# Patient Record
Sex: Female | Born: 1990 | Race: White | Hispanic: No | Marital: Married | Smoking: Former smoker
Health system: Southern US, Community
[De-identification: ages and names within clinical notes are randomized; demographics above are authoritative.]

## PROBLEM LIST (undated history)

## (undated) DIAGNOSIS — M3500 Sicca syndrome, unspecified: Secondary | ICD-10-CM

## (undated) DIAGNOSIS — N200 Calculus of kidney: Secondary | ICD-10-CM

## (undated) DIAGNOSIS — F429 Obsessive-compulsive disorder, unspecified: Secondary | ICD-10-CM

## (undated) DIAGNOSIS — K589 Irritable bowel syndrome without diarrhea: Secondary | ICD-10-CM

## (undated) DIAGNOSIS — E039 Hypothyroidism, unspecified: Secondary | ICD-10-CM

## (undated) DIAGNOSIS — R87629 Unspecified abnormal cytological findings in specimens from vagina: Secondary | ICD-10-CM

## (undated) DIAGNOSIS — N390 Urinary tract infection, site not specified: Secondary | ICD-10-CM

## (undated) DIAGNOSIS — R519 Headache, unspecified: Secondary | ICD-10-CM

## (undated) DIAGNOSIS — K2970 Gastritis, unspecified, without bleeding: Secondary | ICD-10-CM

## (undated) DIAGNOSIS — H539 Unspecified visual disturbance: Secondary | ICD-10-CM

## (undated) DIAGNOSIS — K219 Gastro-esophageal reflux disease without esophagitis: Secondary | ICD-10-CM

## (undated) DIAGNOSIS — R112 Nausea with vomiting, unspecified: Secondary | ICD-10-CM

## (undated) DIAGNOSIS — E079 Disorder of thyroid, unspecified: Secondary | ICD-10-CM

## (undated) DIAGNOSIS — J45909 Unspecified asthma, uncomplicated: Secondary | ICD-10-CM

## (undated) DIAGNOSIS — R51 Headache: Secondary | ICD-10-CM

## (undated) DIAGNOSIS — F419 Anxiety disorder, unspecified: Secondary | ICD-10-CM

## (undated) DIAGNOSIS — O139 Gestational [pregnancy-induced] hypertension without significant proteinuria, unspecified trimester: Secondary | ICD-10-CM

## (undated) DIAGNOSIS — Z9889 Other specified postprocedural states: Secondary | ICD-10-CM

## (undated) DIAGNOSIS — Z8489 Family history of other specified conditions: Secondary | ICD-10-CM

## (undated) DIAGNOSIS — D649 Anemia, unspecified: Secondary | ICD-10-CM

## (undated) DIAGNOSIS — K449 Diaphragmatic hernia without obstruction or gangrene: Secondary | ICD-10-CM

## (undated) DIAGNOSIS — N83209 Unspecified ovarian cyst, unspecified side: Secondary | ICD-10-CM

## (undated) DIAGNOSIS — F509 Eating disorder, unspecified: Secondary | ICD-10-CM

## (undated) DIAGNOSIS — T7840XA Allergy, unspecified, initial encounter: Secondary | ICD-10-CM

## (undated) HISTORY — PX: CERVIX LESION DESTRUCTION: SHX591

## (undated) HISTORY — PX: TONSILLECTOMY: SUR1361

## (undated) HISTORY — PX: ADENOIDECTOMY: SUR15

## (undated) HISTORY — PX: COLONOSCOPY: SHX174

## (undated) HISTORY — PX: KNEE ARTHROSCOPY: SUR90

## (undated) HISTORY — PX: KIDNEY STONE SURGERY: SHX686

## (undated) HISTORY — DX: Gastro-esophageal reflux disease without esophagitis: K21.9

## (undated) HISTORY — DX: Unspecified asthma, uncomplicated: J45.909

## (undated) HISTORY — DX: Other specified postprocedural states: Z98.890

## (undated) HISTORY — DX: Sjogren syndrome, unspecified: M35.00

## (undated) HISTORY — DX: Gastritis, unspecified, without bleeding: K29.70

## (undated) HISTORY — DX: Gestational (pregnancy-induced) hypertension without significant proteinuria, unspecified trimester: O13.9

## (undated) HISTORY — DX: Allergy, unspecified, initial encounter: T78.40XA

## (undated) HISTORY — PX: KNEE SURGERY: SHX244

## (undated) HISTORY — DX: Nausea with vomiting, unspecified: R11.2

## (undated) HISTORY — DX: Irritable bowel syndrome, unspecified: K58.9

## (undated) HISTORY — DX: Headache: R51

## (undated) HISTORY — DX: Unspecified abnormal cytological findings in specimens from vagina: R87.629

## (undated) HISTORY — PX: CYSTOSCOPY: SUR368

## (undated) HISTORY — DX: Family history of other specified conditions: Z84.89

## (undated) HISTORY — DX: Hypothyroidism, unspecified: E03.9

## (undated) HISTORY — DX: Diaphragmatic hernia without obstruction or gangrene: K44.9

## (undated) HISTORY — DX: Eating disorder, unspecified: F50.9

## (undated) HISTORY — DX: Disorder of thyroid, unspecified: E07.9

## (undated) HISTORY — DX: Urinary tract infection, site not specified: N39.0

## (undated) HISTORY — DX: Headache, unspecified: R51.9

## (undated) HISTORY — DX: Unspecified visual disturbance: H53.9

## (undated) HISTORY — DX: Calculus of kidney: N20.0

## (undated) HISTORY — DX: Obsessive-compulsive disorder, unspecified: F42.9

## (undated) HISTORY — DX: Anxiety disorder, unspecified: F41.9

## (undated) HISTORY — PX: ESOPHAGOGASTRODUODENOSCOPY: SHX1529

---
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
## 2014-08-11 ENCOUNTER — Emergency Department (HOSPITAL_COMMUNITY): Payer: BLUE CROSS/BLUE SHIELD

## 2014-08-11 ENCOUNTER — Encounter (HOSPITAL_COMMUNITY): Payer: Self-pay

## 2014-08-11 ENCOUNTER — Emergency Department (HOSPITAL_COMMUNITY)
Admission: EM | Admit: 2014-08-11 | Discharge: 2014-08-12 | Disposition: A | Discharge status: Home / Self Care | Payer: BLUE CROSS/BLUE SHIELD | Attending: Emergency Medicine | Admitting: Emergency Medicine

## 2014-08-11 DIAGNOSIS — Z8742 Personal history of other diseases of the female genital tract: Secondary | ICD-10-CM | POA: Insufficient documentation

## 2014-08-11 DIAGNOSIS — Z9889 Other specified postprocedural states: Secondary | ICD-10-CM | POA: Diagnosis not present

## 2014-08-11 DIAGNOSIS — Z3202 Encounter for pregnancy test, result negative: Secondary | ICD-10-CM | POA: Diagnosis not present

## 2014-08-11 DIAGNOSIS — Z79899 Other long term (current) drug therapy: Secondary | ICD-10-CM | POA: Insufficient documentation

## 2014-08-11 DIAGNOSIS — R102 Pelvic and perineal pain unspecified side: Secondary | ICD-10-CM

## 2014-08-11 DIAGNOSIS — Z720 Tobacco use: Secondary | ICD-10-CM | POA: Insufficient documentation

## 2014-08-11 DIAGNOSIS — N739 Female pelvic inflammatory disease, unspecified: Secondary | ICD-10-CM | POA: Diagnosis not present

## 2014-08-11 DIAGNOSIS — Z792 Long term (current) use of antibiotics: Secondary | ICD-10-CM | POA: Diagnosis not present

## 2014-08-11 DIAGNOSIS — N730 Acute parametritis and pelvic cellulitis: Secondary | ICD-10-CM

## 2014-08-11 HISTORY — DX: Unspecified ovarian cyst, unspecified side: N83.209

## 2014-08-11 LAB — URINE MICROSCOPIC-ADD ON

## 2014-08-11 LAB — CBC WITH DIFFERENTIAL/PLATELET
Basophils Absolute: 0 10*3/uL (ref 0.0–0.1)
Basophils Relative: 0 % (ref 0–1)
Eosinophils Absolute: 0 10*3/uL (ref 0.0–0.7)
Eosinophils Relative: 1 % (ref 0–5)
HCT: 38 % (ref 36.0–46.0)
Hemoglobin: 12.3 g/dL (ref 12.0–15.0)
Lymphocytes Relative: 30 % (ref 12–46)
Lymphs Abs: 1.1 10*3/uL (ref 0.7–4.0)
MCH: 28.2 pg (ref 26.0–34.0)
MCHC: 32.4 g/dL (ref 30.0–36.0)
MCV: 87.2 fL (ref 78.0–100.0)
Monocytes Absolute: 0.5 10*3/uL (ref 0.1–1.0)
Monocytes Relative: 13 % — ABNORMAL HIGH (ref 3–12)
Neutro Abs: 2.1 10*3/uL (ref 1.7–7.7)
Neutrophils Relative %: 56 % (ref 43–77)
Platelets: 213 10*3/uL (ref 150–400)
RBC: 4.36 MIL/uL (ref 3.87–5.11)
RDW: 13 % (ref 11.5–15.5)
WBC: 3.8 10*3/uL — ABNORMAL LOW (ref 4.0–10.5)

## 2014-08-11 LAB — I-STAT CHEM 8, ED
BUN: 12 mg/dL (ref 6–20)
Calcium, Ion: 1.17 mmol/L (ref 1.12–1.23)
Chloride: 104 mmol/L (ref 101–111)
Creatinine, Ser: 0.7 mg/dL (ref 0.44–1.00)
Glucose, Bld: 93 mg/dL (ref 65–99)
HCT: 40 % (ref 36.0–46.0)
Hemoglobin: 13.6 g/dL (ref 12.0–15.0)
Potassium: 3.3 mmol/L — ABNORMAL LOW (ref 3.5–5.1)
Sodium: 139 mmol/L (ref 135–145)
TCO2: 24 mmol/L (ref 0–100)

## 2014-08-11 LAB — WET PREP, GENITAL
Clue Cells Wet Prep HPF POC: NONE SEEN
Trich, Wet Prep: NONE SEEN

## 2014-08-11 LAB — URINALYSIS, ROUTINE W REFLEX MICROSCOPIC
Bilirubin Urine: NEGATIVE
Glucose, UA: NEGATIVE mg/dL
Ketones, ur: NEGATIVE mg/dL
Leukocytes, UA: NEGATIVE
Nitrite: NEGATIVE
Protein, ur: NEGATIVE mg/dL
Specific Gravity, Urine: 1.025 (ref 1.005–1.030)
Urobilinogen, UA: 0.2 mg/dL (ref 0.0–1.0)
pH: 6.5 (ref 5.0–8.0)

## 2014-08-11 LAB — POC URINE PREG, ED: Preg Test, Ur: NEGATIVE

## 2014-08-11 MED ORDER — HYDROMORPHONE HCL 1 MG/ML IJ SOLN
1.0000 mg | Freq: Once | INTRAMUSCULAR | Status: AC
  Administered 2014-08-11: 1 mg via INTRAMUSCULAR
  Filled 2014-08-11: qty 1

## 2014-08-11 NOTE — ED Notes (Signed)
Unable to collect labs at this time PA is working with patient.

## 2014-08-11 NOTE — ED Provider Notes (Signed)
CSN: [id number]     Arrival date & time 08/11/14  2031 History   First MD Initiated Contact with Patient 08/11/14 2056     Chief Complaint  Patient presents with  . Pelvic Pain     (Consider location/radiation/quality/duration/timing/severity/associated sxs/prior Treatment) HPI Michelle Waller is a 24 y.o. female with history of ovarian cyst, presents to emergency department complaining of pelvic pain. Patient states that symptoms started approximately 3 weeks ago. Patient states she had a new partner, she admits to having "rough intercourse." She states after that she felt swollen and had pelvic pain. She went to an urgent care and tested positive for chlamydia and was treated with Zithromax 1 g by mouth. Patient states she has also told she had UTI was treated with Cipro. Patient states that a week ago she was getting worse and went back to the urgent care again and was started on doxycycline at that time for possible PID. Patient states since then symptoms are just getting worse. She reports severe pelvic pain, worse on the left. She reports vaginal discharge. She reports vaginal irritation. She also reports urinary frequency and urgency. Denies fever or chills. Admits to nausea and vomiting.  Past Medical History  Diagnosis Date  . Ovarian cyst    Past Surgical History  Procedure Laterality Date  . Adenoidectomy    . Knee surgery    . Colonoscopy    . Cystoscopy     No family history on file. History  Substance Use Topics  . Smoking status: Current Some Day Smoker  . Smokeless tobacco: Not on file  . Alcohol Use: Yes     Comment: occasionally    OB History    No data available     Review of Systems  Constitutional: Negative for fever and chills.  Respiratory: Negative for cough, chest tightness and shortness of breath.   Cardiovascular: Negative for chest pain, palpitations and leg swelling.  Gastrointestinal: Positive for nausea, vomiting and abdominal pain. Negative for  diarrhea.  Genitourinary: Positive for dysuria, urgency, frequency, vaginal discharge, vaginal pain and pelvic pain. Negative for flank pain and vaginal bleeding.  Musculoskeletal: Negative for myalgias, arthralgias, neck pain and neck stiffness.  Skin: Negative for rash.  Neurological: Negative for dizziness, weakness and headaches.  All other systems reviewed and are negative.     Allergies  Medrol  Home Medications   Prior to Admission medications   Medication Sig Start Date End Date Taking? Authorizing Provider  doxycycline (VIBRAMYCIN) 100 MG capsule Take 100 mg by mouth 2 (two) times daily. 14 days 08/05/14 08/19/14 Yes Historical Provider, MD  EPIPEN 2-PAK 0.3 MG/0.3ML SOAJ injection USE SUBCUTANEOUSLY AS DIRECTED 05/28/14  Yes Historical Provider, MD  FLUoxetine (PROZAC) 20 MG capsule Take 20 mg by mouth every evening.   Yes Historical Provider, MD  HYDROcodone-acetaminophen (NORCO/VICODIN) 5-325 MG per tablet Take 0.5-1 tablets by mouth every 6 (six) hours as needed for moderate pain.  08/05/14  Yes Historical Provider, MD  LORazepam (ATIVAN) 0.5 MG tablet Take 0.25-0.5 mg by mouth every 8 (eight) hours as needed for anxiety.   Yes Historical Provider, MD  norgestimate-ethinyl estradiol (ORTHO-CYCLEN,SPRINTEC,PREVIFEM) 0.25-35 MG-MCG tablet Take 1 tablet by mouth every evening.   Yes Historical Provider, MD  omeprazole (PRILOSEC) 20 MG capsule Take 20 mg by mouth daily. 07/25/14  Yes Historical Provider, MD  tamsulosin (FLOMAX) 0.4 MG CAPS capsule Take 0.4 mg by mouth at bedtime as needed (kidney stone).  08/05/14  Yes Historical Provider, MD  BP 156/87 mmHg  Pulse 96  Temp(Src) 98.2 F (36.8 C) (Oral)  Resp 18  SpO2 100%  LMP 07/18/2014 (Approximate) Physical Exam  Constitutional: She appears well-developed and well-nourished. No distress.  HENT:  Head: Normocephalic.  Eyes: Conjunctivae are normal.  Neck: Neck supple.  Cardiovascular: Normal rate, regular rhythm and normal  heart sounds.   Pulmonary/Chest: Effort normal and breath sounds normal. No respiratory distress. She has no wheezes. She has no rales.  Abdominal: Soft. Bowel sounds are normal. She exhibits no distension. There is tenderness. There is no rebound.  Suprapubic tenderness  Genitourinary:  Normal external genitalia. White thick vaginal discharge. Cervix is erythematous, friable, tender to palpation, cervical motion tenderness present. Uterine tenderness present. Left adnexal tenderness. Right adnexa normal.  Musculoskeletal: She exhibits no edema.  Neurological: She is alert.  Skin: Skin is warm and dry.  Psychiatric: She has a normal mood and affect. Her behavior is normal.  Nursing note and vitals reviewed.   ED Course  Procedures (including critical care time) Labs Review Labs Reviewed  WET PREP, GENITAL - Abnormal; Notable for the following:    Yeast Wet Prep HPF POC FEW (*)    WBC, Wet Prep HPF POC MODERATE (*)    All other components within normal limits  CBC WITH DIFFERENTIAL/PLATELET - Abnormal; Notable for the following:    WBC 3.8 (*)    Monocytes Relative 13 (*)    All other components within normal limits  I-STAT CHEM 8, ED - Abnormal; Notable for the following:    Potassium 3.3 (*)    All other components within normal limits  URINALYSIS, ROUTINE W REFLEX MICROSCOPIC (NOT AT [facility])  POC URINE PREG, ED  GC/CHLAMYDIA PROBE AMP ([place]) NOT AT [facility]    Imaging Review US Transvaginal Non-ob  08/11/2014   CLINICAL DATA:  Acute onset of pelvic pain.  Initial encounter.  EXAM: TRANSABDOMINAL AND TRANSVAGINAL ULTRASOUND OF PELVIS  DOPPLER ULTRASOUND OF OVARIES  TECHNIQUE: Both transabdominal and transvaginal ultrasound examinations of the pelvis were performed. Transabdominal technique was performed for global imaging of the pelvis including uterus, ovaries, adnexal regions, and pelvic cul-de-sac.  It was necessary to proceed with endovaginal exam following the transabdominal  exam to visualize the ovaries in greater detail. Color and duplex Doppler ultrasound was utilized to evaluate blood flow to the ovaries.  COMPARISON:  None.  FINDINGS: Uterus  Measurements: 7.6 x 3.1 x 4.6 cm. No fibroids or other mass visualized. There is diffuse prominence of the vasculature within the myometrium and about the cervix.  Endometrium  Thickness: 0.3 cm.  No focal abnormality visualized.  Right ovary  Measurements: 2.3 x 1.8 x 1.5 cm. Normal appearance/no adnexal mass.  Left ovary  Measurements: 4.2 x 2.0 x 2.2 cm. Normal appearance/no adnexal mass.  Pulsed Doppler evaluation of both ovaries demonstrates normal low-resistance arterial and venous waveforms.  Other findings  No free fluid is seen within the pelvic cul-de-sac.  IMPRESSION: 1. No acute abnormality seen within the pelvis. No evidence for ovarian torsion. 2. Diffuse nonspecific prominence of the myometrial and periuterine vasculature.   Electronically Signed   By: Garald Balding M.D.   On: 08/11/2014 23:19   US Pelvis Complete  08/11/2014   CLINICAL DATA:  Acute onset of pelvic pain.  Initial encounter.  EXAM: TRANSABDOMINAL AND TRANSVAGINAL ULTRASOUND OF PELVIS  DOPPLER ULTRASOUND OF OVARIES  TECHNIQUE: Both transabdominal and transvaginal ultrasound examinations of the pelvis were performed. Transabdominal technique was performed for global imaging  of the pelvis including uterus, ovaries, adnexal regions, and pelvic cul-de-sac.  It was necessary to proceed with endovaginal exam following the transabdominal exam to visualize the ovaries in greater detail. Color and duplex Doppler ultrasound was utilized to evaluate blood flow to the ovaries.  COMPARISON:  None.  FINDINGS: Uterus  Measurements: 7.6 x 3.1 x 4.6 cm. No fibroids or other mass visualized. There is diffuse prominence of the vasculature within the myometrium and about the cervix.  Endometrium  Thickness: 0.3 cm.  No focal abnormality visualized.  Right ovary  Measurements: 2.3  x 1.8 x 1.5 cm. Normal appearance/no adnexal mass.  Left ovary  Measurements: 4.2 x 2.0 x 2.2 cm. Normal appearance/no adnexal mass.  Pulsed Doppler evaluation of both ovaries demonstrates normal low-resistance arterial and venous waveforms.  Other findings  No free fluid is seen within the pelvic cul-de-sac.  IMPRESSION: 1. No acute abnormality seen within the pelvis. No evidence for ovarian torsion. 2. Diffuse nonspecific prominence of the myometrial and periuterine vasculature.   Electronically Signed   By: Garald Balding M.D.   On: 08/11/2014 23:19   [place] Art/ven Flow Abd Pelv Doppler  08/11/2014   CLINICAL DATA:  Acute onset of pelvic pain.  Initial encounter.  EXAM: TRANSABDOMINAL AND TRANSVAGINAL ULTRASOUND OF PELVIS  DOPPLER ULTRASOUND OF OVARIES  TECHNIQUE: Both transabdominal and transvaginal ultrasound examinations of the pelvis were performed. Transabdominal technique was performed for global imaging of the pelvis including uterus, ovaries, adnexal regions, and pelvic cul-de-sac.  It was necessary to proceed with endovaginal exam following the transabdominal exam to visualize the ovaries in greater detail. Color and duplex Doppler ultrasound was utilized to evaluate blood flow to the ovaries.  COMPARISON:  None.  FINDINGS: Uterus  Measurements: 7.6 x 3.1 x 4.6 cm. No fibroids or other mass visualized. There is diffuse prominence of the vasculature within the myometrium and about the cervix.  Endometrium  Thickness: 0.3 cm.  No focal abnormality visualized.  Right ovary  Measurements: 2.3 x 1.8 x 1.5 cm. Normal appearance/no adnexal mass.  Left ovary  Measurements: 4.2 x 2.0 x 2.2 cm. Normal appearance/no adnexal mass.  Pulsed Doppler evaluation of both ovaries demonstrates normal low-resistance arterial and venous waveforms.  Other findings  No free fluid is seen within the pelvic cul-de-sac.  IMPRESSION: 1. No acute abnormality seen within the pelvis. No evidence for ovarian torsion. 2. Diffuse  nonspecific prominence of the myometrial and periuterine vasculature.   Electronically Signed   By: Garald Balding M.D.   On: 08/11/2014 23:19     EKG Interpretation None      MDM   Final diagnoses:  Adnexal pain    patient with pelvic pain, vaginal discharge. Currently finished 1 week of doxycycline and was treated with Zithromax for chlamydia prior to that. Patient is afebrile, nontoxic appearing. Vaginal discharge concerning for infection. Will get ultrasound to rule out TOA.  Ultrasound is negative. Patient's urinalysis is negative. Blood unremarkable. Wet prep with ease and many white blood cells. I suspect PID. She states she received Rocephin IM yesterday. She has received another doxycycline prescription which she has not filled via yesterday. We'll continue doxycycline, will add Flagyl, will give pain medication, Percocet. Instructed to follow-up with OB/GYN. Return or go to [facility] of worsening. Patient was resting. She is nontoxic appearing, she is afebrile. No elevation of WBC. No evidence of sepsis. It do not think she needs admission at this time.  Filed Vitals:   08/11/14 2038 08/12/14  0008 08/12/14 0040  BP: 156/87 139/82 137/83  Pulse: 96 64 78  Temp: 98.2 F (36.8 C)  98.2 F (36.8 C)  TempSrc: Oral  Oral  Resp: 18 15 14   SpO2: 100% 98% 100%     Jeannett Senior, PA-C 08/12/14 0106  Gareth Morgan, MD 08/12/14 1530

## 2014-08-11 NOTE — ED Notes (Signed)
Pt presents with c/o pelvic pain. Pt reports that approx 3 weeks ago, she had a new partner and had some rough intercourse. Pt was seen at [facility] because she felt she was swollen and was diagnosed with chlamydia. Pt returned to [facility] because the pain was not any better and they believed she has PID. Pt reports the pain is not any better.

## 2014-08-12 LAB — GC/CHLAMYDIA PROBE AMP (~~LOC~~) NOT AT ARMC
Chlamydia: NEGATIVE
Neisseria Gonorrhea: NEGATIVE

## 2014-08-12 MED ORDER — METRONIDAZOLE 500 MG PO TABS: 500.0000 mg | ORAL_TABLET | Freq: Two times a day (BID) | ORAL | Status: DC

## 2014-08-12 MED ORDER — OXYCODONE-ACETAMINOPHEN 5-325 MG PO TABS: 1.0000 | ORAL_TABLET | ORAL | Status: DC | PRN

## 2014-08-12 MED ORDER — FLUCONAZOLE 150 MG PO TABS
150.0000 mg | ORAL_TABLET | Freq: Once | ORAL | Status: AC
  Administered 2014-08-12: 150 mg via ORAL
  Filled 2014-08-12: qty 1

## 2014-08-12 MED ORDER — FLUCONAZOLE 150 MG PO TABS: 150.0000 mg | ORAL_TABLET | Freq: Every day | ORAL | Status: AC

## 2014-08-12 NOTE — Discharge Instructions (Signed)
Continue doxycycline. Add metronidazole to the treatment and take until all gone. Take percocet as prescribed as needed for severe pain. Please follow up with OB/GYN. Return to [place] if worsening symptoms.   Pelvic Inflammatory Disease Pelvic inflammatory disease (PID) refers to an infection in some or all of the female organs. The infection can be in the uterus, ovaries, fallopian tubes, or the surrounding tissues in the pelvis. PID can cause abdominal or pelvic pain that comes on suddenly (acute pelvic pain). PID is a serious infection because it can lead to lasting (chronic) pelvic pain or the inability to have children (infertile).  CAUSES  The infection is often caused by the normal bacteria found in the vaginal tissues. PID may also be caused by an infection that is spread during sexual contact. PID can also occur following:   The birth of a baby.   A miscarriage.   An abortion.   Major pelvic surgery.   The use of an intrauterine device (IUD).   A sexual assault.  RISK FACTORS Certain factors can put a person at higher risk for PID, such as:  Being younger than 25 years.  Being sexually active at [place] age.  Usingnonbarrier contraception.  Havingmultiple sexual partners.  Having sex with someone who has symptoms of a genital infection.  Using oral contraception. Other times, certain behaviors can increase the possibility of getting PID, such as:  Having sex during your period.  Using a vaginal douche.  Having an intrauterine device (IUD) in place. SYMPTOMS   Abdominal or pelvic pain.   Fever.   Chills.   Abnormal vaginal discharge.  Abnormal uterine bleeding.   Unusual pain shortly after finishing your period. DIAGNOSIS  Your caregiver will choose some of the following methods to make a diagnosis, such as:   Performinga physical exam and history. A pelvic exam typically reveals a very tender uterus and surrounding pelvis.    Ordering laboratory tests including a pregnancy test, blood tests, and urine test.  Orderingcultures of the vagina and cervix to check for a sexually transmitted infection (STI).  Performing an ultrasound.   Performing a laparoscopic procedure to look inside the pelvis.  TREATMENT   Antibiotic medicines may be prescribed and taken by mouth.   Sexual partners may be treated when the infection is caused by a sexually transmitted disease (STD).   Hospitalization may be needed to give antibiotics intravenously.  Surgery may be needed, but this is rare. It may take weeks until you are completely well. If you are diagnosed with PID, you should also be checked for human immunodeficiency virus (HIV). HOME CARE INSTRUCTIONS   If given, take your antibiotics as directed. Finish the medicine even if you start to feel better.   Only take over-the-counter or prescription medicines for pain, discomfort, or fever as directed by your caregiver.   Do not have sexual intercourse until treatment is completed or as directed by your caregiver. If PID is confirmed, your recent sexual partner(s) will need treatment.   Keep your follow-up appointments. SEEK MEDICAL CARE IF:   You have increased or abnormal vaginal discharge.   You need prescription medicine for your pain.   You vomit.   You cannot take your medicines.   Your partner has an STD.  SEEK IMMEDIATE MEDICAL CARE IF:   You have a fever.   You have increased abdominal or pelvic pain.   You have chills.   You have pain when you urinate.   You are not  better after 72 hours following treatment.  MAKE SURE YOU:   Understand these instructions.  Will watch your condition.  Will get help right away if you are not doing well or get worse. Document Released: 01/09/2005 Document Revised: 05/06/2012 Document Reviewed: 01/05/2011 [facility] Patient Information 2015 [place], [place]. This information is not  intended to replace advice given to you by your health care provider. Make sure you discuss any questions you have with your health care provider.

## 2014-08-15 ENCOUNTER — Encounter (HOSPITAL_COMMUNITY): Payer: Self-pay | Admitting: Emergency Medicine

## 2014-08-15 ENCOUNTER — Emergency Department (HOSPITAL_COMMUNITY): Payer: BLUE CROSS/BLUE SHIELD

## 2014-08-15 ENCOUNTER — Emergency Department (HOSPITAL_COMMUNITY)
Admission: EM | Admit: 2014-08-15 | Discharge: 2014-08-16 | Disposition: A | Discharge status: Home / Self Care | Payer: BLUE CROSS/BLUE SHIELD | Attending: Emergency Medicine | Admitting: Emergency Medicine

## 2014-08-15 DIAGNOSIS — Z720 Tobacco use: Secondary | ICD-10-CM | POA: Insufficient documentation

## 2014-08-15 DIAGNOSIS — R300 Dysuria: Secondary | ICD-10-CM | POA: Diagnosis present

## 2014-08-15 DIAGNOSIS — Z79899 Other long term (current) drug therapy: Secondary | ICD-10-CM | POA: Insufficient documentation

## 2014-08-15 DIAGNOSIS — Z792 Long term (current) use of antibiotics: Secondary | ICD-10-CM | POA: Insufficient documentation

## 2014-08-15 DIAGNOSIS — R52 Pain, unspecified: Secondary | ICD-10-CM

## 2014-08-15 DIAGNOSIS — Z87442 Personal history of urinary calculi: Secondary | ICD-10-CM | POA: Insufficient documentation

## 2014-08-15 DIAGNOSIS — Z3202 Encounter for pregnancy test, result negative: Secondary | ICD-10-CM | POA: Diagnosis not present

## 2014-08-15 DIAGNOSIS — Z87440 Personal history of urinary (tract) infections: Secondary | ICD-10-CM | POA: Insufficient documentation

## 2014-08-15 DIAGNOSIS — Z8742 Personal history of other diseases of the female genital tract: Secondary | ICD-10-CM | POA: Diagnosis not present

## 2014-08-15 DIAGNOSIS — R109 Unspecified abdominal pain: Secondary | ICD-10-CM | POA: Insufficient documentation

## 2014-08-15 LAB — URINALYSIS, ROUTINE W REFLEX MICROSCOPIC
Bilirubin Urine: NEGATIVE
Glucose, UA: NEGATIVE mg/dL
Ketones, ur: NEGATIVE mg/dL
Nitrite: NEGATIVE
Protein, ur: NEGATIVE mg/dL
Specific Gravity, Urine: 1.009 (ref 1.005–1.030)
Urobilinogen, UA: 0.2 mg/dL (ref 0.0–1.0)
pH: 6 (ref 5.0–8.0)

## 2014-08-15 LAB — URINE MICROSCOPIC-ADD ON

## 2014-08-15 LAB — POC URINE PREG, ED: Preg Test, Ur: NEGATIVE

## 2014-08-15 MED ORDER — KETOROLAC TROMETHAMINE 60 MG/2ML IM SOLN
60.0000 mg | Freq: Once | INTRAMUSCULAR | Status: AC
  Administered 2014-08-16: 60 mg via INTRAMUSCULAR
  Filled 2014-08-15: qty 2

## 2014-08-15 MED ORDER — METHOCARBAMOL 500 MG PO TABS
1000.0000 mg | ORAL_TABLET | Freq: Once | ORAL | Status: AC
  Administered 2014-08-16: 1000 mg via ORAL
  Filled 2014-08-15: qty 2

## 2014-08-15 MED ORDER — DICYCLOMINE HCL 10 MG/ML IM SOLN
20.0000 mg | Freq: Once | INTRAMUSCULAR | Status: AC
  Administered 2014-08-16: 20 mg via INTRAMUSCULAR
  Filled 2014-08-15: qty 2

## 2014-08-15 NOTE — ED Provider Notes (Signed)
CSN: [id number]     Arrival date & time 08/15/14  2138 History  This chart was scribed for [name], MD by Randa Evens, ED Scribe. This patient was seen in room WA07/WA07 and the patient's care was started at 11:08 PM.    Chief Complaint  Patient presents with  . Nephrolithiasis  . Urinary Tract Infection  . Dysuria   Patient is a 24 y.o. female presenting with dysuria. The history is provided by the patient. No language interpreter was used.  Dysuria Pain quality:  Sharp and shooting Pain severity:  Moderate Onset quality:  Gradual Timing:  Intermittent Progression:  Unchanged Chronicity:  New Recent urinary tract infections: yes   Relieved by:  Nothing Ineffective treatments:  Antibiotics Urinary symptoms: no hematuria   Associated symptoms: flank pain   Associated symptoms: no fever, no genital lesions, no nausea, no vaginal discharge and no vomiting   Risk factors: recurrent urinary tract infections    HPI Comments: Michelle Waller is a 24 y.o. female who presents to the Emergency Department complaining of intermittent bladder irritation onset several weeks. She describes the pain as intetrmtitent sharp and shooting.  Pt does report recently being diagnosed with UTI that she was prescribned cipro with no relief. Pt states that the pain is radiating down her left leg. She does report Hx of kidney stones on her right side. Pt states that she previously had to have a cystoscopy and that her pain feels similar this time. Pt does report diarrhea. Pt states states that she has been taking hydrocodone and percocet with no relief. Seen here on 7/19 and diagnosed with PID which patient disagrees with.  Has seen urology as she thinks this is a kidney stone but they did not feel it was a stone.  No f/c/r.  No weakness nor numbness no bowel or bladder incontinence.    Past Medical History  Diagnosis Date  . Ovarian cyst    Past Surgical History  Procedure Laterality Date  .  Adenoidectomy    . Knee surgery    . Colonoscopy    . Cystoscopy     History reviewed. No pertinent family history. History  Substance Use Topics  . Smoking status: Current Some Day Smoker  . Smokeless tobacco: Not on file  . Alcohol Use: Yes     Comment: occasionally    OB History    No data available     Review of Systems  Constitutional: Negative for fever.  Gastrointestinal: Negative for nausea, vomiting, diarrhea, constipation and blood in stool.  Genitourinary: Positive for dysuria and flank pain. Negative for vaginal bleeding and vaginal discharge.  All other systems reviewed and are negative.     Allergies  Soy allergy and Medrol  Home Medications   Prior to Admission medications   Medication Sig Start Date End Date Taking? Authorizing Provider  ciprofloxacin (CIPRO) 500 MG tablet Take 500 mg by mouth 2 (two) times daily.   Yes Historical Provider, MD  FLUoxetine (PROZAC) 20 MG capsule Take 20 mg by mouth every evening.   Yes Historical Provider, MD  HYDROcodone-acetaminophen (NORCO/VICODIN) 5-325 MG per tablet Take 0.5-1 tablets by mouth every 6 (six) hours as needed for moderate pain.  08/05/14  Yes Historical Provider, MD  LORazepam (ATIVAN) 0.5 MG tablet Take 0.25-0.5 mg by mouth every 8 (eight) hours as needed for anxiety.   Yes Historical Provider, MD  metroNIDAZOLE (FLAGYL) 500 MG tablet Take 1 tablet (500 mg total) by mouth 2 (two) times  daily. 08/12/14  Yes Tatyana Kirichenko, PA-C  norgestimate-ethinyl estradiol (ORTHO-CYCLEN,SPRINTEC,PREVIFEM) 0.25-35 MG-MCG tablet Take 1 tablet by mouth every evening.   Yes Historical Provider, MD  omeprazole (PRILOSEC) 20 MG capsule Take 20 mg by mouth daily. 07/25/14  Yes Historical Provider, MD  oxyCODONE-acetaminophen (PERCOCET) 5-325 MG per tablet Take 1 tablet by mouth every 4 (four) hours as needed for severe pain. 08/12/14  Yes Tatyana Kirichenko, PA-C  tamsulosin (FLOMAX) 0.4 MG CAPS capsule Take 0.4 mg by mouth at  bedtime as needed (kidney stone).  08/05/14  Yes Historical Provider, MD  doxycycline (VIBRAMYCIN) 100 MG capsule Take 100 mg by mouth 2 (two) times daily. 14 days 08/05/14 08/19/14  Historical Provider, MD  EPIPEN 2-PAK 0.3 MG/0.3ML SOAJ injection USE SUBCUTANEOUSLY AS DIRECTED 05/28/14   Historical Provider, MD  fluconazole (DIFLUCAN) 150 MG tablet Take 1 tablet (150 mg total) by mouth daily. Patient not taking: Reported on 08/15/2014 08/12/14 08/19/14  Tatyana Kirichenko, PA-C   BP 127/76 mmHg  Pulse 70  Temp(Src) 98.1 F (36.7 C) (Oral)  Resp 18  SpO2 100%  LMP 07/18/2014 (Approximate)   Physical Exam  Constitutional: She is oriented to person, place, and time. She appears well-developed and well-nourished. No distress.  HENT:  Head: Normocephalic and atraumatic.  Mouth/Throat: Oropharynx is clear and moist. No oropharyngeal exudate.  Eyes: Conjunctivae and EOM are normal. Pupils are equal, round, and reactive to light.  Neck: Normal range of motion. Neck supple. No tracheal deviation present.  Cardiovascular: Normal rate, regular rhythm and normal heart sounds.   Pulmonary/Chest: Effort normal and breath sounds normal. No respiratory distress. She has no wheezes. She has no rales.  Abdominal: Soft. She exhibits no distension and no mass. Bowel sounds are increased. There is no tenderness. There is no rebound and no guarding.  Hyperactive bowel sounds throughout.   Musculoskeletal: Normal range of motion. She exhibits no edema or tenderness.  Neurological: She is alert and oriented to person, place, and time. She has normal reflexes.  Skin: Skin is warm and dry. She is not diaphoretic.  Psychiatric: She has a normal mood and affect. Her behavior is normal.  Nursing note and vitals reviewed.   ED Course  Procedures (including critical care time) DIAGNOSTIC STUDIES: Oxygen Saturation is 100% on RA, normal by my interpretation.    COORDINATION OF CARE: 1:00 AM-Discussed treatment plan  with pt at bedside and pt agreed to plan.     Labs Review Labs Reviewed  URINALYSIS, ROUTINE W REFLEX MICROSCOPIC (NOT AT [facility]) - Abnormal; Notable for the following:    Hgb urine dipstick MODERATE (*)    Leukocytes, UA TRACE (*)    All other components within normal limits  URINE MICROSCOPIC-ADD ON - Abnormal; Notable for the following:    Squamous Epithelial / LPF FEW (*)    All other components within normal limits  POC URINE PREG, ED    Imaging Review Ct Renal Stone Study  08/16/2014   CLINICAL DATA:  24 year old female with left-sided abdominal pain.  EXAM: CT ABDOMEN AND PELVIS WITHOUT CONTRAST  TECHNIQUE: Multidetector CT imaging of the abdomen and pelvis was performed following the standard protocol without IV contrast.  COMPARISON:  None.  FINDINGS: Evaluation of this exam is limited in the absence of intravenous contrast.  Sixty the visualized lungs are clear. No intra-abdominal free air or free fluid.  The liver, gallbladder this, pancreas, spleen, adrenal glands appear unremarkable. Punctate nonobstructing left renal upper pole calculus may be present. There are  multiple 2- 3 mm nonobstructing right renal calculi. There is no hydronephrosis on either side. The visualized ureters and urinary bladder appear unremarkable. Set the uterus is anteverted and grossly unremarkable.  Moderate stool throughout the colon. No evidence of bowel obstruction or inflammation. Normal appendix.  The visualized abdominal aorta and IVC appear unremarkable. No portal venous gas identified. There is no lymphadenopathy. Is a small fat containing umbilical hernia. The osseous structures are intact.  IMPRESSION: No hydronephrosis or obstructing stone. Small nonobstructing bilateral renal calculi.  Constipation. No evidence of bowel obstruction or inflammation. Normal appendix.   Electronically Signed   By: Anner Crete M.D.   On: 08/16/2014 00:51     EKG Interpretation None      MDM   Well  appearing exam, vital signs reassuring recently seen had normal blood workup and treated for PID. Treated by urology that couldn't identify source of pain. No indication for repeat ultra sound or repeat pelvic exam or imaging as these were just performed and treatment was initiated. CT of abdomen and pelvic were normal . [place]A database reviewed pt has already received 2 narcotic prescriptions for the month of July on the 13th and 20th. Will discharge with NSAID prescription.   Final diagnoses:  Pain   No findings on CT scan to explain patient's symptoms.  Exam is benign.    Have added NSAID and muscle relaxant to patient's current pain regimen.  Continue doxycycline as previously directed and Follow up with GYN for ongoing care.   I personally performed the services described in this documentation, which was scribed in my presence. The recorded information has been reviewed and is accurate.      Michelle Kells, MD 08/16/14 [phone]

## 2014-08-15 NOTE — ED Notes (Signed)
Patient recently diagnosed with a UTI on July 6th. Is not on second round of Ciprofloxacin antibiotic treatment. Has hx recurrent kidney stones. Says last time they were unable to see a kidney stone lodged in her ureter-only found this when they went in to surgically perform a cystoscopy. Was told she has kidney stones on the right but only feels a random "twinge" of pain. Says her severe pain is in the left lower flank and left groin. Having dysuria and burning during urination. Says she is having trouble starting a stream of urine. Took oxycodone around 6 hours ago with no alleviation of symptoms. No other c/c.

## 2014-08-16 ENCOUNTER — Encounter (HOSPITAL_COMMUNITY): Payer: Self-pay | Admitting: Emergency Medicine

## 2014-08-16 MED ORDER — MELOXICAM 15 MG PO TABS: 15.0000 mg | ORAL_TABLET | Freq: Every day | ORAL | Status: DC

## 2014-08-16 MED ORDER — INSULIN ASPART PROT & ASPART (70-30 MIX) 100 UNIT/ML ~~LOC~~ SUSP: 3.0000 [IU] | Freq: Once | SUBCUTANEOUS | Status: DC

## 2014-08-16 MED ORDER — METHOCARBAMOL 500 MG PO TABS: 500.0000 mg | ORAL_TABLET | Freq: Two times a day (BID) | ORAL | Status: DC

## 2014-08-16 MED ORDER — DOXYCYCLINE HYCLATE 100 MG PO TABS: 100.0000 mg | ORAL_TABLET | Freq: Once | ORAL | Status: DC

## 2014-08-16 MED ORDER — PHENAZOPYRIDINE HCL 200 MG PO TABS
200.0000 mg | ORAL_TABLET | Freq: Three times a day (TID) | ORAL | Status: DC
  Administered 2014-08-16: 200 mg via ORAL
  Filled 2014-08-16: qty 1

## 2014-08-20 ENCOUNTER — Encounter: Payer: Self-pay | Admitting: Medical

## 2014-08-20 ENCOUNTER — Ambulatory Visit (INDEPENDENT_AMBULATORY_CARE_PROVIDER_SITE_OTHER): Payer: BLUE CROSS/BLUE SHIELD | Admitting: Medical

## 2014-08-20 VITALS — BP 140/87 | HR 75 | Temp 99.0°F | Ht 63.0 in | Wt 134.3 lb

## 2014-08-20 DIAGNOSIS — R102 Pelvic and perineal pain: Secondary | ICD-10-CM | POA: Diagnosis not present

## 2014-08-20 LAB — POCT URINALYSIS DIP (DEVICE)
Bilirubin Urine: NEGATIVE
Glucose, UA: NEGATIVE mg/dL
Ketones, ur: NEGATIVE mg/dL
Nitrite: NEGATIVE
Protein, ur: NEGATIVE mg/dL
Specific Gravity, Urine: >=1.03 (ref 1.005–1.030)
Urobilinogen, UA: 0.2 mg/dL (ref 0.0–1.0)
pH: 6 (ref 5.0–8.0)

## 2014-08-20 NOTE — Patient Instructions (Signed)
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
## 2014-08-21 LAB — URINE CULTURE
Colony Count: NO GROWTH
Organism ID, Bacteria: NO GROWTH

## 2014-09-01 ENCOUNTER — Other Ambulatory Visit: Payer: Self-pay | Admitting: Obstetrics & Gynecology

## 2014-09-02 LAB — CYTOLOGY - PAP

## 2015-02-05 DIAGNOSIS — M7552 Bursitis of left shoulder: Secondary | ICD-10-CM | POA: Diagnosis not present

## 2015-02-05 DIAGNOSIS — S43432A Superior glenoid labrum lesion of left shoulder, initial encounter: Secondary | ICD-10-CM | POA: Diagnosis not present

## 2015-02-05 DIAGNOSIS — S43432D Superior glenoid labrum lesion of left shoulder, subsequent encounter: Secondary | ICD-10-CM | POA: Diagnosis not present

## 2015-02-11 ENCOUNTER — Encounter: Payer: Self-pay | Admitting: Internal Medicine

## 2015-02-11 ENCOUNTER — Ambulatory Visit (INDEPENDENT_AMBULATORY_CARE_PROVIDER_SITE_OTHER): Payer: 59 | Admitting: Internal Medicine

## 2015-02-11 VITALS — BP 132/80 | HR 85 | Temp 98.5°F | Resp 12 | Ht 62.0 in | Wt 137.0 lb

## 2015-02-11 DIAGNOSIS — L709 Acne, unspecified: Secondary | ICD-10-CM | POA: Diagnosis not present

## 2015-02-11 DIAGNOSIS — K219 Gastro-esophageal reflux disease without esophagitis: Secondary | ICD-10-CM | POA: Diagnosis not present

## 2015-02-11 DIAGNOSIS — R51 Headache: Secondary | ICD-10-CM

## 2015-02-11 DIAGNOSIS — F410 Panic disorder [episodic paroxysmal anxiety] without agoraphobia: Secondary | ICD-10-CM | POA: Diagnosis not present

## 2015-02-11 DIAGNOSIS — N200 Calculus of kidney: Secondary | ICD-10-CM

## 2015-02-11 DIAGNOSIS — R519 Headache, unspecified: Secondary | ICD-10-CM | POA: Insufficient documentation

## 2015-02-11 MED ORDER — OMEPRAZOLE 20 MG PO CPDR: 20.0000 mg | DELAYED_RELEASE_CAPSULE | Freq: Every day | ORAL | Status: DC

## 2015-02-11 MED ORDER — FLUOXETINE HCL 20 MG PO CAPS: 20.0000 mg | ORAL_CAPSULE | Freq: Every evening | ORAL | Status: DC

## 2015-02-11 MED ORDER — NADOLOL 20 MG PO TABS: 10.0000 mg | ORAL_TABLET | Freq: Every day | ORAL | Status: DC

## 2015-02-11 MED ORDER — MINOCYCLINE HCL 100 MG PO CAPS: 100.0000 mg | ORAL_CAPSULE | Freq: Every day | ORAL | Status: DC

## 2015-02-11 MED ORDER — LORAZEPAM 0.5 MG PO TABS: 0.2500 mg | ORAL_TABLET | Freq: Three times a day (TID) | ORAL | Status: DC | PRN

## 2015-02-11 NOTE — Assessment & Plan Note (Signed)
Prilosec 20 mg daily controlling her symptoms.

## 2015-02-11 NOTE — Progress Notes (Signed)
Pre visit review using our clinic review tool, if applicable. No additional management support is needed unless otherwise documented below in the visit note. 

## 2015-02-11 NOTE — Assessment & Plan Note (Signed)
Does try to drink a lot of water and thinks that they were calcium stones in the past. Has seen urology in town.

## 2015-02-11 NOTE — Progress Notes (Signed)
   Subjective:    Patient ID: Michelle Waller, female    DOB: 1990/09/22, 25 y.o.   MRN: [id number]  HPI The patient is a 25 YO female coming in new for several concerns. Her first concern is recurrent headaches. She has been on a beta blocker daily in the past which was good for prevention. She does have daily headaches right now. She is not under extra stress. Moved to [place] in the last several months for a job. She is able to take tylenol or ibuprofen for the headaches with good relief. No change to vision during the headaches. Her whole head hurts.  Next concern is her panic attacks. She has been maintained on prozac for some time with good success. She has ativan that she uses prn for panic attacks. Overall she is well controlled in this regard for some time. No extra stress with the move.  Next concern is her acne. She is currently taking daily medication for it and has been for some time. Has tried cream and gel in the past. Not sure why she was switched. Acne is well controlled at this time.  PMH, [facility], social history reviewed and updated.   Review of Systems  Constitutional: Negative for fever, activity change, appetite change, fatigue and unexpected weight change.  HENT: Negative for congestion, ear pain, postnasal drip, rhinorrhea, sinus pressure and sore throat.   Eyes: Negative.   Respiratory: Negative for cough, chest tightness, shortness of breath and wheezing.   Cardiovascular: Negative for chest pain, palpitations and leg swelling.  Gastrointestinal: Negative for nausea, abdominal pain, diarrhea, constipation and abdominal distention.  Musculoskeletal: Negative.   Skin: Negative.   Neurological: Positive for headaches. Negative for dizziness, syncope, weakness and light-headedness.  Psychiatric/Behavioral: Negative.       Objective:   Physical Exam  Constitutional: She is oriented to person, place, and time. She appears well-developed and well-nourished.  HENT:  Head:  Normocephalic and atraumatic.  Eyes: EOM are normal.  Neck: Normal range of motion.  Cardiovascular: Normal rate and regular rhythm.   No murmur heard. Pulmonary/Chest: Effort normal and breath sounds normal. No respiratory distress. She has no wheezes. She has no rales.  Abdominal: Soft. Bowel sounds are normal. She exhibits no distension. There is no tenderness. There is no rebound.  Musculoskeletal: She exhibits no edema.  Neurological: She is alert and oriented to person, place, and time. Coordination normal.  Skin: Skin is warm and dry.  Psychiatric: She has a normal mood and affect.   Filed Vitals:   02/11/15 1049  BP: 132/80  Pulse: 85  Temp: 98.5 F (36.9 C)  TempSrc: Oral  Resp: 12  Height: 5\' 2"  (1.575 m)  Weight: 137 lb (62.143 kg)  SpO2: 98%      Assessment & Plan:

## 2015-02-11 NOTE — Patient Instructions (Signed)
We have sent in the refills for you today.   We have also sent in the nadolol that you can take 1/2 pill daily for the headaches. If you have any problems with that please let us know.   Come back in about 6 months to check on the headaches.   Health Maintenance, Female Adopting a healthy lifestyle and getting preventive care can go a long way to promote health and wellness. Talk with your health care provider about what schedule of regular examinations is right for you. This is a good chance for you to check in with your provider about disease prevention and staying healthy. In between checkups, there are plenty of things you can do on your own. Experts have done a lot of research about which lifestyle changes and preventive measures are most likely to keep you healthy. Ask your health care provider for more information. WEIGHT AND DIET  Eat a healthy diet  Be sure to include plenty of vegetables, fruits, low-fat dairy products, and lean protein.  Do not eat a lot of foods high in solid fats, added sugars, or salt.  Get regular exercise. This is one of the most important things you can do for your health.  Most adults should exercise for at least 150 minutes each week. The exercise should increase your heart rate and make you sweat (moderate-intensity exercise).  Most adults should also do strengthening exercises at least twice a week. This is in addition to the moderate-intensity exercise.  Maintain a healthy weight  Body mass index (BMI) is a measurement that can be used to identify possible weight problems. It estimates body fat based on height and weight. Your health care provider can help determine your BMI and help you achieve or maintain a healthy weight.  For females 26 years of age and older:   A BMI below 18.5 is considered underweight.  A BMI of 18.5 to 24.9 is normal.  A BMI of 25 to 29.9 is considered overweight.  A BMI of 30 and above is considered obese.  Watch  levels of cholesterol and blood lipids  You should start having your blood tested for lipids and cholesterol at 26 years of age, then have this test every 5 years.  You may need to have your cholesterol levels checked more often if:  Your lipid or cholesterol levels are high.  You are older than 26 years of age.  You are at high risk for heart disease.  CANCER SCREENING   Lung Cancer  Lung cancer screening is recommended for adults 37-71 years old who are at high risk for lung cancer because of a history of smoking.  A yearly low-dose CT scan of the lungs is recommended for people who:  Currently smoke.  Have quit within the past 26 years.  Have at least a 30-pack-year history of smoking. A pack year is smoking an average of one pack of cigarettes a day for 1 year.  Yearly screening should continue until it has been 15 years since you quit.  Yearly screening should stop if you develop a health problem that would prevent you from having lung cancer treatment.  Breast Cancer  Practice breast self-awareness. This means understanding how your breasts normally appear and feel.  It also means doing regular breast self-exams. Let your health care provider know about any changes, no matter how small.  If you are in your 20s or 30s, you should have a clinical breast exam (CBE) by a health care  provider every 1-3 years as part of a regular health exam.  If you are 3 or older, have a CBE every year. Also consider having a breast X-ray (mammogram) every year.  If you have a family history of breast cancer, talk to your health care provider about genetic screening.  If you are at high risk for breast cancer, talk to your health care provider about having an MRI and a mammogram every year.  Breast cancer gene (BRCA) assessment is recommended for women who have family members with BRCA-related cancers. BRCA-related cancers include:  Breast.  Ovarian.  Tubal.  Peritoneal  cancers.  Results of the assessment will determine the need for genetic counseling and BRCA1 and BRCA2 testing. Cervical Cancer Your health care provider may recommend that you be screened regularly for cancer of the pelvic organs (ovaries, uterus, and vagina). This screening involves a pelvic examination, including checking for microscopic changes to the surface of your cervix (Pap test). You may be encouraged to have this screening done every 3 years, beginning at age 26.  For women ages 26-65, health care providers may recommend pelvic exams and Pap testing every 3 years, or they may recommend the Pap and pelvic exam, combined with testing for human papilloma virus (HPV), every 5 years. Some types of HPV increase your risk of cervical cancer. Testing for HPV may also be done on women of any age with unclear Pap test results.  Other health care providers may not recommend any screening for nonpregnant women who are considered low risk for pelvic cancer and who do not have symptoms. Ask your health care provider if a screening pelvic exam is right for you.  If you have had past treatment for cervical cancer or a condition that could lead to cancer, you need Pap tests and screening for cancer for at least 20 years after your treatment. If Pap tests have been discontinued, your risk factors (such as having a new sexual partner) need to be reassessed to determine if screening should resume. Some women have medical problems that increase the chance of getting cervical cancer. In these cases, your health care provider may recommend more frequent screening and Pap tests. Colorectal Cancer  This type of cancer can be detected and often prevented.  Routine colorectal cancer screening usually begins at 26 years of age and continues through 26 years of age.  Your health care provider may recommend screening at an earlier age if you have risk factors for colon cancer.  Your health care provider may also  recommend using home test kits to check for hidden blood in the stool.  A small camera at the end of a tube can be used to examine your colon directly (sigmoidoscopy or colonoscopy). This is done to check for the earliest forms of colorectal cancer.  Routine screening usually begins at age 62.  Direct examination of the colon should be repeated every 5-10 years through 26 years of age. However, you may need to be screened more often if early forms of precancerous polyps or small growths are found. Skin Cancer  Check your skin from head to toe regularly.  Tell your health care provider about any new moles or changes in moles, especially if there is a change in a mole's shape or color.  Also tell your health care provider if you have a mole that is larger than the size of a pencil eraser.  Always use sunscreen. Apply sunscreen liberally and repeatedly throughout the day.  Protect yourself  by wearing long sleeves, pants, a wide-brimmed hat, and sunglasses whenever you are outside. HEART DISEASE, DIABETES, AND HIGH BLOOD PRESSURE   High blood pressure causes heart disease and increases the risk of stroke. High blood pressure is more likely to develop in:  People who have blood pressure in the high end of the normal range (130-139/85-89 mm Hg).  People who are overweight or obese.  People who are African American.  If you are 31-89 years of age, have your blood pressure checked every 3-5 years. If you are 68 years of age or older, have your blood pressure checked every year. You should have your blood pressure measured twice--once when you are at a hospital or clinic, and once when you are not at a hospital or clinic. Record the average of the two measurements. To check your blood pressure when you are not at a hospital or clinic, you can use:  An automated blood pressure machine at a pharmacy.  A home blood pressure monitor.  If you are between 55 years and 43 years old, ask your health  care provider if you should take aspirin to prevent strokes.  Have regular diabetes screenings. This involves taking a blood sample to check your fasting blood sugar level.  If you are at a normal weight and have a low risk for diabetes, have this test once every three years after 26 years of age.  If you are overweight and have a high risk for diabetes, consider being tested at a younger age or more often. PREVENTING INFECTION  Hepatitis B  If you have a higher risk for hepatitis B, you should be screened for this virus. You are considered at high risk for hepatitis B if:  You were born in a country where hepatitis B is common. Ask your health care provider which countries are considered high risk.  Your parents were born in a high-risk country, and you have not been immunized against hepatitis B (hepatitis B vaccine).  You have HIV or AIDS.  You use needles to inject street drugs.  You live with someone who has hepatitis B.  You have had sex with someone who has hepatitis B.  You get hemodialysis treatment.  You take certain medicines for conditions, including cancer, organ transplantation, and autoimmune conditions. Hepatitis C  Blood testing is recommended for:  Everyone born from [birth year] through 1966.  Anyone with known risk factors for hepatitis C. Sexually transmitted infections (STIs)  You should be screened for sexually transmitted infections (STIs) including gonorrhea and chlamydia if:  You are sexually active and are younger than 26 years of age.  You are older than 26 years of age and your health care provider tells you that you are at risk for this type of infection.  Your sexual activity has changed since you were last screened and you are at an increased risk for chlamydia or gonorrhea. Ask your health care provider if you are at risk.  If you do not have HIV, but are at risk, it may be recommended that you take a prescription medicine daily to prevent HIV  infection. This is called pre-exposure prophylaxis (PrEP). You are considered at risk if:  You are sexually active and do not regularly use condoms or know the HIV status of your partner(s).  You take drugs by injection.  You are sexually active with a partner who has HIV. Talk with your health care provider about whether you are at high risk of being infected with HIV.  If you choose to begin PrEP, you should first be tested for HIV. You should then be tested every 3 months for as long as you are taking PrEP.  PREGNANCY   If you are premenopausal and you may become pregnant, ask your health care provider about preconception counseling.  If you may become pregnant, take 400 to 800 micrograms (mcg) of folic acid every day.  If you want to prevent pregnancy, talk to your health care provider about birth control (contraception). OSTEOPOROSIS AND MENOPAUSE   Osteoporosis is a disease in which the bones lose minerals and strength with aging. This can result in serious bone fractures. Your risk for osteoporosis can be identified using a bone density scan.  If you are 53 years of age or older, or if you are at risk for osteoporosis and fractures, ask your health care provider if you should be screened.  Ask your health care provider whether you should take a calcium or vitamin D supplement to lower your risk for osteoporosis.  Menopause may have certain physical symptoms and risks.  Hormone replacement therapy may reduce some of these symptoms and risks. Talk to your health care provider about whether hormone replacement therapy is right for you.  HOME CARE INSTRUCTIONS   Schedule regular health, dental, and eye exams.  Stay current with your immunizations.   Do not use any tobacco products including cigarettes, chewing tobacco, or electronic cigarettes.  If you are pregnant, do not drink alcohol.  If you are breastfeeding, limit how much and how often you drink alcohol.  Limit  alcohol intake to no more than 1 drink per day for nonpregnant women. One drink equals 12 ounces of beer, 5 ounces of wine, or 1 ounces of hard liquor.  Do not use street drugs.  Do not share needles.  Ask your health care provider for help if you need support or information about quitting drugs.  Tell your health care provider if you often feel depressed.  Tell your health care provider if you have ever been abused or do not feel safe at home.   This information is not intended to replace advice given to you by your health care provider. Make sure you discuss any questions you have with your health care provider.   Document Released: 07/25/2010 Document Revised: 01/30/2014 Document Reviewed: 12/11/2012 Elsevier Interactive Patient Education [employer].

## 2015-02-11 NOTE — Assessment & Plan Note (Signed)
She is taking minocycline daily for her acne, talked to her about the possible harms of daily antibiotic usage. She will continue until next visit then we will plan to change to a face cream.

## 2015-02-11 NOTE — Assessment & Plan Note (Signed)
Refill of lorazepam and prozac given today. She is currently well controlled.

## 2015-02-11 NOTE — Assessment & Plan Note (Signed)
Rx for nadolol and she will use 1/2 pill daily. This has worked well for her in the past. No indication for imaging at this time given no change to her headache pattern.

## 2015-02-18 DIAGNOSIS — S43432D Superior glenoid labrum lesion of left shoulder, subsequent encounter: Secondary | ICD-10-CM | POA: Diagnosis not present

## 2015-07-26 DIAGNOSIS — H5213 Myopia, bilateral: Secondary | ICD-10-CM | POA: Diagnosis not present

## 2015-08-11 ENCOUNTER — Other Ambulatory Visit (INDEPENDENT_AMBULATORY_CARE_PROVIDER_SITE_OTHER): Payer: 59

## 2015-08-11 ENCOUNTER — Ambulatory Visit (INDEPENDENT_AMBULATORY_CARE_PROVIDER_SITE_OTHER): Payer: 59 | Admitting: Internal Medicine

## 2015-08-11 ENCOUNTER — Encounter: Payer: Self-pay | Admitting: Internal Medicine

## 2015-08-11 VITALS — BP 118/72 | HR 99 | Temp 97.9°F | Resp 14 | Ht 62.0 in | Wt 137.1 lb

## 2015-08-11 DIAGNOSIS — R5383 Other fatigue: Secondary | ICD-10-CM

## 2015-08-11 DIAGNOSIS — R51 Headache: Secondary | ICD-10-CM

## 2015-08-11 DIAGNOSIS — F410 Panic disorder [episodic paroxysmal anxiety] without agoraphobia: Secondary | ICD-10-CM

## 2015-08-11 DIAGNOSIS — R519 Headache, unspecified: Secondary | ICD-10-CM

## 2015-08-11 LAB — TSH: TSH: 2.44 u[IU]/mL (ref 0.35–4.50)

## 2015-08-11 LAB — T4, FREE: Free T4: 0.58 ng/dL — ABNORMAL LOW (ref 0.60–1.60)

## 2015-08-11 MED ORDER — NORGESTIM-ETH ESTRAD TRIPHASIC 0.18/0.215/0.25 MG-35 MCG PO TABS: 1.0000 | ORAL_TABLET | Freq: Every day | ORAL | Status: DC

## 2015-08-11 NOTE — Patient Instructions (Signed)
We will check the thyroid levels today and send the results on mychart.

## 2015-08-11 NOTE — Progress Notes (Signed)
   Subjective:    Patient ID: Michelle Waller, female    DOB: 01/30/1990, 25 y.o.   MRN: [id number]  HPI The patient is a 25 YO female coming in for follow up of several medical conditions including her migraines (better now due to stress management and is not taking nadolol, got severe fatigue with it), her acne (stable without minocycline) and her panic (well controlled still on prozac with rare lorazepam). No new complaints or concerns.   Review of Systems  Constitutional: Negative for fever, activity change, appetite change, fatigue and unexpected weight change.  HENT: Negative for congestion, ear pain, postnasal drip, rhinorrhea, sinus pressure and sore throat.   Eyes: Negative.   Respiratory: Negative for cough, chest tightness, shortness of breath and wheezing.   Cardiovascular: Negative for chest pain, palpitations and leg swelling.  Gastrointestinal: Negative for nausea, abdominal pain, diarrhea, constipation and abdominal distention.  Musculoskeletal: Negative.   Skin: Negative.   Neurological: Negative for dizziness, syncope, weakness, light-headedness and headaches.  Psychiatric/Behavioral: Negative.       Objective:   Physical Exam  Constitutional: She is oriented to person, place, and time. She appears well-developed and well-nourished.  HENT:  Head: Normocephalic and atraumatic.  Eyes: EOM are normal.  Neck: Normal range of motion.  Cardiovascular: Normal rate and regular rhythm.   No murmur heard. Pulmonary/Chest: Effort normal and breath sounds normal. No respiratory distress. She has no wheezes. She has no rales.  Abdominal: Soft. Bowel sounds are normal. She exhibits no distension. There is no tenderness. There is no rebound.  Musculoskeletal: She exhibits no edema.  Neurological: She is alert and oriented to person, place, and time. Coordination normal.  Skin: Skin is warm and dry.  Psychiatric: She has a normal mood and affect.   Filed Vitals:   08/11/15 0850    BP: 118/72  Pulse: 99  Temp: 97.9 F (36.6 C)  TempSrc: Oral  Resp: 14  Height: 5\' 2"  (1.575 m)  Weight: 137 lb 1.9 oz (62.197 kg)  SpO2: 99%      Assessment & Plan:

## 2015-08-11 NOTE — Progress Notes (Signed)
Pre visit review using our clinic review tool, if applicable. No additional management support is needed unless otherwise documented below in the visit note. 

## 2015-08-11 NOTE — Assessment & Plan Note (Signed)
Doing well now, had some fatigue with nadolol will check thyroid levels today.

## 2015-08-13 NOTE — Assessment & Plan Note (Signed)
Stable on prozac with rare lorazepam.

## 2015-09-02 ENCOUNTER — Other Ambulatory Visit: Payer: Self-pay | Admitting: Obstetrics & Gynecology

## 2015-09-02 DIAGNOSIS — Z01419 Encounter for gynecological examination (general) (routine) without abnormal findings: Secondary | ICD-10-CM | POA: Diagnosis not present

## 2015-09-02 DIAGNOSIS — Z6824 Body mass index (BMI) 24.0-24.9, adult: Secondary | ICD-10-CM | POA: Diagnosis not present

## 2015-09-02 DIAGNOSIS — Z113 Encounter for screening for infections with a predominantly sexual mode of transmission: Secondary | ICD-10-CM | POA: Diagnosis not present

## 2015-09-02 DIAGNOSIS — Z124 Encounter for screening for malignant neoplasm of cervix: Secondary | ICD-10-CM | POA: Diagnosis not present

## 2015-09-03 LAB — CYTOLOGY - PAP

## 2015-09-14 ENCOUNTER — Other Ambulatory Visit: Payer: Self-pay | Admitting: Obstetrics & Gynecology

## 2015-09-14 DIAGNOSIS — N879 Dysplasia of cervix uteri, unspecified: Secondary | ICD-10-CM | POA: Diagnosis not present

## 2015-09-14 DIAGNOSIS — Z6824 Body mass index (BMI) 24.0-24.9, adult: Secondary | ICD-10-CM | POA: Diagnosis not present

## 2015-09-14 DIAGNOSIS — N870 Mild cervical dysplasia: Secondary | ICD-10-CM | POA: Diagnosis not present

## 2015-09-14 DIAGNOSIS — R1031 Right lower quadrant pain: Secondary | ICD-10-CM | POA: Diagnosis not present

## 2015-09-14 DIAGNOSIS — N72 Inflammatory disease of cervix uteri: Secondary | ICD-10-CM | POA: Diagnosis not present

## 2015-09-26 ENCOUNTER — Encounter: Payer: Self-pay | Admitting: Internal Medicine

## 2015-09-26 DIAGNOSIS — E039 Hypothyroidism, unspecified: Secondary | ICD-10-CM

## 2015-09-28 ENCOUNTER — Other Ambulatory Visit (INDEPENDENT_AMBULATORY_CARE_PROVIDER_SITE_OTHER): Payer: 59

## 2015-09-28 DIAGNOSIS — E039 Hypothyroidism, unspecified: Secondary | ICD-10-CM

## 2015-09-28 LAB — COMPREHENSIVE METABOLIC PANEL
ALT: 46 U/L — ABNORMAL HIGH (ref 0–35)
AST: 38 U/L — ABNORMAL HIGH (ref 0–37)
Albumin: 4.4 g/dL (ref 3.5–5.2)
Alkaline Phosphatase: 66 U/L (ref 39–117)
BUN: 9 mg/dL (ref 6–23)
CO2: 28 mEq/L (ref 19–32)
Calcium: 9.1 mg/dL (ref 8.4–10.5)
Chloride: 102 mEq/L (ref 96–112)
Creatinine, Ser: 0.69 mg/dL (ref 0.40–1.20)
GFR: 108.97 mL/min (ref >60.00)
Glucose, Bld: 95 mg/dL (ref 70–99)
Potassium: 4.6 mEq/L (ref 3.5–5.1)
Sodium: 138 mEq/L (ref 135–145)
Total Bilirubin: 0.4 mg/dL (ref 0.2–1.2)
Total Protein: 7.3 g/dL (ref 6.0–8.3)

## 2015-09-28 LAB — CBC
HCT: 36.9 % (ref 36.0–46.0)
Hemoglobin: 12.6 g/dL (ref 12.0–15.0)
MCHC: 34.3 g/dL (ref 30.0–36.0)
MCV: 86.2 fl (ref 78.0–100.0)
Platelets: 189 10*3/uL (ref 150.0–400.0)
RBC: 4.28 Mil/uL (ref 3.87–5.11)
RDW: 13.6 % (ref 11.5–15.5)
WBC: 4.3 10*3/uL (ref 4.0–10.5)

## 2015-09-28 LAB — TSH: TSH: 3.89 u[IU]/mL (ref 0.35–4.50)

## 2015-09-28 LAB — T4, FREE: Free T4: 0.67 ng/dL (ref 0.60–1.60)

## 2015-09-30 ENCOUNTER — Encounter: Payer: Self-pay | Admitting: Internal Medicine

## 2015-09-30 DIAGNOSIS — E039 Hypothyroidism, unspecified: Secondary | ICD-10-CM

## 2015-09-30 MED ORDER — LEVOTHYROXINE SODIUM 50 MCG PO TABS: 50.0000 ug | ORAL_TABLET | Freq: Every day | ORAL | 3 refills | Status: DC

## 2015-11-24 ENCOUNTER — Encounter: Payer: Self-pay | Admitting: Internal Medicine

## 2015-11-25 ENCOUNTER — Other Ambulatory Visit (INDEPENDENT_AMBULATORY_CARE_PROVIDER_SITE_OTHER): Payer: 59

## 2015-11-25 DIAGNOSIS — E039 Hypothyroidism, unspecified: Secondary | ICD-10-CM | POA: Diagnosis not present

## 2015-11-25 LAB — T4, FREE: Free T4: 0.72 ng/dL (ref 0.60–1.60)

## 2015-11-25 LAB — TSH: TSH: 1.28 u[IU]/mL (ref 0.35–4.50)

## 2015-11-26 DIAGNOSIS — D692 Other nonthrombocytopenic purpura: Secondary | ICD-10-CM | POA: Diagnosis not present

## 2015-11-26 DIAGNOSIS — D2272 Melanocytic nevi of left lower limb, including hip: Secondary | ICD-10-CM | POA: Diagnosis not present

## 2015-11-26 DIAGNOSIS — D2261 Melanocytic nevi of right upper limb, including shoulder: Secondary | ICD-10-CM | POA: Diagnosis not present

## 2015-11-26 DIAGNOSIS — L700 Acne vulgaris: Secondary | ICD-10-CM | POA: Diagnosis not present

## 2015-11-26 DIAGNOSIS — D2262 Melanocytic nevi of left upper limb, including shoulder: Secondary | ICD-10-CM | POA: Diagnosis not present

## 2015-11-26 DIAGNOSIS — D225 Melanocytic nevi of trunk: Secondary | ICD-10-CM | POA: Diagnosis not present

## 2015-12-07 ENCOUNTER — Emergency Department
Admission: EM | Admit: 2015-12-07 | Discharge: 2015-12-07 | Disposition: A | Discharge status: Home / Self Care | Payer: 59 | Source: Home / Self Care | Attending: Family Medicine | Admitting: Family Medicine

## 2015-12-07 DIAGNOSIS — N3001 Acute cystitis with hematuria: Secondary | ICD-10-CM | POA: Diagnosis not present

## 2015-12-07 DIAGNOSIS — N760 Acute vaginitis: Secondary | ICD-10-CM | POA: Diagnosis not present

## 2015-12-07 LAB — POCT URINALYSIS DIP (MANUAL ENTRY)
Bilirubin, UA: NEGATIVE
Glucose, UA: NEGATIVE
Ketones, POC UA: NEGATIVE
Nitrite, UA: POSITIVE — AB
Protein Ur, POC: NEGATIVE
Spec Grav, UA: 1.025
Urobilinogen, UA: 0.2
pH, UA: 6.5

## 2015-12-07 MED ORDER — CEPHALEXIN 500 MG PO CAPS: 500.0000 mg | ORAL_CAPSULE | Freq: Two times a day (BID) | ORAL | 0 refills | Status: DC

## 2015-12-07 NOTE — ED Provider Notes (Signed)
CSN: [id number]     Arrival date & time 12/07/15  1851 History   First MD Initiated Contact with Patient 12/07/15 1902     Chief Complaint  Patient presents with  . Dysuria   (Consider location/radiation/quality/duration/timing/severity/associated sxs/prior Treatment) HPI Michelle Waller is a 25 y.o. female presenting to UC with c/o sudden onset dysuria and urinary frequency that started this morning. Hx of UTIs. Last one was about 1 year ago. She has not tried anything for her symptoms yet. Denies fever, chills, n/v/d. Denies gross hematuria but notes she always has trace blood in urine. Denies abdominal pain or back pain.  She has been on Cipro in the past for her UTIs.   Past Medical History:  Diagnosis Date  . Eating disorder   . Frequent headaches   . GERD (gastroesophageal reflux disease)   . Hypertension   . Kidney stones   . Ovarian cyst   . UTI (urinary tract infection)    Past Surgical History:  Procedure Laterality Date  . ADENOIDECTOMY    . COLONOSCOPY    . CYSTOSCOPY    . KNEE SURGERY     Family History  Problem Relation Age of Onset  . Depression Mother   . Atrial fibrillation Mother   . Hypertension Father   . Arthritis Maternal Grandmother   . Heart disease Maternal Grandmother   . Cancer Paternal Grandmother     breast  . Hypertension Paternal Grandmother   . Heart disease Paternal Grandfather   . Hypertension Paternal Grandfather   . Diabetes Paternal Grandfather    Social History  Substance Use Topics  . Smoking status: Former Research scientist (life sciences)  . Smokeless tobacco: Not on file  . Alcohol use 0.0 oz/week     Comment: occasionally    OB History    Gravida Para Term Preterm AB Living   0 0 0 0 0 0   SAB TAB Ectopic Multiple Live Births   0 0 0 0       Review of Systems  Constitutional: Negative for chills and fever.  Gastrointestinal: Negative for abdominal pain, diarrhea, nausea and vomiting.  Genitourinary: Positive for dysuria, frequency and urgency.  Negative for flank pain, hematuria and pelvic pain.  Musculoskeletal: Negative for back pain and myalgias.    Allergies  Soy allergy and Medrol [methylprednisolone]  Home Medications   Prior to Admission medications   Medication Sig Start Date End Date Taking? Authorizing Provider  EPIPEN 2-PAK 0.3 MG/0.3ML SOAJ injection USE SUBCUTANEOUSLY AS DIRECTED 05/28/14  Yes Historical Provider, MD  FLUoxetine (PROZAC) 20 MG capsule Take 1 capsule (20 mg total) by mouth every evening. 02/11/15  Yes Hoyt Koch, MD  levothyroxine (SYNTHROID, LEVOTHROID) 50 MCG tablet Take 1 tablet (50 mcg total) by mouth daily. 09/30/15  Yes Hoyt Koch, MD  LORazepam (ATIVAN) 0.5 MG tablet Take 0.5-1 tablets (0.25-0.5 mg total) by mouth every 8 (eight) hours as needed for anxiety. 02/11/15  Yes Hoyt Koch, MD  Norgestimate-Ethinyl Estradiol Triphasic (TRI-LINYAH) 0.18/0.215/0.25 MG-35 MCG tablet Take 1 tablet by mouth daily. 08/11/15  Yes Hoyt Koch, MD  omeprazole (PRILOSEC) 20 MG capsule Take 1 capsule (20 mg total) by mouth daily. 02/11/15  Yes Hoyt Koch, MD  cephALEXin (KEFLEX) 500 MG capsule Take 1 capsule (500 mg total) by mouth 2 (two) times daily. 12/07/15   Noland Fordyce, PA-C   Meds Ordered and Administered this Visit  Medications - No data to display  BP (!) 143/108 (BP Location:  Left Arm)   Pulse 81   Temp 97.7 F (36.5 C) (Oral)   Ht 5\' 2"  (1.575 m)   Wt 139 lb (63 kg)   SpO2 100%   BMI 25.42 kg/m  No data found.   Physical Exam  Constitutional: She is oriented to person, place, and time. She appears well-developed and well-nourished. No distress.  HENT:  Head: Normocephalic and atraumatic.  Mouth/Throat: Oropharynx is clear and moist.  Eyes: EOM are normal.  Neck: Normal range of motion.  Cardiovascular: Normal rate.   Pulmonary/Chest: Effort normal. No respiratory distress.  Abdominal: Soft. She exhibits no distension and no mass. There is no  tenderness. There is no rebound, no guarding and no CVA tenderness.  Musculoskeletal: Normal range of motion.  Neurological: She is alert and oriented to person, place, and time.  Skin: Skin is warm and dry. She is not diaphoretic.  Psychiatric: She has a normal mood and affect. Her behavior is normal.  Nursing note and vitals reviewed.   Urgent Care Course   Clinical Course     Procedures (including critical care time)  Labs Review Labs Reviewed  POCT URINALYSIS DIP (MANUAL ENTRY) - Abnormal; Notable for the following:       Result Value   Clarity, UA cloudy (*)    Blood, UA small (*)    Nitrite, UA Positive (*)    Leukocytes, UA moderate (2+) (*)    All other components within normal limits  URINE CULTURE    Imaging Review No results found.   MDM   1. Acute cystitis with hematuria    Pt c/o sudden onset urinary symptoms c/w prior UTIs.  UA: c/w UTI Will send culture. Rx: Keflex Advised pt she will be notified in 2-3 days of culture results, different antibiotic will be called in if indicated by results.     Noland Fordyce, PA-C 12/07/15 [phone]

## 2015-12-07 NOTE — ED Triage Notes (Signed)
Patient c/o urinary frequency and burning

## 2015-12-10 ENCOUNTER — Telehealth: Payer: Self-pay | Admitting: *Deleted

## 2015-12-10 LAB — URINE CULTURE

## 2015-12-10 NOTE — Telephone Encounter (Signed)
Callback: No answer, left message on patients mobile VM UCX + and sensitive to prescribed antibiotic. Encouraged to complete course.

## 2015-12-13 ENCOUNTER — Telehealth (INDEPENDENT_AMBULATORY_CARE_PROVIDER_SITE_OTHER): Payer: 59 | Admitting: *Deleted

## 2015-12-13 DIAGNOSIS — Z112 Encounter for screening for other bacterial diseases: Secondary | ICD-10-CM

## 2015-12-13 MED ORDER — FLUCONAZOLE 150 MG PO TABS: 150.0000 mg | ORAL_TABLET | Freq: Every day | ORAL | 0 refills | Status: DC

## 2015-12-13 NOTE — Telephone Encounter (Signed)
Pt called reports white vaginal d/c, redness and itching after taking keflex. Called in diflucan to pharmacy.

## 2015-12-14 ENCOUNTER — Emergency Department
Admission: EM | Admit: 2015-12-14 | Discharge: 2015-12-14 | Disposition: A | Discharge status: Home / Self Care | Payer: 59 | Source: Home / Self Care | Attending: Family Medicine | Admitting: Family Medicine

## 2015-12-14 ENCOUNTER — Encounter: Payer: Self-pay | Admitting: *Deleted

## 2015-12-14 DIAGNOSIS — N949 Unspecified condition associated with female genital organs and menstrual cycle: Secondary | ICD-10-CM

## 2015-12-14 DIAGNOSIS — N760 Acute vaginitis: Secondary | ICD-10-CM

## 2015-12-14 DIAGNOSIS — B9689 Other specified bacterial agents as the cause of diseases classified elsewhere: Secondary | ICD-10-CM

## 2015-12-14 LAB — POCT URINALYSIS DIP (MANUAL ENTRY)
Bilirubin, UA: NEGATIVE
Blood, UA: NEGATIVE
Glucose, UA: NEGATIVE
Ketones, POC UA: NEGATIVE
Nitrite, UA: NEGATIVE
Spec Grav, UA: 1.02 (ref 1.005–1.03)
Urobilinogen, UA: 0.2 (ref 0–1)
pH, UA: 6.5 (ref 5–8)

## 2015-12-14 LAB — POCT WET + KOH PREP: Trich by wet prep: ABSENT

## 2015-12-14 MED ORDER — FLUCONAZOLE 150 MG PO TABS: 150.0000 mg | ORAL_TABLET | Freq: Once | ORAL | 0 refills | Status: AC

## 2015-12-14 MED ORDER — METRONIDAZOLE 500 MG PO TABS: ORAL_TABLET | ORAL | 0 refills | Status: DC

## 2015-12-14 NOTE — ED Provider Notes (Signed)
[name]    CSN: [id number] Arrival date & time: 12/14/15  1845     History   Chief Complaint Chief Complaint  Patient presents with  . Vaginal Itching  . Vaginal Burning    HPI Michelle Waller is a 25 y.o. female.   Patient was treated for UTI one week ago with Keflex.  She reports that her dysuria/frequency resolved after several days.  Four days ago she began to experience mild vaginal discharge/itching that has become worse.  No abdominal or pelvic pain.  Two days ago she tried Monistat without improvement.  Last night she took a Diflucan tab.  She feels well otherwise.  She finished her period four days ago.  She notes that she has a history of recurring BV.  She also requests testing for GC/chlamydia.   The history is provided by the patient.  Vaginal Itching  This is a new problem. The current episode started more than 2 days ago. The problem occurs constantly. The problem has been rapidly worsening. Pertinent negatives include no abdominal pain. Nothing aggravates the symptoms. Nothing relieves the symptoms. Treatments tried: Monistat. The treatment provided no relief.    Past Medical History:  Diagnosis Date  . Eating disorder   . Frequent headaches   . GERD (gastroesophageal reflux disease)   . Hypertension   . Kidney stones   . Ovarian cyst   . UTI (urinary tract infection)     Patient Active Problem List   Diagnosis Date Noted  . Recurrent headache 02/11/2015  . Panic attack 02/11/2015  . GERD (gastroesophageal reflux disease) 02/11/2015  . Acne 02/11/2015  . Kidney stones 02/11/2015  . Pelvic pain in female 08/20/2014    Past Surgical History:  Procedure Laterality Date  . ADENOIDECTOMY    . COLONOSCOPY    . CYSTOSCOPY    . KNEE SURGERY      OB History    Gravida Para Term Preterm AB Living   0 0 0 0 0 0   SAB TAB Ectopic Multiple Live Births   0 0 0 0         Home Medications    Prior to Admission medications   Medication  Sig Start Date End Date Taking? Authorizing Provider  EPIPEN 2-PAK 0.3 MG/0.3ML SOAJ injection USE SUBCUTANEOUSLY AS DIRECTED 05/28/14   Historical Provider, MD  fluconazole (DIFLUCAN) 150 MG tablet Take 1 tablet (150 mg total) by mouth daily. 12/13/15   Kandra Nicolas, MD  FLUoxetine (PROZAC) 20 MG capsule Take 1 capsule (20 mg total) by mouth every evening. 02/11/15   Hoyt Koch, MD  levothyroxine (SYNTHROID, LEVOTHROID) 50 MCG tablet Take 1 tablet (50 mcg total) by mouth daily. 09/30/15   Hoyt Koch, MD  LORazepam (ATIVAN) 0.5 MG tablet Take 0.5-1 tablets (0.25-0.5 mg total) by mouth every 8 (eight) hours as needed for anxiety. 02/11/15   Hoyt Koch, MD  metroNIDAZOLE (FLAGYL) 500 MG tablet Take one tab by mouth every 12 hours for 7 days. 12/14/15   Kandra Nicolas, MD  Norgestimate-Ethinyl Estradiol Triphasic (TRI-LINYAH) 0.18/0.215/0.25 MG-35 MCG tablet Take 1 tablet by mouth daily. 08/11/15   Hoyt Koch, MD  omeprazole (PRILOSEC) 20 MG capsule Take 1 capsule (20 mg total) by mouth daily. 02/11/15   Hoyt Koch, MD    Family History Family History  Problem Relation Age of Onset  . Depression Mother   . Atrial fibrillation Mother   . Hypertension Father   .  Arthritis Maternal Grandmother   . Heart disease Maternal Grandmother   . Cancer Paternal Grandmother     breast  . Hypertension Paternal Grandmother   . Heart disease Paternal Grandfather   . Hypertension Paternal Grandfather   . Diabetes Paternal Grandfather     Social History Social History  Substance Use Topics  . Smoking status: Former Research scientist (life sciences)  . Smokeless tobacco: Never Used  . Alcohol use 0.0 oz/week     Comment: occasionally      Allergies   Soy allergy and Medrol [methylprednisolone]   Review of Systems Review of Systems  Gastrointestinal: Negative for abdominal pain, nausea and vomiting.  Genitourinary: Positive for vaginal discharge. Negative for flank pain,  frequency, genital sores, hematuria, pelvic pain, urgency, vaginal bleeding and vaginal pain.  Skin: Negative for rash.  All other systems reviewed and are negative.    Physical Exam Triage Vital Signs ED Triage Vitals  Enc Vitals Group     BP 12/14/15 1904 (!) 144/101     Pulse Rate 12/14/15 1904 79     Resp --      Temp 12/14/15 1904 98.4 F (36.9 C)     Temp Source 12/14/15 1904 Oral     SpO2 12/14/15 1904 100 %     Weight --      Height --      Head Circumference --      Peak Flow --      Pain Score 12/14/15 1906 0     Pain Loc --      Pain Edu? --      Excl. in [place]? --    No data found.   Updated Vital Signs BP (!) 144/101 (BP Location: Left Arm)   Pulse 79   Temp 98.4 F (36.9 C) (Oral)   LMP 12/07/2015   SpO2 100%   Visual Acuity Right Eye Distance:   Left Eye Distance:   Bilateral Distance:    Right Eye Near:   Left Eye Near:    Bilateral Near:     Physical Exam Nursing notes and Vital Signs reviewed. Appearance:  Patient appears stated age, and in no acute distress Eyes:  Pupils are equal, round, and reactive to light and accomodation.  Extraocular movement is intact.  Conjunctivae are not inflamed  Pharynx/mouth:  Normal Neck:  Supple.  No adenopathy Lungs:  Clear to auscultation.  Breath sounds are equal.  Moving air well. Heart:  Regular rate and rhythm without murmurs, rubs, or gallops.  Abdomen:  Nontender without masses or hepatosplenomegaly.  Bowel sounds are present.  No CVA or flank tenderness.  Extremities:  No edema.  Skin:  No rash present.   Pelvic exam deferred.  Patient self-obtained vaginal specimens.  UC Treatments / Results  Labs (all labs ordered are listed, but only abnormal results are displayed) Labs Reviewed  POCT URINALYSIS DIP (MANUAL ENTRY) - Abnormal; Notable for the following:       Result Value   Protein Ur, POC trace (*)    Leukocytes, UA Trace (*)    All other components within normal limits  GC/CHLAMYDIA PROBE  AMP  URINE CULTURE  POCT WET + KOH PREP;  Epithelial cells present; clue cells present; few budding yeast present; no trich; rare WBC; few bacteria    EKG  EKG Interpretation None       Radiology No results found.  Procedures Procedures (including critical care time)  Medications Ordered in UC Medications - No data to display  Initial Impression / Assessment and Plan / UC Course  I have reviewed the triage vital signs and the nursing notes.  Pertinent labs & imaging results that were available during my care of the patient were reviewed by me and considered in my medical decision making (see chart for details).  Clinical Course   UTI appears to have resolved by history; will repeat urine culture. Begin Flagyl.  Patient reports that she has recurring BV.  Recommend trial of probiotic Fem-Dophilus (by Sallee Lange). Her candida vaginitis should respond to the Diflucan she took last night, but will write Rx for additional Diflucan. GC/chlamydia probe pending.    Final Clinical Impressions(s) / UC Diagnoses   Final diagnoses:  Vaginal burning  Bacterial vaginosis    New Prescriptions New Prescriptions   METRONIDAZOLE (FLAGYL) 500 MG TABLET    Take one tab by mouth every 12 hours for 7 days.     Kandra Nicolas, MD 12/14/15 [phone]

## 2015-12-14 NOTE — ED Triage Notes (Signed)
Pt c/o persistent vaginal burning and itching and white discharge. Treated for UTI with Keflex, has 2 doses left. Started Diflucan yesterday for yeast infection from keflex. Concern for possible STD.

## 2015-12-14 NOTE — Discharge Instructions (Signed)
Stop Keflex. Try probiotic Fem-Dophilus (by [place]) for prevention of BV.

## 2015-12-15 LAB — GC/CHLAMYDIA PROBE AMP
CT Probe RNA: NOT DETECTED
GC Probe RNA: NOT DETECTED

## 2015-12-16 LAB — URINE CULTURE: Organism ID, Bacteria: NO GROWTH

## 2015-12-17 ENCOUNTER — Telehealth: Payer: Self-pay | Admitting: Emergency Medicine

## 2015-12-17 NOTE — Telephone Encounter (Signed)
Pt informed of results and states that she is doing better.  Strong

## 2016-02-02 ENCOUNTER — Ambulatory Visit: Payer: Self-pay | Admitting: Internal Medicine

## 2016-02-03 ENCOUNTER — Encounter: Payer: Self-pay | Admitting: Internal Medicine

## 2016-02-03 ENCOUNTER — Ambulatory Visit (INDEPENDENT_AMBULATORY_CARE_PROVIDER_SITE_OTHER): Payer: 59 | Admitting: Internal Medicine

## 2016-02-03 DIAGNOSIS — E039 Hypothyroidism, unspecified: Secondary | ICD-10-CM | POA: Diagnosis not present

## 2016-02-03 DIAGNOSIS — F410 Panic disorder [episodic paroxysmal anxiety] without agoraphobia: Secondary | ICD-10-CM | POA: Diagnosis not present

## 2016-02-03 DIAGNOSIS — K219 Gastro-esophageal reflux disease without esophagitis: Secondary | ICD-10-CM

## 2016-02-03 NOTE — Progress Notes (Signed)
Pre visit review using our clinic review tool, if applicable. No additional management support is needed unless otherwise documented below in the visit note. 

## 2016-02-03 NOTE — Progress Notes (Signed)
   Subjective:    Patient ID: Michelle Waller, female    DOB: Oct 25, 1990, 26 y.o.   MRN: [id number]  HPI The patient is a 26 YO female coming in for paperwork to be filled out and follow up thyroid. Since starting medication feels more energy. No side effects. Denies cold or heat intolerance. Denies SOB or chest pains. Denies abdominal pain or diarrhea or constipation. Wants to continue with thyroid medication as she is feeling improved. Labs about 1-2 months ago in normal range with current dosing and reviewed with her.   Review of Systems  Constitutional: Negative.   Respiratory: Negative for cough, chest tightness and shortness of breath.   Cardiovascular: Negative for chest pain, palpitations and leg swelling.  Gastrointestinal: Negative for abdominal distention, abdominal pain, constipation, diarrhea, nausea and vomiting.  Musculoskeletal: Negative.   Skin: Negative.       Objective:   Physical Exam  Constitutional: She is oriented to person, place, and time. She appears well-developed and well-nourished.  HENT:  Head: Normocephalic and atraumatic.  Eyes: EOM are normal.  Neck: Normal range of motion.  Cardiovascular: Normal rate and regular rhythm.   Pulmonary/Chest: Effort normal and breath sounds normal. No respiratory distress. She has no wheezes. She has no rales.  Abdominal: Soft. She exhibits no distension. There is no tenderness. There is no rebound.  Musculoskeletal: She exhibits no edema.  Neurological: She is alert and oriented to person, place, and time. Coordination normal.  Skin: Skin is warm and dry.   Vitals:   02/03/16 1508  BP: 138/88  Pulse: 76  Resp: 12  Temp: 98.3 F (36.8 C)  TempSrc: Oral  SpO2: 99%  Weight: 137 lb (62.1 kg)  Height: 5\' 2"  (1.575 m)      Assessment & Plan:

## 2016-02-03 NOTE — Patient Instructions (Signed)
We will fill out the forms and send them back in.

## 2016-02-04 ENCOUNTER — Encounter: Payer: Self-pay | Admitting: Internal Medicine

## 2016-02-04 NOTE — Assessment & Plan Note (Signed)
Recent TSH and free T4 in normal range, continue synthroid 50 mcg daily.

## 2016-02-04 NOTE — Assessment & Plan Note (Signed)
Stable on omeprazole 20 mg daily without symptoms.

## 2016-02-04 NOTE — Assessment & Plan Note (Signed)
Taking prozac 20 mg daily and well controlled. No changes today.

## 2016-02-11 DIAGNOSIS — R300 Dysuria: Secondary | ICD-10-CM | POA: Diagnosis not present

## 2016-03-01 DIAGNOSIS — R300 Dysuria: Secondary | ICD-10-CM | POA: Diagnosis not present

## 2016-03-13 DIAGNOSIS — R109 Unspecified abdominal pain: Secondary | ICD-10-CM | POA: Diagnosis not present

## 2016-03-13 DIAGNOSIS — M6281 Muscle weakness (generalized): Secondary | ICD-10-CM | POA: Diagnosis not present

## 2016-03-13 DIAGNOSIS — M62838 Other muscle spasm: Secondary | ICD-10-CM | POA: Diagnosis not present

## 2016-03-21 DIAGNOSIS — M62838 Other muscle spasm: Secondary | ICD-10-CM | POA: Diagnosis not present

## 2016-03-21 DIAGNOSIS — M6289 Other specified disorders of muscle: Secondary | ICD-10-CM | POA: Diagnosis not present

## 2016-03-21 DIAGNOSIS — R1030 Lower abdominal pain, unspecified: Secondary | ICD-10-CM | POA: Diagnosis not present

## 2016-03-21 DIAGNOSIS — M6281 Muscle weakness (generalized): Secondary | ICD-10-CM | POA: Diagnosis not present

## 2016-03-29 DIAGNOSIS — M62838 Other muscle spasm: Secondary | ICD-10-CM | POA: Diagnosis not present

## 2016-03-29 DIAGNOSIS — M6281 Muscle weakness (generalized): Secondary | ICD-10-CM | POA: Diagnosis not present

## 2016-03-29 DIAGNOSIS — M6289 Other specified disorders of muscle: Secondary | ICD-10-CM | POA: Diagnosis not present

## 2016-03-29 DIAGNOSIS — R1030 Lower abdominal pain, unspecified: Secondary | ICD-10-CM | POA: Diagnosis not present

## 2016-04-05 ENCOUNTER — Other Ambulatory Visit: Payer: Self-pay | Admitting: Internal Medicine

## 2016-04-05 DIAGNOSIS — M6281 Muscle weakness (generalized): Secondary | ICD-10-CM | POA: Diagnosis not present

## 2016-04-05 DIAGNOSIS — R109 Unspecified abdominal pain: Secondary | ICD-10-CM | POA: Diagnosis not present

## 2016-04-05 DIAGNOSIS — R1030 Lower abdominal pain, unspecified: Secondary | ICD-10-CM | POA: Diagnosis not present

## 2016-04-05 DIAGNOSIS — R300 Dysuria: Secondary | ICD-10-CM | POA: Diagnosis not present

## 2016-04-05 DIAGNOSIS — M6289 Other specified disorders of muscle: Secondary | ICD-10-CM | POA: Diagnosis not present

## 2016-04-05 DIAGNOSIS — M62838 Other muscle spasm: Secondary | ICD-10-CM | POA: Diagnosis not present

## 2016-04-12 DIAGNOSIS — M62838 Other muscle spasm: Secondary | ICD-10-CM | POA: Diagnosis not present

## 2016-04-12 DIAGNOSIS — M6281 Muscle weakness (generalized): Secondary | ICD-10-CM | POA: Diagnosis not present

## 2016-04-12 DIAGNOSIS — M6289 Other specified disorders of muscle: Secondary | ICD-10-CM | POA: Diagnosis not present

## 2016-04-12 DIAGNOSIS — R109 Unspecified abdominal pain: Secondary | ICD-10-CM | POA: Diagnosis not present

## 2016-04-12 DIAGNOSIS — R1030 Lower abdominal pain, unspecified: Secondary | ICD-10-CM | POA: Diagnosis not present

## 2016-05-03 DIAGNOSIS — H5213 Myopia, bilateral: Secondary | ICD-10-CM | POA: Diagnosis not present

## 2016-05-03 DIAGNOSIS — M62838 Other muscle spasm: Secondary | ICD-10-CM | POA: Diagnosis not present

## 2016-05-03 DIAGNOSIS — R1030 Lower abdominal pain, unspecified: Secondary | ICD-10-CM | POA: Diagnosis not present

## 2016-05-03 DIAGNOSIS — M6281 Muscle weakness (generalized): Secondary | ICD-10-CM | POA: Diagnosis not present

## 2016-05-03 DIAGNOSIS — R102 Pelvic and perineal pain: Secondary | ICD-10-CM | POA: Diagnosis not present

## 2016-05-29 ENCOUNTER — Telehealth: Payer: Self-pay | Admitting: *Deleted

## 2016-05-31 ENCOUNTER — Ambulatory Visit (INDEPENDENT_AMBULATORY_CARE_PROVIDER_SITE_OTHER): Payer: 59 | Admitting: Internal Medicine

## 2016-05-31 ENCOUNTER — Encounter: Payer: Self-pay | Admitting: Internal Medicine

## 2016-05-31 ENCOUNTER — Other Ambulatory Visit (INDEPENDENT_AMBULATORY_CARE_PROVIDER_SITE_OTHER): Payer: 59

## 2016-05-31 VITALS — BP 128/70 | HR 84 | Temp 98.4°F | Resp 12 | Ht 62.0 in | Wt 144.0 lb

## 2016-05-31 DIAGNOSIS — F410 Panic disorder [episodic paroxysmal anxiety] without agoraphobia: Secondary | ICD-10-CM

## 2016-05-31 DIAGNOSIS — E039 Hypothyroidism, unspecified: Secondary | ICD-10-CM

## 2016-05-31 DIAGNOSIS — R55 Syncope and collapse: Secondary | ICD-10-CM

## 2016-05-31 LAB — TSH: TSH: 2.72 u[IU]/mL (ref 0.35–4.50)

## 2016-05-31 LAB — COMPREHENSIVE METABOLIC PANEL
ALT: 18 U/L (ref 0–35)
AST: 21 U/L (ref 0–37)
Albumin: 3.8 g/dL (ref 3.5–5.2)
Alkaline Phosphatase: 54 U/L (ref 39–117)
BUN: 13 mg/dL (ref 6–23)
CO2: 27 mEq/L (ref 19–32)
Calcium: 8.9 mg/dL (ref 8.4–10.5)
Chloride: 105 mEq/L (ref 96–112)
Creatinine, Ser: 0.73 mg/dL (ref 0.40–1.20)
GFR: 101.59 mL/min (ref >60.00)
Glucose, Bld: 90 mg/dL (ref 70–99)
Potassium: 4.5 mEq/L (ref 3.5–5.1)
Sodium: 138 mEq/L (ref 135–145)
Total Bilirubin: 0.4 mg/dL (ref 0.2–1.2)
Total Protein: 7 g/dL (ref 6.0–8.3)

## 2016-05-31 LAB — VITAMIN D 25 HYDROXY (VIT D DEFICIENCY, FRACTURES): VITD: 33.75 ng/mL (ref 30.00–100.00)

## 2016-05-31 LAB — T4, FREE: Free T4: 0.79 ng/dL (ref 0.60–1.60)

## 2016-05-31 LAB — FERRITIN: Ferritin: 7.7 ng/mL — ABNORMAL LOW (ref 10.0–291.0)

## 2016-05-31 LAB — CBC
HCT: 39 % (ref 36.0–46.0)
Hemoglobin: 13.1 g/dL (ref 12.0–15.0)
MCHC: 33.6 g/dL (ref 30.0–36.0)
MCV: 86.2 fl (ref 78.0–100.0)
Platelets: 219 10*3/uL (ref 150.0–400.0)
RBC: 4.53 Mil/uL (ref 3.87–5.11)
RDW: 12.8 % (ref 11.5–15.5)
WBC: 3.6 10*3/uL — ABNORMAL LOW (ref 4.0–10.5)

## 2016-05-31 LAB — VITAMIN B12: Vitamin B-12: 193 pg/mL — ABNORMAL LOW (ref 211–911)

## 2016-05-31 NOTE — Progress Notes (Signed)
   Subjective:    Patient ID: Michelle Waller, female    DOB: [birth year], 26 y.o.   MRN: [id number]  HPI The patient is a 26 YO female coming in for episode of lightheadedness at the gym. She was working out and felt like she might pass out. She was able to sit and drink something and it passed. She denies syncope. She felt confused for a time after the incident and had to have someone drive her home due to feeling unsafe. She has also been feeling off lately and some pre-syncope over the last couple of weeks. She denies change in diet or exercise. She normally exercises regularly and no change to level of activity but she feels much worse after the work out. She denies chest pains, SOB, cough, abdominal pain, diarrhea, constipation. Still taking her thyroid medicine and no changes. Denies worsening anxiety or depression. No weight change or new lumps or bumps. Taking her birth control regularly and denies any chance of being pregnant.   PMH, [facility], social history reviewed and updated.   Review of Systems  Constitutional: Positive for fatigue. Negative for activity change, appetite change, chills, fever and unexpected weight change.  HENT: Negative.   Eyes: Negative.   Respiratory: Negative for cough, chest tightness and shortness of breath.   Cardiovascular: Negative for chest pain, palpitations and leg swelling.  Gastrointestinal: Negative for abdominal distention, abdominal pain, constipation, diarrhea, nausea and vomiting.  Musculoskeletal: Negative for arthralgias, back pain, joint swelling, myalgias, neck pain and neck stiffness.  Skin: Negative.   Neurological: Positive for dizziness and light-headedness. Negative for seizures, syncope, speech difficulty, weakness, numbness and headaches.  Psychiatric/Behavioral: Positive for confusion and decreased concentration. Negative for agitation, dysphoric mood, hallucinations, self-injury and suicidal ideas. The patient is not nervous/anxious.         Objective:   Physical Exam  Constitutional: She is oriented to person, place, and time. She appears well-developed and well-nourished.  HENT:  Head: Normocephalic and atraumatic.  Eyes: EOM are normal.  Neck: Normal range of motion.  Cardiovascular: Normal rate and regular rhythm.   Pulmonary/Chest: Effort normal and breath sounds normal. No respiratory distress. She has no wheezes. She has no rales.  Abdominal: Soft. Bowel sounds are normal. She exhibits no distension. There is no tenderness. There is no rebound.  Musculoskeletal: She exhibits no edema.  Neurological: She is alert and oriented to person, place, and time. Coordination normal.  Skin: Skin is warm and dry.  Psychiatric: She has a normal mood and affect.   Vitals:   05/31/16 0859  BP: 128/70  Pulse: 84  Resp: 12  Temp: 98.4 F (36.9 C)  TempSrc: Oral  SpO2: 99%  Weight: 144 lb (65.3 kg)  Height: 5\' 2"  (1.575 m)      Assessment & Plan:

## 2016-05-31 NOTE — Assessment & Plan Note (Signed)
Concern that the prozac is causing her new symptoms. If no metabolic etiology found will stop prozac and replace if needed.

## 2016-05-31 NOTE — Progress Notes (Signed)
Pre visit review using our clinic review tool, if applicable. No additional management support is needed unless otherwise documented below in the visit note. 

## 2016-05-31 NOTE — Patient Instructions (Signed)
We will have you get the blood work done today.   If we do not find any cause we will have you stop the prozac for 2 weeks as this can cause those symptoms.

## 2016-05-31 NOTE — Assessment & Plan Note (Signed)
Needs TSH and free T4 for levels and adjust her synthroid 50 mcg daily as needed.

## 2016-05-31 NOTE — Assessment & Plan Note (Signed)
Concern for side effect of prozac with some depersonalization and pre-syncopal symptoms with lack of energy. Will check labs to rule out metabolic causes including CMP, CBC, thyroid, B12, vitamin D. If no abnormality will stop prozac and then evaluate if she needs an alternative therapy.

## 2016-06-03 ENCOUNTER — Encounter: Payer: Self-pay | Admitting: Internal Medicine

## 2016-06-21 DIAGNOSIS — M6281 Muscle weakness (generalized): Secondary | ICD-10-CM | POA: Diagnosis not present

## 2016-06-21 DIAGNOSIS — M6289 Other specified disorders of muscle: Secondary | ICD-10-CM | POA: Diagnosis not present

## 2016-06-21 DIAGNOSIS — R102 Pelvic and perineal pain: Secondary | ICD-10-CM | POA: Diagnosis not present

## 2016-06-21 DIAGNOSIS — M62838 Other muscle spasm: Secondary | ICD-10-CM | POA: Diagnosis not present

## 2016-07-18 DIAGNOSIS — R102 Pelvic and perineal pain: Secondary | ICD-10-CM | POA: Diagnosis not present

## 2016-07-18 DIAGNOSIS — M62838 Other muscle spasm: Secondary | ICD-10-CM | POA: Diagnosis not present

## 2016-07-18 DIAGNOSIS — M6289 Other specified disorders of muscle: Secondary | ICD-10-CM | POA: Diagnosis not present

## 2016-07-18 DIAGNOSIS — M6281 Muscle weakness (generalized): Secondary | ICD-10-CM | POA: Diagnosis not present

## 2016-07-19 ENCOUNTER — Other Ambulatory Visit: Payer: Self-pay | Admitting: Internal Medicine

## 2016-08-09 ENCOUNTER — Other Ambulatory Visit (INDEPENDENT_AMBULATORY_CARE_PROVIDER_SITE_OTHER): Payer: 59

## 2016-08-09 ENCOUNTER — Ambulatory Visit (INDEPENDENT_AMBULATORY_CARE_PROVIDER_SITE_OTHER): Payer: 59 | Admitting: Family Medicine

## 2016-08-09 VITALS — BP 122/80 | HR 72 | Temp 98.3°F | Ht 62.0 in | Wt 150.0 lb

## 2016-08-09 DIAGNOSIS — D509 Iron deficiency anemia, unspecified: Secondary | ICD-10-CM | POA: Diagnosis not present

## 2016-08-09 DIAGNOSIS — R42 Dizziness and giddiness: Secondary | ICD-10-CM | POA: Diagnosis not present

## 2016-08-09 DIAGNOSIS — R519 Headache, unspecified: Secondary | ICD-10-CM

## 2016-08-09 DIAGNOSIS — R5383 Other fatigue: Secondary | ICD-10-CM

## 2016-08-09 DIAGNOSIS — R51 Headache: Secondary | ICD-10-CM | POA: Diagnosis not present

## 2016-08-09 LAB — HEMOGLOBIN A1C: Hgb A1c MFr Bld: 5.3 % (ref 4.6–6.5)

## 2016-08-09 LAB — FERRITIN: Ferritin: 15.8 ng/mL (ref 10.0–291.0)

## 2016-08-09 NOTE — Progress Notes (Signed)
Pre visit review using our clinic review tool, if applicable. No additional management support is needed unless otherwise documented below in the visit note. 

## 2016-08-09 NOTE — Patient Instructions (Addendum)
You will get a call about your neurology appointment  Please go to the lab. I will send you your results via Mychart  Increase vitamin B12- find a sublingual brand, 5,000 Iu

## 2016-08-09 NOTE — Progress Notes (Signed)
Subjective:    Patient ID: Michelle Waller, female    DOB: September 15, 1990, 26 y.o.   MRN: [id number]  HPI This is a 26 yo female who presents today with continued lightheadedness. Was seen 05/31/16 with presyncope. Has had more "brain fog and grogginess," some intermittent fatigue that is very unusual for her. Describes herself as active and upbeat, but has been unable to do her regular activities lately and is always tired. 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 Has had recent eye exam.Sleeps "hard", doesn't feel rested for about 2 years. Was found to have low ferritin, has been taking otc iron and vitamin B12 (500 units, oral tablet). Has had issues with anxiety in the past but these symptoms are not similar.    Has problems with headaches since childhood, was on preventative betablocker for many years. Headaches behind right eye. Feels fullness. Takes naproxen 400 with good relief. Little caffeine. No nasal drainage, no ear pain.    Past Medical History:  Diagnosis Date  . Eating disorder   . Frequent headaches   . GERD (gastroesophageal reflux disease)   . Hypertension   . Kidney stones   . Ovarian cyst   . UTI (urinary tract infection)    Past Surgical History:  Procedure Laterality Date  . ADENOIDECTOMY    . COLONOSCOPY    . CYSTOSCOPY    . KNEE SURGERY     Family History  Problem Relation Age of Onset  . Depression Mother   . Atrial fibrillation Mother   . Hypertension Father   . Arthritis Maternal Grandmother   . Heart disease Maternal Grandmother   . Cancer Paternal Grandmother        breast  . Hypertension Paternal Grandmother   . Heart disease Paternal Grandfather   . Hypertension Paternal Grandfather   . Diabetes Paternal Grandfather      Social History  Substance Use Topics  . Smoking status: Former Research scientist (life sciences)  . Smokeless tobacco: Never Used  . Alcohol use 0.0 oz/week     Comment: occasionally       Review of Systems Per HPI    Objective:   Physical Exam Physical Exam  Vitals reviewed. Constitutional: Oriented to person, place, and time. Appears well-developed and well-nourished.  HENT:  Head: Normocephalic and atraumatic.  Eyes: Conjunctivae are normal.  Cardiovascular: Normal rate.   Pulmonary/Chest: Effort normal.  Musculoskeletal: Normal range of motion.  Neurological: Alert and oriented to person, place, and time.   Psychiatric: Normal mood and affect. Behavior is normal. Judgment and thought content normal.   BP 122/80 (BP Location: Left Arm, Patient Position: Sitting, Cuff Size: Normal)   Pulse 72   Temp 98.3 F (36.8 C) (Oral)   Ht 5\' 2"  (1.575 m)   Wt 150 lb (68 kg)   SpO2 100%   BMI 27.44 kg/m  Wt Readings from Last 3 Encounters:  08/09/16 150 lb (68 kg)  05/31/16 144 lb (65.3 kg)  02/03/16 137 lb (62.1 kg)       Assessment & Plan:  1. Fatigue, unspecified type - Hemoglobin A1c; Future - Ambulatory referral to Neurology  2. Iron deficiency anemia, unspecified iron deficiency anemia type - Ferritin; Future  3. Recurrent headache - Ambulatory referral to Neurology  4. Episodic lightheadedness - Ambulatory referral to Neurology   Clarene Reamer, FNP-BC  [facility], [facility]  08/10/2016 7:02 AM

## 2016-08-15 ENCOUNTER — Encounter: Payer: Self-pay | Admitting: Neurology

## 2016-08-15 ENCOUNTER — Ambulatory Visit (INDEPENDENT_AMBULATORY_CARE_PROVIDER_SITE_OTHER): Payer: 59 | Admitting: Neurology

## 2016-08-15 VITALS — BP 126/80 | HR 71 | Resp 16 | Ht 62.0 in | Wt 149.5 lb

## 2016-08-15 DIAGNOSIS — R5383 Other fatigue: Secondary | ICD-10-CM

## 2016-08-15 DIAGNOSIS — R55 Syncope and collapse: Secondary | ICD-10-CM | POA: Diagnosis not present

## 2016-08-15 DIAGNOSIS — F419 Anxiety disorder, unspecified: Secondary | ICD-10-CM

## 2016-08-15 DIAGNOSIS — H539 Unspecified visual disturbance: Secondary | ICD-10-CM | POA: Diagnosis not present

## 2016-08-15 DIAGNOSIS — E039 Hypothyroidism, unspecified: Secondary | ICD-10-CM

## 2016-08-15 DIAGNOSIS — G4489 Other headache syndrome: Secondary | ICD-10-CM

## 2016-08-15 MED ORDER — FLUOXETINE HCL 40 MG PO CAPS: 40.0000 mg | ORAL_CAPSULE | Freq: Every day | ORAL | 3 refills | Status: DC

## 2016-08-15 NOTE — Progress Notes (Signed)
[facility]  PATIENT: Michelle Waller DOB: 12/13/1990  REFERRING DOCTOR OR PCP:  Pricilla Holm SOURCE: Patient, notes from PCP, lab results  _________________________________   HISTORICAL  CHIEF COMPLAINT:  Chief Complaint  Patient presents with  . Headache    [name] is here for eval of intermittent fatigue, intermittent h/a right eye, presyncope, onset in March.  She has had an eye exam, new glasses, taking Vitamin B12 and Fe supplements with no improvement in sx.  Orthostatic VS done today/fim  . Fatigue  . Presyncope    HISTORY OF PRESENT ILLNESS:  I had the pleasure seeing you patient, Michelle Waller, at [facility] for neurologic consultation regarding her fatigue, headaches and presyncope.  In March 2018, she began to experience worsening fatigue, visual changes with headache an presyncope.  .   Also, she began to experience pain behind her right eye.   She finds focusing (visual) is more difficult and she tries to limit her driving to just 30 minutes due to the blurriness.    She denies diplopia.   She feels the blurriness is worse OD but she also notes symptoms OS.  The visual symptoms are present upon awakening and do not worsen as the day foes on.   She has noted worse symptoms in the heat.     She has had some more issues with sleep onset insomnia but she has no sleep maintenance issues.    She does not snore.    She has had multiple episodes of lightheadedness x 3-4 months and one episode of presyncope.   In early May, she had a day associated with GI issues including diarrhea and a cold clammy sensation when she thought she was going to lose consciousness.     She has no syncope.       She has had some fatigue x several years and was found to heave hypothyroidism last year.  She never noted any improvement with fatigue after starting levothyroxine.     She has not noted any change in her gait or balance. However, she feels that she  has been significantly less active and would have more difficulty running that she would have last year. She does not note any major change with her bladder function currently but was having some urinary frequency earlier.  She does note anxiety. Although she does not have sadness or crying spells, she has noted apathy. Cognitively, she feels focus and attention are worse.  I reviewed labwork from 05/31/2016. Ferritin is low at 7.7 and B12 is low at 193. Vit D is low normal.  Thyroid tests were normal in 2017.  REVIEW OF SYSTEMS: Constitutional: No fevers, chills, sweats, or change in appetite.  Fatigue, mild sleep onset insomnia Eyes: as above Ear, nose and throat: No hearing loss, ear pain, nasal congestion, sore throat Cardiovascular: No chest pain, palpitations Respiratory: No shortness of breath at rest or with exertion.   No wheezes.  No snoring GastrointestinaI: No nausea, vomiting, diarrhea, abdominal pain, fecal incontinence Genitourinary: No dysuria, urinary retention or frequency.  No nocturia. Musculoskeletal: No neck pain, back pain Integumentary: No rash, pruritus, skin lesions Neurological: as above Psychiatric: No depression at this time.  No anxiety Endocrine: No palpitations, diaphoresis, change in appetite, change in weigh or increased thirst.   Known hypothyroidism  Hematologic/Lymphatic: No anemia, purpura, petechiae. Allergic/Immunologic: No itchy/runny eyes, nasal congestion, recent allergic reactions, rashes  ALLERGIES: Allergies  Allergen Reactions  . Soy Allergy Anaphylaxis  Large amounts cause anaphylaxis Small amounts cause upset stomach  . Medrol [Methylprednisolone]     Facial swelling     HOME MEDICATIONS:  Current Outpatient Prescriptions:  .  EPIPEN 2-PAK 0.3 MG/0.3ML SOAJ injection, USE SUBCUTANEOUSLY AS DIRECTED, Disp: , Rfl: 0 .  FLUoxetine (PROZAC) 20 MG capsule, TAKE 1 CAPSULE BY MOUTH EVERY EVENING., Disp: 90 capsule, Rfl: 3 .   hydrOXYzine (ATARAX/VISTARIL) 10 MG tablet, Take 10 mg by mouth every evening., Disp: , Rfl:  .  levothyroxine (SYNTHROID, LEVOTHROID) 50 MCG tablet, Take 1 tablet (50 mcg total) by mouth daily., Disp: 90 tablet, Rfl: 3 .  LORazepam (ATIVAN) 0.5 MG tablet, TAKE 1/2-1 TABLET BY MOUTH EVERY 8 HOURS AS NEEDED FOR ANXIETY, Disp: 30 tablet, Rfl: 0 .  Norgestimate-Ethinyl Estradiol Triphasic 0.18/0.215/0.25 MG-35 MCG tablet, TAKE 1 TABLET BY MOUTH DAILY., Disp: 84 tablet, Rfl: 2 .  omeprazole (PRILOSEC) 20 MG capsule, TAKE 1 CAPSULE BY MOUTH DAILY., Disp: 90 capsule, Rfl: 3  PAST MEDICAL HISTORY: Past Medical History:  Diagnosis Date  . Eating disorder   . Frequent headaches   . GERD (gastroesophageal reflux disease)   . Kidney stones   . Kidney stones   . Ovarian cyst   . UTI (urinary tract infection)   . Vision abnormalities     PAST SURGICAL HISTORY: Past Surgical History:  Procedure Laterality Date  . ADENOIDECTOMY    . COLONOSCOPY    . CYSTOSCOPY    . KNEE SURGERY      FAMILY HISTORY: Family History  Problem Relation Age of Onset  . Depression Mother   . Atrial fibrillation Mother   . Hypertension Father   . Arthritis Maternal Grandmother   . Heart disease Maternal Grandmother   . Cancer Paternal Grandmother        breast  . Hypertension Paternal Grandmother   . Heart disease Paternal Grandfather   . Hypertension Paternal Grandfather   . Diabetes Paternal Grandfather   . Diabetes Mellitus II Sister   . Hypothyroidism Sister     SOCIAL HISTORY:  Social History   Social History  . Marital status: Single    Spouse name: N/A  . Number of children: N/A  . Years of education: N/A   Occupational History  . Not on file.   Social History Main Topics  . Smoking status: Former Research scientist (life sciences)  . Smokeless tobacco: Never Used  . Alcohol use 0.0 oz/week     Comment: occasionally   . Drug use: No  . Sexual activity: Not on file   Other Topics Concern  . Not on file    Social History Narrative  . No narrative on file     PHYSICAL EXAM  Vitals:   08/15/16 0819  BP: 126/80  Pulse: 71  Resp: 16  Weight: 149 lb 8 oz (67.8 kg)  Height: '5\' 2"'  (1.575 m)    Body mass index is 27.34 kg/m.   General: The patient is well-developed and well-nourished and in no acute distress  Eyes:  Funduscopic exam shows normal optic discs and retinal vessels.  Neck: The neck is supple, no carotid bruits are noted.  The neck is nontender.  Cardiovascular: The heart has a regular rate and rhythm with a normal S1 and S2. There were no murmurs, gallops or rubs. Lungs are clear to auscultation.  Skin: Extremities are without significant edema.  Musculoskeletal:  Back is nontender  Neurologic Exam  Mental status: The patient is alert and oriented x 3 at the  time of the examination. The patient has apparent normal recent and remote memory, with an apparently normal attention span and concentration ability.   Speech is normal.  Cranial nerves: Extraocular movements are full. Pupils are equal, round, and reactive to light and accomodation.  Visual fields are full.  Facial symmetry is present. There is good facial sensation to soft touch bilaterally.Facial strength is normal.  Trapezius and sternocleidomastoid strength is normal. No dysarthria is noted.  The tongue is midline, and the patient has symmetric elevation of the soft palate. No obvious hearing deficits are noted.  Motor:  Muscle bulk is normal.   Tone is normal. Strength is  5 / 5 in all 4 extremities.   Sensory: Sensory testing is intact to pinprick, soft touch and vibration sensation in all 4 extremities.  Coordination: Cerebellar testing reveals good finger-nose-finger and heel-to-shin bilaterally.  Gait and station: Station is normal.   Gait is normal. Tandem gait is slightly wide. Romberg is negative.   Reflexes: Deep tendon reflexes are symmetric and normal bilaterally.   Plantar responses are  flexor.    DIAGNOSTIC DATA (LABS, IMAGING, TESTING) - I reviewed patient records, labs, notes, testing and imaging myself where available.  Lab Results  Component Value Date   WBC 3.6 (L) 05/31/2016   HGB 13.1 05/31/2016   HCT 39.0 05/31/2016   MCV 86.2 05/31/2016   PLT 219.0 05/31/2016      Component Value Date/Time   NA 138 05/31/2016 0918   K 4.5 05/31/2016 0918   CL 105 05/31/2016 0918   CO2 27 05/31/2016 0918   GLUCOSE 90 05/31/2016 0918   BUN 13 05/31/2016 0918   CREATININE 0.73 05/31/2016 0918   CALCIUM 8.9 05/31/2016 0918   PROT 7.0 05/31/2016 0918   ALBUMIN 3.8 05/31/2016 0918   AST 21 05/31/2016 0918   ALT 18 05/31/2016 0918   ALKPHOS 54 05/31/2016 0918   BILITOT 0.4 05/31/2016 0918   No results found for: CHOL, HDL, LDLCALC, LDLDIRECT, TRIG, CHOLHDL Lab Results  Component Value Date   HGBA1C 5.3 08/09/2016   Lab Results  Component Value Date   VITAMINB12 193 (L) 05/31/2016   Lab Results  Component Value Date   TSH 2.72 05/31/2016       ASSESSMENT AND PLAN  Hypothyroidism, unspecified type - Plan: Thyroid Panel With TSH  Vision disturbance - Plan: Thyroid Panel With TSH, MR BRAIN W WO CONTRAST  Other headache syndrome - Plan: MR BRAIN W WO CONTRAST  Anxiety  Pre-syncope  Other fatigue - Plan: Thyroid Panel With TSH, Sedimentation rate, ANA w/Reflex   In summary, Tenna Lacko is a 26 year old woman with several symptoms including visual disturbance with headache, worsening fatigue, presyncope and anxiety.   She was recently found to be deficient in vitamin B12 and iron and this could certainly be playing some role in her symptoms. We will also check a thyroid panel to make sure that her current dose of supplementation is adequate and I'll rule out colitis with ESR and ANA that could also be contributing to her fatigue. Because of the visual disturbance and headache, we need to check an MRI of the brain to make sure that there is not a mass  lesion or inflammatory etiology of her symptoms that would require more specific treatment.   She does have some anxiety which could be playing a role as well and I will increase her Prozac to 40 mg.  She will return to see me in 2 months or  sooner if there are new or worsening neurologic neurologic symptoms. At the next visit we will also check a vitamin B12 to make sure that she has returned to a normal range. If not, she will need to be switched to IM supplementation and get further evaluation.   If the episodes of presyncope worsen, we'll need to consider alternative diagnoses like orthostatic hypotension, POTS or cardiac etiology.    Thank you for asking me to see Mrs. Tomma Lightning. Please let me know if I can be of further assistance with her or other patients in the future.   Richard A. Felecia Shelling, MD, [facility] 3/47/5830, 7:46 AM Certified in Neurology, Clinical Neurophysiology, Sleep Medicine, Pain Medicine and Neuroimaging  [facility] [address] [place], [place] [address] [phone]

## 2016-08-17 LAB — ENA+DNA/DS+SJORGEN'S
ENA RNP Ab: 0.3 AI (ref 0.0–0.9)
ENA SM Ab Ser-aCnc: <0.2 AI (ref 0.0–0.9)
ENA SSA (RO) Ab: >8 AI — ABNORMAL HIGH (ref 0.0–0.9)
ENA SSB (LA) Ab: 0.4 AI (ref 0.0–0.9)
dsDNA Ab: 1 IU/mL (ref 0–9)

## 2016-08-17 LAB — THYROID PANEL WITH TSH
Free Thyroxine Index: 2.3 (ref 1.2–4.9)
T3 Uptake Ratio: 19 % — ABNORMAL LOW (ref 24–39)
T4, Total: 11.9 ug/dL (ref 4.5–12.0)
TSH: 1.76 u[IU]/mL (ref 0.450–4.500)

## 2016-08-17 LAB — ANA W/REFLEX: Anti Nuclear Antibody(ANA): POSITIVE — AB

## 2016-08-17 LAB — SEDIMENTATION RATE: Sed Rate: 7 mm/hr (ref 0–32)

## 2016-08-18 ENCOUNTER — Telehealth: Payer: Self-pay | Admitting: Internal Medicine

## 2016-08-18 NOTE — Telephone Encounter (Signed)
levothyroxine (SYNTHROID, LEVOTHROID) 50 MCG tablet   Patient is requesting to have the name brand of this medication. The Synthroid. Please advise. Thank you.

## 2016-08-21 MED ORDER — SYNTHROID 50 MCG PO TABS: 50.0000 ug | ORAL_TABLET | Freq: Every day | ORAL | 2 refills | Status: DC

## 2016-08-21 NOTE — Telephone Encounter (Signed)
Can we send this in, please advise. Not sure how to make sure its name brand

## 2016-08-21 NOTE — Telephone Encounter (Signed)
Medication sent to pharmacy  

## 2016-08-28 ENCOUNTER — Telehealth: Payer: Self-pay | Admitting: Neurology

## 2016-08-28 NOTE — Telephone Encounter (Signed)
I called to go over the lab results and left a message.  The ANA was low positive and other labs were normal.   This is most likely false positive but we will recheck this next visit if she is not doing any better.     TSH was normal.

## 2016-08-29 NOTE — Telephone Encounter (Signed)
Pt is returning call and I was able to discuss the lab results with her. She had no further questions and she verbalized understanding.

## 2016-08-29 NOTE — Telephone Encounter (Signed)
Called pt to discuss lab results. No answer at this time. LVM for pt to return call.

## 2016-09-05 DIAGNOSIS — N3010 Interstitial cystitis (chronic) without hematuria: Secondary | ICD-10-CM | POA: Diagnosis not present

## 2016-09-06 ENCOUNTER — Ambulatory Visit (INDEPENDENT_AMBULATORY_CARE_PROVIDER_SITE_OTHER): Payer: 59

## 2016-09-06 DIAGNOSIS — H539 Unspecified visual disturbance: Secondary | ICD-10-CM | POA: Diagnosis not present

## 2016-09-06 DIAGNOSIS — G4489 Other headache syndrome: Secondary | ICD-10-CM

## 2016-09-06 MED ORDER — GADOPENTETATE DIMEGLUMINE 469.01 MG/ML IV SOLN: 13.0000 mL | Freq: Once | INTRAVENOUS | Status: DC | PRN

## 2016-09-11 ENCOUNTER — Telehealth: Payer: Self-pay | Admitting: *Deleted

## 2016-09-11 NOTE — Telephone Encounter (Signed)
Michelle Waller (identified vm) that per RAS, MRI brain is normal.  She does  not need to return this call unless she  has questions/fim

## 2016-09-11 NOTE — Telephone Encounter (Signed)
-----   Message from Britt Bottom, MD sent at 09/10/2016  8:08 PM EDT ----- Please let her know that the MRI of the brain is normal.

## 2016-09-22 ENCOUNTER — Other Ambulatory Visit: Payer: Self-pay | Admitting: Internal Medicine

## 2016-09-26 DIAGNOSIS — Z113 Encounter for screening for infections with a predominantly sexual mode of transmission: Secondary | ICD-10-CM | POA: Diagnosis not present

## 2016-09-26 DIAGNOSIS — R87612 Low grade squamous intraepithelial lesion on cytologic smear of cervix (LGSIL): Secondary | ICD-10-CM | POA: Diagnosis not present

## 2016-09-26 DIAGNOSIS — R875 Abnormal microbiological findings in specimens from female genital organs: Secondary | ICD-10-CM | POA: Diagnosis not present

## 2016-09-26 DIAGNOSIS — Z124 Encounter for screening for malignant neoplasm of cervix: Secondary | ICD-10-CM | POA: Diagnosis not present

## 2016-09-26 DIAGNOSIS — Z01419 Encounter for gynecological examination (general) (routine) without abnormal findings: Secondary | ICD-10-CM | POA: Diagnosis not present

## 2016-10-06 ENCOUNTER — Encounter: Payer: Self-pay | Admitting: Family Medicine

## 2016-10-10 ENCOUNTER — Emergency Department (HOSPITAL_COMMUNITY)
Admission: EM | Admit: 2016-10-10 | Discharge: 2016-10-10 | Disposition: A | Discharge status: Home / Self Care | Payer: 59 | Attending: Emergency Medicine | Admitting: Emergency Medicine

## 2016-10-10 ENCOUNTER — Encounter (HOSPITAL_COMMUNITY): Payer: Self-pay | Admitting: *Deleted

## 2016-10-10 ENCOUNTER — Emergency Department (HOSPITAL_COMMUNITY): Payer: 59

## 2016-10-10 DIAGNOSIS — Z79899 Other long term (current) drug therapy: Secondary | ICD-10-CM | POA: Diagnosis not present

## 2016-10-10 DIAGNOSIS — Z87891 Personal history of nicotine dependence: Secondary | ICD-10-CM | POA: Insufficient documentation

## 2016-10-10 DIAGNOSIS — R079 Chest pain, unspecified: Secondary | ICD-10-CM | POA: Diagnosis not present

## 2016-10-10 DIAGNOSIS — R0602 Shortness of breath: Secondary | ICD-10-CM | POA: Diagnosis not present

## 2016-10-10 DIAGNOSIS — R0789 Other chest pain: Secondary | ICD-10-CM | POA: Insufficient documentation

## 2016-10-10 LAB — D-DIMER, QUANTITATIVE (NOT AT ARMC): D-Dimer, Quant: 0.34 ug/mL-FEU (ref 0.00–0.50)

## 2016-10-10 LAB — BASIC METABOLIC PANEL
Anion gap: 8 (ref 5–15)
BUN: 8 mg/dL (ref 6–20)
CO2: 23 mmol/L (ref 22–32)
Calcium: 8.9 mg/dL (ref 8.9–10.3)
Chloride: 104 mmol/L (ref 101–111)
Creatinine, Ser: 0.68 mg/dL (ref 0.44–1.00)
GFR calc Af Amer: >60 mL/min (ref >60)
GFR calc non Af Amer: >60 mL/min (ref >60)
Glucose, Bld: 98 mg/dL (ref 65–99)
Potassium: 3.5 mmol/L (ref 3.5–5.1)
Sodium: 135 mmol/L (ref 135–145)

## 2016-10-10 LAB — I-STAT TROPONIN, ED: Troponin i, poc: 0 ng/mL (ref 0.00–0.08)

## 2016-10-10 LAB — CBC
HCT: 38.5 % (ref 36.0–46.0)
Hemoglobin: 13.2 g/dL (ref 12.0–15.0)
MCH: 29.9 pg (ref 26.0–34.0)
MCHC: 34.3 g/dL (ref 30.0–36.0)
MCV: 87.1 fL (ref 78.0–100.0)
Platelets: 196 10*3/uL (ref 150–400)
RBC: 4.42 MIL/uL (ref 3.87–5.11)
RDW: 12 % (ref 11.5–15.5)
WBC: 3.1 10*3/uL — ABNORMAL LOW (ref 4.0–10.5)

## 2016-10-10 LAB — I-STAT BETA HCG BLOOD, ED (MC, WL, AP ONLY): I-stat hCG, quantitative: <5 m[IU]/mL (ref <5)

## 2016-10-10 MED ORDER — GI COCKTAIL ~~LOC~~
30.0000 mL | Freq: Once | ORAL | Status: AC
  Administered 2016-10-10: 30 mL via ORAL
  Filled 2016-10-10: qty 30

## 2016-10-10 NOTE — Discharge Instructions (Signed)
Try zantac 150mg twice a day.  ° ° °

## 2016-10-10 NOTE — ED Triage Notes (Signed)
Pt woke up from sleep this morning with SOB and centralized chest pain. Reports sharp chest pain nonradiating, denies nausea.

## 2016-10-10 NOTE — ED Notes (Signed)
Pt ambulatory at DC, verbalizes DC teaching. NAD.

## 2016-10-10 NOTE — ED Notes (Signed)
Pt reports she was awoken from sleep around 0430 today with a grabbing central CP and SOB with no radiation. PT reports she "wanted to go back to sleep" so she took some of her prescription ativan.

## 2016-10-10 NOTE — ED Provider Notes (Signed)
[facility] DEPT Provider Note   CSN: [id number] Arrival date & time: 10/10/16  0608     History   Chief Complaint Chief Complaint  Patient presents with  . Chest Pain    HPI Michelle Waller is a 26 y.o. female.  26 yo F with a chief complaint of chest pain. This will grow from sleep. Described as a sharp burning pain. Had some associated shortness of breath with this. Denies nausea or vomiting. Denies anything that make it better or worse. Denies radiation of the pain. Denies lower extremity edema denies hemoptysis denies prior PE. Has had a DVT in the past. Also is on oral contraceptives. Denies family history of MI. Denies hypertension hyperlipidemia diabetes smoking or cocaine abuse.   The history is provided by the patient.  Chest Pain   This is a recurrent problem. The current episode started 3 to 5 hours ago. The problem occurs constantly. The problem has not changed since onset.The pain is associated with rest. The pain is present in the substernal region. The pain is at a severity of 8/10. The pain is severe. The quality of the pain is described as brief and burning. The pain does not radiate. Duration of episode(s) is 2 hours. Associated symptoms include shortness of breath. Pertinent negatives include no dizziness, no fever, no headaches, no nausea, no palpitations and no vomiting. She has tried nothing for the symptoms. The treatment provided no relief.  Pertinent negatives for past medical history include no CAD, no diabetes, no DVT, no hyperlipidemia, no hypertension, no MI and no PE.  Pertinent negatives for family medical history include: no early MI.    Past Medical History:  Diagnosis Date  . Eating disorder   . Frequent headaches   . GERD (gastroesophageal reflux disease)   . Kidney stones   . Kidney stones   . Ovarian cyst   . UTI (urinary tract infection)   . Vision abnormalities     Patient Active Problem List   Diagnosis Date Noted  . Vision  disturbance 08/15/2016  . Other headache syndrome 08/15/2016  . Anxiety 08/15/2016  . Pre-syncope 05/31/2016  . Hypothyroidism 02/03/2016  . Panic attack 02/11/2015  . GERD (gastroesophageal reflux disease) 02/11/2015  . Acne 02/11/2015    Past Surgical History:  Procedure Laterality Date  . ADENOIDECTOMY    . COLONOSCOPY    . CYSTOSCOPY    . KNEE SURGERY      OB History    Gravida Para Term Preterm AB Living   0 0 0 0 0 0   SAB TAB Ectopic Multiple Live Births   0 0 0 0         Home Medications    Prior to Admission medications   Medication Sig Start Date End Date Taking? Authorizing Provider  EPIPEN 2-PAK 0.3 MG/0.3ML SOAJ injection USE SUBCUTANEOUSLY AS DIRECTED 05/28/14  Yes [provider]  FLUoxetine (PROZAC) 40 MG capsule Take 1 capsule (40 mg total) by mouth daily. 08/15/16  Yes Sater, Nanine Means, MD  hydrOXYzine (ATARAX/VISTARIL) 10 MG tablet Take 10 mg by mouth every evening.   Yes [provider]  LORazepam (ATIVAN) 0.5 MG tablet TAKE 1/2-1 TABLET BY MOUTH EVERY 8 HOURS AS NEEDED FOR ANXIETY 04/05/16  Yes Hoyt Koch, MD  Norgestimate-Ethinyl Estradiol Triphasic 0.18/0.215/0.25 MG-35 MCG tablet TAKE 1 TABLET BY MOUTH DAILY. 07/19/16  Yes Golden Circle, FNP  omeprazole (PRILOSEC) 20 MG capsule TAKE 1 CAPSULE BY MOUTH DAILY. 04/05/16  Yes Crawford,  Real Cons, MD  SYNTHROID 50 MCG tablet Take 1 tablet (50 mcg total) by mouth daily before breakfast. 08/21/16  Yes Golden Circle, FNP    Family History Family History  Problem Relation Age of Onset  . Depression Mother   . Atrial fibrillation Mother   . Hypertension Father   . Arthritis Maternal Grandmother   . Heart disease Maternal Grandmother   . Cancer Paternal Grandmother        breast  . Hypertension Paternal Grandmother   . Heart disease Paternal Grandfather   . Hypertension Paternal Grandfather   . Diabetes Paternal Grandfather   . Diabetes Mellitus II Sister   .  Hypothyroidism Sister     Social History Social History  Substance Use Topics  . Smoking status: Former Research scientist (life sciences)  . Smokeless tobacco: Never Used  . Alcohol use 0.0 oz/week     Comment: occasionally      Allergies   Soy allergy and Medrol [methylprednisolone]   Review of Systems Review of Systems  Constitutional: Negative for chills and fever.  HENT: Negative for congestion and rhinorrhea.   Eyes: Negative for redness and visual disturbance.  Respiratory: Positive for shortness of breath. Negative for wheezing.   Cardiovascular: Positive for chest pain. Negative for palpitations.  Gastrointestinal: Negative for nausea and vomiting.  Genitourinary: Negative for dysuria and urgency.  Musculoskeletal: Negative for arthralgias and myalgias.  Skin: Negative for pallor and wound.  Neurological: Negative for dizziness and headaches.     Physical Exam Updated Vital Signs BP 107/68   Pulse 68   Temp 98.7 F (37.1 C) (Oral)   Resp 18   LMP 10/08/2016   SpO2 100%   Physical Exam  Constitutional: She is oriented to person, place, and time. She appears well-developed and well-nourished. No distress.  HENT:  Head: Normocephalic and atraumatic.  Eyes: Pupils are equal, round, and reactive to light. EOM are normal.  Neck: Normal range of motion. Neck supple.  Cardiovascular: Normal rate and regular rhythm.  Exam reveals no gallop and no friction rub.   No murmur heard. Pulmonary/Chest: Effort normal. She has no wheezes. She has no rales.  Abdominal: Soft. She exhibits no distension and no mass. There is no tenderness. There is no guarding.  Musculoskeletal: She exhibits no edema or tenderness.  Neurological: She is alert and oriented to person, place, and time.  Skin: Skin is warm and dry. She is not diaphoretic.  Psychiatric: She has a normal mood and affect. Her behavior is normal.  Nursing note and vitals reviewed.    ED Treatments / Results  Labs (all labs ordered are  listed, but only abnormal results are displayed) Labs Reviewed  CBC - Abnormal; Notable for the following:       Result Value   WBC 3.1 (*)    All other components within normal limits  BASIC METABOLIC PANEL  D-DIMER, QUANTITATIVE (NOT AT [facility])  I-STAT TROPONIN, ED  I-STAT BETA HCG BLOOD, ED ([facility], [facility], [facility] ONLY)    EKG  EKG Interpretation  Date/Time:  Tuesday October 10 2016 06:12:46 EDT Ventricular Rate:  84 PR Interval:  104 QRS Duration: 74 QT Interval:  352 QTC Calculation: 415 R Axis:   81 Text Interpretation:  Sinus rhythm with sinus arrhythmia with short PR Otherwise normal ECG No old tracing to compare Confirmed by Deno Etienne [phone]) on 10/10/2016 9:09:38 AM       Radiology Dg Chest 2 View  Result Date: 10/10/2016 CLINICAL DATA:  Patient  woke up with shortness of breath and centralized chest pain this morning. Former smoker. EXAM: CHEST  2 VIEW COMPARISON:  None. FINDINGS: The heart size and mediastinal contours are within normal limits. Both lungs are clear. The visualized skeletal structures are unremarkable. IMPRESSION: No active cardiopulmonary disease. Electronically Signed   By: Lucienne Capers M.D.   On: 10/10/2016 06:51    Procedures Procedures (including critical care time)  Medications Ordered in ED Medications  gi cocktail (Maalox,Lidocaine,Donnatal) (30 mLs Oral Given 10/10/16 1015)     Initial Impression / Assessment and Plan / ED Course  I have reviewed the triage vital signs and the nursing notes.  Pertinent labs & imaging results that were available during my care of the patient were reviewed by me and considered in my medical decision making (see chart for details).     26 yo F With a chief complaint of chest pain. Likely GI based on history. Patient does have a history of a DVT as well as on estrogen. Will obtain a d-dimer.  DDimer negative, d/c home.   11:04 AM:  I have discussed the diagnosis/risks/treatment options with the patient and  family and believe the pt to be eligible for discharge home to follow-up with PCP. We also discussed returning to the ED immediately if new or worsening sx occur. We discussed the sx which are most concerning (e.g., sudden worsening pain, fever, inability to tolerate by mouth) that necessitate immediate return. Medications administered to the patient during their visit and any new prescriptions provided to the patient are listed below.  Medications given during this visit Medications  gi cocktail (Maalox,Lidocaine,Donnatal) (30 mLs Oral Given 10/10/16 1015)     The patient appears reasonably screen and/or stabilized for discharge and I doubt any other medical condition or other [facility] requiring further screening, evaluation, or treatment in the ED at this time prior to discharge.    Final Clinical Impressions(s) / ED Diagnoses   Final diagnoses:  Atypical chest pain    New Prescriptions New Prescriptions   No medications on file     Deno Etienne, DO 10/10/16 1104

## 2016-10-11 DIAGNOSIS — R079 Chest pain, unspecified: Secondary | ICD-10-CM | POA: Diagnosis not present

## 2016-10-16 ENCOUNTER — Ambulatory Visit: Payer: 59 | Admitting: Neurology

## 2016-10-16 ENCOUNTER — Encounter: Payer: Self-pay | Admitting: Internal Medicine

## 2016-10-31 ENCOUNTER — Ambulatory Visit (INDEPENDENT_AMBULATORY_CARE_PROVIDER_SITE_OTHER): Payer: 59 | Admitting: Neurology

## 2016-10-31 ENCOUNTER — Encounter: Payer: Self-pay | Admitting: Neurology

## 2016-10-31 VITALS — BP 121/72 | HR 76 | Resp 14 | Ht 62.0 in | Wt 149.0 lb

## 2016-10-31 DIAGNOSIS — G4489 Other headache syndrome: Secondary | ICD-10-CM

## 2016-10-31 DIAGNOSIS — R7689 Other specified abnormal immunological findings in serum: Secondary | ICD-10-CM | POA: Insufficient documentation

## 2016-10-31 DIAGNOSIS — F419 Anxiety disorder, unspecified: Secondary | ICD-10-CM | POA: Diagnosis not present

## 2016-10-31 DIAGNOSIS — R55 Syncope and collapse: Secondary | ICD-10-CM

## 2016-10-31 DIAGNOSIS — R768 Other specified abnormal immunological findings in serum: Secondary | ICD-10-CM | POA: Insufficient documentation

## 2016-10-31 NOTE — Progress Notes (Signed)
[facility]  PATIENT: Michelle Waller DOB: 1990-04-12  REFERRING DOCTOR OR PCP:  Pricilla Holm SOURCE: Patient, notes from PCP, lab results  _________________________________   HISTORICAL  CHIEF COMPLAINT:  Chief Complaint  Patient presents with  . Headache    Sts. all sx. have resolved.  She increased Vit. B12 dose, started taking Vit. D, stopped birth control, and decreased Vistaril to PRN. Feels regular use of Vistaril interacted with Prozac and contributed to sx. Here today for repeat labwork--ANA/fim  . Fatigue  . Decreased Visual Acuity  . Presyncope    HISTORY OF PRESENT ILLNESS:   Michelle Waller Is a 26 year old woman who was experiencing fatigue, headaches and presyncope.  Update 10/31/2016: She has not had any recent episodes of presyncope.  She gets some headaches, occurring twice a week.   Tylenol usually helps.   HA's are leikely tension type with occipital pain, no N/V and no photo/phonophobia.  She also is using a bite guard now.  Fatigue improved off Vistaril.    Anxiety is a little better on the higher dose of Prozac.    She underwent an MRI of the brain 09/06/2016. The images were personally reviewed. It is normal. The ANA was positive and further analysis showed that the SSA/Ro antibody was greater than 8 (less than 0.9 was normal).   T3 was mildly low but TSH was fine.  Also since then, she increased her vitamin B12 and started taking vitamin D. Additionally she may changes in her medication include stopping the birth control pills deducing the amount of Vistaril. She continues on Prozac (we increased to 40 mg) and when necessary Ativan.   She is also on Prilosec and Synthroid.  _________________________________ From 07/26/2016:    In March 2018, she began to experience worsening fatigue, visual changes with headache an presyncope.  .   Also, she began to experience pain behind her right eye.   She finds focusing (visual) is more difficult and  she tries to limit her driving to just 30 minutes due to the blurriness.    She denies diplopia.   She feels the blurriness is worse OD but she also notes symptoms OS.  The visual symptoms are present upon awakening and do not worsen as the day foes on.   She has noted worse symptoms in the heat.     She has had some more issues with sleep onset insomnia but she has no sleep maintenance issues.    She does not snore.    She has had multiple episodes of lightheadedness x 3-4 months and one episode of presyncope.   In early May, she had a day associated with GI issues including diarrhea and a cold clammy sensation when she thought she was going to lose consciousness.     She has no syncope.       She has had some fatigue x several years and was found to heave hypothyroidism last year.  She never noted any improvement with fatigue after starting levothyroxine.     She has not noted any change in her gait or balance. However, she feels that she has been significantly less active and would have more difficulty running that she would have last year. She does not note any major change with her bladder function currently but was having some urinary frequency earlier.  She does note anxiety. Although she does not have sadness or crying spells, she has noted apathy. Cognitively, she feels focus and attention are worse.  I reviewed labwork from 05/31/2016. Ferritin is low at 7.7 and B12 is low at 193. Vit D is low normal.  Thyroid tests were normal in 2017.  REVIEW OF SYSTEMS: Constitutional: No fevers, chills, sweats, or change in appetite.  Fatigue, mild sleep onset insomnia Eyes: as above Ear, nose and throat: No hearing loss, ear pain, nasal congestion, sore throat Cardiovascular: No chest pain, palpitations Respiratory: No shortness of breath at rest or with exertion.   No wheezes.  No snoring GastrointestinaI: No nausea, vomiting, diarrhea, abdominal pain, fecal incontinence Genitourinary: No  dysuria, urinary retention or frequency.  No nocturia. Musculoskeletal: No neck pain, back pain Integumentary: No rash, pruritus, skin lesions Neurological: as above Psychiatric: No depression at this time.  No anxiety Endocrine: No palpitations, diaphoresis, change in appetite, change in weigh or increased thirst.   Known hypothyroidism  Hematologic/Lymphatic: No anemia, purpura, petechiae. Allergic/Immunologic: No itchy/runny eyes, nasal congestion, recent allergic reactions, rashes  ALLERGIES: Allergies  Allergen Reactions  . Soy Allergy Anaphylaxis    Large amounts cause anaphylaxis Small amounts cause upset stomach  . Medrol [Methylprednisolone]     Facial swelling     HOME MEDICATIONS:  Current Outpatient Prescriptions:  .  EPIPEN 2-PAK 0.3 MG/0.3ML SOAJ injection, USE SUBCUTANEOUSLY AS DIRECTED, Disp: , Rfl: 0 .  FLUoxetine (PROZAC) 40 MG capsule, Take 1 capsule (40 mg total) by mouth daily., Disp: 90 capsule, Rfl: 3 .  LORazepam (ATIVAN) 0.5 MG tablet, TAKE 1/2-1 TABLET BY MOUTH EVERY 8 HOURS AS NEEDED FOR ANXIETY, Disp: 30 tablet, Rfl: 0 .  omeprazole (PRILOSEC) 20 MG capsule, TAKE 1 CAPSULE BY MOUTH DAILY., Disp: 90 capsule, Rfl: 3 .  SYNTHROID 50 MCG tablet, Take 1 tablet (50 mcg total) by mouth daily before breakfast., Disp: 30 tablet, Rfl: 2 No current facility-administered medications for this visit.   Facility-Administered Medications Ordered in Other Visits:  .  gadopentetate dimeglumine (MAGNEVIST) injection 13 mL, 13 mL, Intravenous, Once PRN, Joncarlo Friberg, Nanine Means, MD  PAST MEDICAL HISTORY: Past Medical History:  Diagnosis Date  . Eating disorder   . Frequent headaches   . GERD (gastroesophageal reflux disease)   . Kidney stones   . Kidney stones   . Ovarian cyst   . UTI (urinary tract infection)   . Vision abnormalities     PAST SURGICAL HISTORY: Past Surgical History:  Procedure Laterality Date  . ADENOIDECTOMY    . COLONOSCOPY    . CYSTOSCOPY      . KNEE SURGERY      FAMILY HISTORY: Family History  Problem Relation Age of Onset  . Depression Mother   . Atrial fibrillation Mother   . Hypertension Father   . Arthritis Maternal Grandmother   . Heart disease Maternal Grandmother   . Cancer Paternal Grandmother        breast  . Hypertension Paternal Grandmother   . Heart disease Paternal Grandfather   . Hypertension Paternal Grandfather   . Diabetes Paternal Grandfather   . Diabetes Mellitus II Sister   . Hypothyroidism Sister     SOCIAL HISTORY:  Social History   Social History  . Marital status: Single    Spouse name: N/A  . Number of children: N/A  . Years of education: N/A   Occupational History  . Not on file.   Social History Main Topics  . Smoking status: Former Research scientist (life sciences)  . Smokeless tobacco: Never Used  . Alcohol use 0.0 oz/week     Comment: occasionally   . Drug  use: No  . Sexual activity: Not on file   Other Topics Concern  . Not on file   Social History Narrative  . No narrative on file     PHYSICAL EXAM  Vitals:   10/31/16 0814  BP: 121/72  Pulse: 76  Resp: 14  Weight: 149 lb (67.6 kg)  Height: 5\' 2"  (1.575 m)    Body mass index is 27.25 kg/m.   General: The patient is well-developed and well-nourished and in no acute distress  Eyes:  Normal sclera.  Neck: The neck is supple and nontender. No evidence of salivary gland enlargement   Neurologic Exam  Mental status: The patient is alert and oriented x 3 at the time of the examination. The patient has apparent normal recent and remote memory, with an apparently normal attention span and concentration ability.   Speech is normal.  Cranial nerves: Extraocular movements are full. Facial strength and sensation is normal. Trapezius strength is strong.  The tongue is midline, and the patient has symmetric elevation of the soft palate. No obvious hearing deficits are noted.  Motor:  Muscle bulk is normal.   Tone is normal. Strength is   5 / 5 in all 4 extremities.   Sensory: Sensory testing is intact to touch in all 4 extremities.  Coordination: Cerebellar testing reveals good finger-nose-finger  Gait and station: Station is normal.   Gait is normal. Tandem gait is normal. Romberg is negative.   Reflexes: Deep tendon reflexes are symmetric and normal bilaterally.       DIAGNOSTIC DATA (LABS, IMAGING, TESTING) - I reviewed patient records, labs, notes, testing and imaging myself where available.  Lab Results  Component Value Date   WBC 3.1 (L) 10/10/2016   HGB 13.2 10/10/2016   HCT 38.5 10/10/2016   MCV 87.1 10/10/2016   PLT 196 10/10/2016      Component Value Date/Time   NA 135 10/10/2016 0616   K 3.5 10/10/2016 0616   CL 104 10/10/2016 0616   CO2 23 10/10/2016 0616   GLUCOSE 98 10/10/2016 0616   BUN 8 10/10/2016 0616   CREATININE 0.68 10/10/2016 0616   CALCIUM 8.9 10/10/2016 0616   PROT 7.0 05/31/2016 0918   ALBUMIN 3.8 05/31/2016 0918   AST 21 05/31/2016 0918   ALT 18 05/31/2016 0918   ALKPHOS 54 05/31/2016 0918   BILITOT 0.4 05/31/2016 0918   GFRNONAA >60 10/10/2016 0616   GFRAA >60 10/10/2016 0616   No results found for: CHOL, HDL, LDLCALC, LDLDIRECT, TRIG, CHOLHDL Lab Results  Component Value Date   HGBA1C 5.3 08/09/2016   Lab Results  Component Value Date   VITAMINB12 193 (L) 05/31/2016   Lab Results  Component Value Date   TSH 1.760 08/15/2016       ASSESSMENT AND PLAN  Other headache syndrome  ANA positive - Plan: Sjogren's syndrome antibods(ssa + ssb)  Pre-syncope  Anxiety   1.   We will recheck the Sjogren's antibodies. She does not have any evidence of Sjogren's disease clinically and I went over some of the symptoms with her. If she does develop any of these I would want her to see rheumatology. 2.    Continue Prozac 40 mg 3.     Stay active and exercises tolerated. 4.    She will return to see me as needed if she has new or worsening neurologic  symptoms.  Daryle Boyington A. Felecia Shelling, MD, [facility] 01/0/9323, 5:57 AM Certified in Neurology, Clinical Neurophysiology, Sleep Medicine, Pain Medicine  and Neuroimaging  [facility] [address] [place], [place] [address] [phone]

## 2016-11-01 LAB — SJOGREN'S SYNDROME ANTIBODS(SSA + SSB)
ENA SSA (RO) Ab: >8 AI — ABNORMAL HIGH (ref 0.0–0.9)
ENA SSB (LA) Ab: 0.5 AI (ref 0.0–0.9)

## 2016-11-02 ENCOUNTER — Ambulatory Visit (INDEPENDENT_AMBULATORY_CARE_PROVIDER_SITE_OTHER): Payer: 59 | Admitting: Internal Medicine

## 2016-11-02 ENCOUNTER — Encounter: Payer: Self-pay | Admitting: Internal Medicine

## 2016-11-02 VITALS — BP 120/80 | HR 84 | Temp 98.1°F | Ht 62.0 in | Wt 147.0 lb

## 2016-11-02 DIAGNOSIS — R1013 Epigastric pain: Secondary | ICD-10-CM

## 2016-11-02 MED ORDER — PANTOPRAZOLE SODIUM 40 MG PO TBEC: 40.0000 mg | DELAYED_RELEASE_TABLET | Freq: Every day | ORAL | 3 refills | Status: DC

## 2016-11-02 NOTE — Patient Instructions (Signed)
We will get the ultrasound of the stomach.   We have sent in protonix instead of omeprazole.

## 2016-11-03 ENCOUNTER — Encounter: Payer: Self-pay | Admitting: Internal Medicine

## 2016-11-03 DIAGNOSIS — R1013 Epigastric pain: Secondary | ICD-10-CM | POA: Insufficient documentation

## 2016-11-03 NOTE — Progress Notes (Signed)
   Subjective:    Patient ID: Michelle Waller, female    DOB: November 26, 1990, 26 y.o.   MRN: [id number]  HPI The patient is a 26 YO female coming in for follow up of ER visit for chest pain. They thought it was related to GERD. She is taking omeprazole daily. She normally can identify triggers and know that she will have problems. This woke her in pain 10/10 and she sought care. Got GI cocktail which helped possibly. Lasted about 1 hour. She has not had episode like that again. Several episodes of pressure epigastric region. Her typical GERD symptoms previously had been higher up burning in her esophagus near throat. Denies constipation, diarrhea, blood in stool.   Review of Systems  Constitutional: Negative.   Respiratory: Negative for cough, chest tightness and shortness of breath.   Cardiovascular: Negative for chest pain, palpitations and leg swelling.  Gastrointestinal: Positive for abdominal pain. Negative for abdominal distention, anal bleeding, blood in stool, constipation, diarrhea, nausea and vomiting.  Musculoskeletal: Negative.   Skin: Negative.       Objective:   Physical Exam  Constitutional: She is oriented to person, place, and time. She appears well-developed and well-nourished.  HENT:  Head: Normocephalic and atraumatic.  Eyes: EOM are normal.  Neck: Normal range of motion.  Cardiovascular: Normal rate and regular rhythm.   Pulmonary/Chest: Effort normal and breath sounds normal. No respiratory distress. She has no wheezes. She has no rales.  Abdominal: Soft. Bowel sounds are normal. She exhibits no distension and no mass. There is no tenderness. There is no rebound and no guarding.  Musculoskeletal: She exhibits no edema.  Neurological: She is alert and oriented to person, place, and time. Coordination normal.  Skin: Skin is warm and dry.   Vitals:   11/02/16 1539  BP: 120/80  Pulse: 84  Temp: 98.1 F (36.7 C)  TempSrc: Oral  SpO2: 99%  Weight: 147 lb (66.7 kg)    Height: 5\' 2"  (1.575 m)      Assessment & Plan:

## 2016-11-03 NOTE — Assessment & Plan Note (Signed)
Checking RUQ [place] to rule out gallbladder disease. Change omeprazole to protonix. Continue to avoid triggers.

## 2016-11-07 ENCOUNTER — Encounter: Payer: Self-pay | Admitting: Internal Medicine

## 2016-11-08 ENCOUNTER — Emergency Department
Admission: EM | Admit: 2016-11-08 | Discharge: 2016-11-08 | Disposition: A | Discharge status: Home / Self Care | Payer: 59 | Source: Home / Self Care | Attending: Emergency Medicine | Admitting: Emergency Medicine

## 2016-11-08 ENCOUNTER — Telehealth: Payer: Self-pay | Admitting: *Deleted

## 2016-11-08 ENCOUNTER — Encounter: Payer: Self-pay | Admitting: *Deleted

## 2016-11-08 DIAGNOSIS — J029 Acute pharyngitis, unspecified: Secondary | ICD-10-CM | POA: Diagnosis not present

## 2016-11-08 DIAGNOSIS — R899 Unspecified abnormal finding in specimens from other organs, systems and tissues: Secondary | ICD-10-CM

## 2016-11-08 DIAGNOSIS — R682 Dry mouth, unspecified: Secondary | ICD-10-CM

## 2016-11-08 DIAGNOSIS — G35 Multiple sclerosis: Secondary | ICD-10-CM

## 2016-11-08 LAB — POCT INFLUENZA A/B
Influenza A, POC: NEGATIVE
Influenza B, POC: NEGATIVE

## 2016-11-08 LAB — POCT RAPID STREP A (OFFICE): Rapid Strep A Screen: NEGATIVE

## 2016-11-08 MED ORDER — FIRST-DUKES MOUTHWASH MT SUSP: OROMUCOSAL | 1 refills | Status: DC

## 2016-11-08 NOTE — Discharge Instructions (Signed)
B sure and drink fluids. Take Tylenol as needed for fever. You have a mouthwash at the pharmacy to use for your throat pain.

## 2016-11-08 NOTE — Telephone Encounter (Signed)
-----   Message from Britt Bottom, MD sent at 11/07/2016  6:11 PM EDT ----- Please let her know that the lab tests was unchanged. There are a lot of false positives with this test. If she does develop a chronic dry eyes or very dry mouth. We might want her to see rheumatology without the symptoms the results are most likely are both positive.

## 2016-11-08 NOTE — ED Provider Notes (Signed)
[name]    CSN: [id number] Arrival date & time: 11/08/16  4128     History   Chief Complaint Chief Complaint  Patient presents with  . Sore Throat    HPI Michelle Waller is a 26 y.o. female.  Patient was in her usual state of health until 24 hours ago when she started to feel a pressure sensation. She had a discomfort in the top of her head associated with this pressure sensation yesterday evening she developed a severe sore throat. She also developed aching in her muscles associated with this. She has a tingling sensation in her skin. She has no cough. HPI  Past Medical History:  Diagnosis Date  . Eating disorder   . Frequent headaches   . GERD (gastroesophageal reflux disease)   . Kidney stones   . Kidney stones   . Ovarian cyst   . UTI (urinary tract infection)   . Vision abnormalities     Patient Active Problem List   Diagnosis Date Noted  . Epigastric pain 11/03/2016  . ANA positive 10/31/2016  . Vision disturbance 08/15/2016  . Other headache syndrome 08/15/2016  . Anxiety 08/15/2016  . Pre-syncope 05/31/2016  . Hypothyroidism 02/03/2016  . Panic attack 02/11/2015  . GERD (gastroesophageal reflux disease) 02/11/2015  . Acne 02/11/2015    Past Surgical History:  Procedure Laterality Date  . ADENOIDECTOMY    . COLONOSCOPY    . CYSTOSCOPY    . KNEE SURGERY      OB History    Gravida Para Term Preterm AB Living   0 0 0 0 0 0   SAB TAB Ectopic Multiple Live Births   0 0 0 0         Home Medications    Prior to Admission medications   Medication Sig Start Date End Date Taking? Authorizing Provider  Diphenhyd-Hydrocort-Nystatin (FIRST-DUKES MOUTHWASH) SUSP 1 teaspoon as rinse gargle and spit 4 times a day.please prepare the Dukes mouthwash without methylprednisolone. 11/08/16   Darlyne Russian, MD  EPIPEN 2-PAK 0.3 MG/0.3ML SOAJ injection USE SUBCUTANEOUSLY AS DIRECTED 05/28/14   [provider]  FLUoxetine (PROZAC) 40 MG  capsule Take 1 capsule (40 mg total) by mouth daily. 08/15/16   Sater, Nanine Means, MD  LORazepam (ATIVAN) 0.5 MG tablet TAKE 1/2-1 TABLET BY MOUTH EVERY 8 HOURS AS NEEDED FOR ANXIETY 04/05/16   Hoyt Koch, MD  omeprazole (PRILOSEC) 20 MG capsule TAKE 1 CAPSULE BY MOUTH DAILY. 04/05/16   Hoyt Koch, MD  pantoprazole (PROTONIX) 40 MG tablet Take 1 tablet (40 mg total) by mouth daily. 11/02/16   Hoyt Koch, MD  SYNTHROID 50 MCG tablet Take 1 tablet (50 mcg total) by mouth daily before breakfast. 08/21/16   Golden Circle, FNP    Family History Family History  Problem Relation Age of Onset  . Depression Mother   . Atrial fibrillation Mother   . Hypertension Father   . Arthritis Maternal Grandmother   . Heart disease Maternal Grandmother   . Cancer Paternal Grandmother        breast  . Hypertension Paternal Grandmother   . Heart disease Paternal Grandfather   . Hypertension Paternal Grandfather   . Diabetes Paternal Grandfather   . Diabetes Mellitus II Sister   . Hypothyroidism Sister     Social History Social History  Substance Use Topics  . Smoking status: Former Smoker    Quit date: 11/09/2011  . Smokeless tobacco: Never Used  .  Alcohol use 0.0 oz/week     Comment: occasionally      Allergies   Soy allergy and Medrol [methylprednisolone]   Review of Systems Review of Systems  Constitutional: Positive for fatigue and fever. Negative for chills and diaphoresis.  HENT: Positive for congestion and sore throat.   Eyes: Negative.   Respiratory: Negative for cough and shortness of breath.   Gastrointestinal: Negative.      Physical Exam Triage Vital Signs ED Triage Vitals [11/08/16 1021]  Enc Vitals Group     BP 109/73     Pulse Rate 96     Resp 16     Temp 98.8 F (37.1 C)     Temp Source Oral     SpO2 100 %     Weight 147 lb (66.7 kg)     Height 5\' 2"  (1.575 m)     Head Circumference      Peak Flow      Pain Score 0     Pain  Loc      Pain Edu?      Excl. in [place]?    No data found.   Updated Vital Signs BP 109/73 (BP Location: Left Arm)   Pulse 96   Temp 98.8 F (37.1 C) (Oral)   Resp 16   Ht 5\' 2"  (1.575 m)   Wt 147 lb (66.7 kg)   LMP 11/08/2016   SpO2 100%   BMI 26.89 kg/m   Visual Acuity Right Eye Distance:   Left Eye Distance:   Bilateral Distance:    Right Eye Near:   Left Eye Near:    Bilateral Near:     Physical Exam  Constitutional: She appears well-developed.  HENT:  Right Ear: External ear normal.  Left Ear: External ear normal.  There is redness of the posterior pharynx. There is no tonsillar swelling. There is no cervical adenopathy.  Neck: Normal range of motion.  Cardiovascular: Normal rate.   Pulmonary/Chest: Effort normal and breath sounds normal.  Skin: Skin is warm and dry.  Psychiatric: She has a normal mood and affect.     UC Treatments / Results  Labs (all labs ordered are listed, but only abnormal results are displayed) Labs Reviewed  POCT RAPID STREP A (OFFICE)  POCT INFLUENZA A/B    EKG  EKG Interpretation None       Radiology No results found.  Procedures Procedures (including critical care time)  Medications Ordered in UC Medications - No data to display   Initial Impression / Assessment and Plan / UC Course  I have reviewed the triage vital signs and the nursing notes.  Pertinent labs & imaging results that were available during my care of the patient were reviewed by me and considered in my medical decision making (see chart for details).   Patient has a significant sore throat. Strep test was negative. Flu test was negative. Strep culture was sent. Patient will treat her fever and a prescription for dux mouthwash was called into the pharmacy. She was given a note until Monday.    Final Clinical Impressions(s) / UC Diagnoses   Final diagnoses:  Pharyngitis, unspecified etiology    New Prescriptions New Prescriptions    DIPHENHYD-HYDROCORT-NYSTATIN (FIRST-DUKES MOUTHWASH) SUSP    1 teaspoon as rinse gargle and spit 4 times a day.please prepare the Dukes mouthwash without methylprednisolone.     Controlled Substance Prescriptions  Controlled Substance Registry consulted? Not Applicable   Darlyne Russian, MD 11/08/16 1058

## 2016-11-08 NOTE — ED Triage Notes (Signed)
Pt c/o muscle aches, sore throat, chills and head pressure x 1 day. She has taken Mucinex Flu and cold with some relief.

## 2016-11-08 NOTE — Telephone Encounter (Signed)
Spoke with Michelle Waller and reviewed below results with her, and advised RAS will order a rheum. referral if she would like to investigate the possibility of Sjogren's Syndrome further, or if she develops dry mouth/dry eyes.   She verbalized understanding of same.

## 2016-11-10 ENCOUNTER — Encounter: Payer: Self-pay | Admitting: Neurology

## 2016-11-11 ENCOUNTER — Emergency Department
Admission: EM | Admit: 2016-11-11 | Discharge: 2016-11-11 | Disposition: A | Discharge status: Home / Self Care | Payer: 59 | Source: Home / Self Care | Attending: Family Medicine | Admitting: Family Medicine

## 2016-11-11 ENCOUNTER — Encounter: Payer: Self-pay | Admitting: Emergency Medicine

## 2016-11-11 DIAGNOSIS — Z8619 Personal history of other infectious and parasitic diseases: Secondary | ICD-10-CM

## 2016-11-11 DIAGNOSIS — J040 Acute laryngitis: Secondary | ICD-10-CM | POA: Diagnosis not present

## 2016-11-11 MED ORDER — PREDNISONE 20 MG PO TABS: ORAL_TABLET | ORAL | 0 refills | Status: DC

## 2016-11-11 NOTE — ED Provider Notes (Signed)
[name]    CSN: [id number] Arrival date & time: 11/11/16  3086     History   Chief Complaint Chief Complaint  Patient presents with  . Sore Throat    HPI Shanaia Sievers is a 26 y.o. female.   HPI  Phillippa Straub is a 26 y.o. female presenting to UC with c/o continued worsening sore throat with hoarse voice and dots on the back of her throat, associated fatigue and intermittent headache.  She was seen for similar symptoms on 11/08/16, had a Negative strep and Negative flu.  She was prescribed [facility]'s mouthwash but has only tried it once because she does not like how it makes her feel.  She has been taking Mucinex cold, which has acetaminophen in it, but she has not tried ibuprofen.  Denies cough or congestion.  Denies fever, chills, n/v/d. Hx of mononucleosis in the past, symptoms feel similar.  Denies abdominal pain.     Past Medical History:  Diagnosis Date  . Eating disorder   . Frequent headaches   . GERD (gastroesophageal reflux disease)   . Kidney stones   . Kidney stones   . Ovarian cyst   . UTI (urinary tract infection)   . Vision abnormalities     Patient Active Problem List   Diagnosis Date Noted  . Epigastric pain 11/03/2016  . ANA positive 10/31/2016  . Vision disturbance 08/15/2016  . Other headache syndrome 08/15/2016  . Anxiety 08/15/2016  . Pre-syncope 05/31/2016  . Hypothyroidism 02/03/2016  . Panic attack 02/11/2015  . GERD (gastroesophageal reflux disease) 02/11/2015  . Acne 02/11/2015    Past Surgical History:  Procedure Laterality Date  . ADENOIDECTOMY    . COLONOSCOPY    . CYSTOSCOPY    . KNEE SURGERY      OB History    Gravida Para Term Preterm AB Living   0 0 0 0 0 0   SAB TAB Ectopic Multiple Live Births   0 0 0 0         Home Medications    Prior to Admission medications   Medication Sig Start Date End Date Taking? Authorizing Provider  Diphenhyd-Hydrocort-Nystatin (FIRST-DUKES MOUTHWASH) SUSP 1 teaspoon as  rinse gargle and spit 4 times a day.please prepare the Dukes mouthwash without methylprednisolone. 11/08/16   Darlyne Russian, MD  EPIPEN 2-PAK 0.3 MG/0.3ML SOAJ injection USE SUBCUTANEOUSLY AS DIRECTED 05/28/14   [provider]  FLUoxetine (PROZAC) 40 MG capsule Take 1 capsule (40 mg total) by mouth daily. 08/15/16   Sater, Nanine Means, MD  LORazepam (ATIVAN) 0.5 MG tablet TAKE 1/2-1 TABLET BY MOUTH EVERY 8 HOURS AS NEEDED FOR ANXIETY 04/05/16   Hoyt Koch, MD  omeprazole (PRILOSEC) 20 MG capsule TAKE 1 CAPSULE BY MOUTH DAILY. 04/05/16   Hoyt Koch, MD  pantoprazole (PROTONIX) 40 MG tablet Take 1 tablet (40 mg total) by mouth daily. 11/02/16   Hoyt Koch, MD  SYNTHROID 50 MCG tablet Take 1 tablet (50 mcg total) by mouth daily before breakfast. 08/21/16   Golden Circle, FNP    Family History Family History  Problem Relation Age of Onset  . Depression Mother   . Atrial fibrillation Mother   . Hypertension Father   . Arthritis Maternal Grandmother   . Heart disease Maternal Grandmother   . Cancer Paternal Grandmother        breast  . Hypertension Paternal Grandmother   . Heart disease Paternal Grandfather   . Hypertension Paternal  Grandfather   . Diabetes Paternal Grandfather   . Diabetes Mellitus II Sister   . Hypothyroidism Sister     Social History Social History  Substance Use Topics  . Smoking status: Former Smoker    Quit date: 11/09/2011  . Smokeless tobacco: Never Used  . Alcohol use 0.0 oz/week     Comment: occasionally      Allergies   Soy allergy and Medrol [methylprednisolone]   Review of Systems Review of Systems  Constitutional: Positive for fatigue. Negative for chills and fever.  HENT: Positive for sore throat and voice change. Negative for congestion, ear pain and trouble swallowing.   Respiratory: Negative for cough and shortness of breath.   Cardiovascular: Negative for chest pain and palpitations.    Gastrointestinal: Negative for abdominal pain, diarrhea, nausea and vomiting.  Musculoskeletal: Negative for arthralgias, back pain and myalgias.  Skin: Negative for rash.  Neurological: Positive for weakness and headaches. Negative for dizziness, syncope and light-headedness.     Physical Exam Triage Vital Signs ED Triage Vitals  Enc Vitals Group     BP 11/11/16 0957 (!) 141/88     Pulse Rate 11/11/16 0957 78     Resp --      Temp 11/11/16 0957 98.6 F (37 C)     Temp Source 11/11/16 0957 Oral     SpO2 11/11/16 0957 100 %     Weight 11/11/16 0957 145 lb 8 oz (66 kg)     Height 11/11/16 0957 5\' 2"  (1.575 m)     Head Circumference --      Peak Flow --      Pain Score 11/11/16 0958 3     Pain Loc --      Pain Edu? --      Excl. in [place]? --    No data found.   Updated Vital Signs BP (!) 141/88 (BP Location: Left Arm)   Pulse 78   Temp 98.6 F (37 C) (Oral)   Ht 5\' 2"  (1.575 m)   Wt 145 lb 8 oz (66 kg)   LMP 11/08/2016   SpO2 100%   BMI 26.61 kg/m   Visual Acuity Right Eye Distance:   Left Eye Distance:   Bilateral Distance:    Right Eye Near:   Left Eye Near:    Bilateral Near:     Physical Exam  Constitutional: She is oriented to person, place, and time. She appears well-developed and well-nourished. No distress.  HENT:  Head: Normocephalic and atraumatic.  Right Ear: Tympanic membrane normal.  Left Ear: Tympanic membrane normal.  Nose: Nose normal.  Mouth/Throat: Uvula is midline and mucous membranes are normal. Posterior oropharyngeal erythema present. No oropharyngeal exudate, posterior oropharyngeal edema or tonsillar abscesses.  Eyes: EOM are normal.  Neck: Normal range of motion. Neck supple.  Hoarse voice w/o stridor.   Cardiovascular: Normal rate and regular rhythm.   Pulmonary/Chest: Effort normal and breath sounds normal. No stridor. No respiratory distress. She has no wheezes. She has no rales.  Abdominal: Soft. She exhibits no distension and  no mass. There is no tenderness. There is no guarding.  Musculoskeletal: Normal range of motion.  Lymphadenopathy:    She has no cervical adenopathy.  Neurological: She is alert and oriented to person, place, and time.  Skin: Skin is warm and dry. No rash noted. She is not diaphoretic. No erythema.  Psychiatric: She has a normal mood and affect. Her behavior is normal.  Nursing note and vitals reviewed.  UC Treatments / Results  Labs (all labs ordered are listed, but only abnormal results are displayed) Labs Reviewed - No data to display  EKG  EKG Interpretation None       Radiology No results found.  Procedures Procedures (including critical care time)  Medications Ordered in UC Medications - No data to display   Initial Impression / Assessment and Plan / UC Course  I have reviewed the triage vital signs and the nursing notes.  Pertinent labs & imaging results that were available during my care of the patient were reviewed by me and considered in my medical decision making (see chart for details).     Hx and exam c/w laryngitis and likely recurrence of mono.   Discussed treatment with oral prednisone. Pt initially stated she had had plain prednisone in the past and had done well, but then declined prescription for prednisone due to concern for facial swelling with Medrol.   Encouraged fluids, rest, acetaminophen, ibuprofen, saltwater gargles and OTC throat lozenges such as Cepacol to help with throat discomfort.  F/u with PCP in 1 week if not improving.   Final Clinical Impressions(s) / UC Diagnoses   Final diagnoses:  Laryngitis  History of mononucleosis    New Prescriptions Discharge Medication List as of 11/11/2016 10:08 AM    START taking these medications   Details            Controlled Substance Prescriptions [place] Controlled Substance Registry consulted? Not Applicable   Noe Gens, PA-C 11/11/16 1018

## 2016-11-11 NOTE — Discharge Instructions (Signed)
°  You may take 500mg acetaminophen every 4-6 hours or in combination with ibuprofen 400-600mg every 6-8 hours as needed for pain, inflammation, and fever. ° °Be sure to drink at least eight 8oz glasses of water to stay well hydrated and get at least 8 hours of sleep at night, preferably more while sick.  ° °

## 2016-11-11 NOTE — ED Triage Notes (Signed)
Patient complaining of dots on throat, seen on Wednesday, strep and flu negative, feeling fatigue, no voice.

## 2016-11-13 ENCOUNTER — Telehealth: Payer: Self-pay | Admitting: Internal Medicine

## 2016-11-13 NOTE — Addendum Note (Signed)
Addended by: France Ravens I on: 11/13/2016 08:26 AM   Modules accepted: Orders

## 2016-11-13 NOTE — Telephone Encounter (Signed)
Since she has had mono before we cannot tell the difference between current and old infection.

## 2016-11-13 NOTE — Telephone Encounter (Signed)
Rheumatology referral made per pt's emailed request/fim

## 2016-11-13 NOTE — Telephone Encounter (Signed)
Pt went to er this weekend and they said that she may have a mono.  She wanted to know if an order could be put in for a blood test for mono and she could just come to the lab?

## 2016-11-14 ENCOUNTER — Ambulatory Visit (INDEPENDENT_AMBULATORY_CARE_PROVIDER_SITE_OTHER): Payer: 59 | Admitting: Internal Medicine

## 2016-11-14 ENCOUNTER — Encounter: Payer: Self-pay | Admitting: Internal Medicine

## 2016-11-14 DIAGNOSIS — B349 Viral infection, unspecified: Secondary | ICD-10-CM

## 2016-11-14 NOTE — Progress Notes (Signed)
   Subjective:    Patient ID: Michelle Waller, female    DOB: April 10, 1990, 26 y.o.   MRN: [id number]  HPI The patient is a 26 YO female coming in for cold symptoms. Started about 1 week ago with some fevers and chills. She also had some nose dripping and sore throat. She is taking robitussin and with this is avoiding cough. She does have some cough without it. Some body aches as well. Denies SOB with exertion. Some tiredness. Was out of work several days due to the illness. Went to urgent care and tested for flu and strep both of which were negative. She is gradually improving.   Review of Systems  Constitutional: Positive for activity change, appetite change and chills. Negative for fatigue, fever and unexpected weight change.  HENT: Positive for congestion, postnasal drip, rhinorrhea and sore throat. Negative for dental problem, drooling, ear discharge, ear pain, sinus pain, sinus pressure, trouble swallowing and voice change.   Eyes: Negative.   Respiratory: Positive for cough. Negative for chest tightness and shortness of breath.   Cardiovascular: Negative for chest pain, palpitations and leg swelling.  Gastrointestinal: Negative for abdominal distention, abdominal pain, constipation, diarrhea, nausea and vomiting.  Skin: Negative.   Neurological: Negative.       Objective:   Physical Exam  Constitutional: She is oriented to person, place, and time. She appears well-developed and well-nourished.  HENT:  Head: Normocephalic and atraumatic.  Oropharynx with mild redness and clear drainage  Eyes: EOM are normal.  Neck: Normal range of motion.  Cardiovascular: Normal rate and regular rhythm.   Pulmonary/Chest: Effort normal and breath sounds normal. No respiratory distress. She has no wheezes. She has no rales.  Abdominal: Soft.  Musculoskeletal: She exhibits no edema.  Neurological: She is alert and oriented to person, place, and time.  Skin: Skin is warm and dry.   Vitals:   11/14/16  1432  BP: 120/82  Pulse: 84  Temp: 98.7 F (37.1 C)  TempSrc: Oral  SpO2: 100%  Weight: 143 lb (64.9 kg)  Height: 5\' 2"  (1.575 m)      Assessment & Plan:

## 2016-11-14 NOTE — Telephone Encounter (Signed)
Patient called and informed. Patient is scheduled for a visit at 2:30 today

## 2016-11-15 DIAGNOSIS — B349 Viral infection, unspecified: Secondary | ICD-10-CM | POA: Insufficient documentation

## 2016-11-15 NOTE — Assessment & Plan Note (Signed)
Continue conservative measures. Given work note to excuse absence. Discussed infectious until no fever 24 hours without tylenol. She is gradually improving. No indication for steroids or antibiotics.

## 2016-11-22 DIAGNOSIS — R87612 Low grade squamous intraepithelial lesion on cytologic smear of cervix (LGSIL): Secondary | ICD-10-CM | POA: Diagnosis not present

## 2016-11-22 DIAGNOSIS — N841 Polyp of cervix uteri: Secondary | ICD-10-CM | POA: Diagnosis not present

## 2016-11-22 DIAGNOSIS — N870 Mild cervical dysplasia: Secondary | ICD-10-CM | POA: Diagnosis not present

## 2016-11-22 DIAGNOSIS — N72 Inflammatory disease of cervix uteri: Secondary | ICD-10-CM | POA: Diagnosis not present

## 2016-11-22 DIAGNOSIS — J219 Acute bronchiolitis, unspecified: Secondary | ICD-10-CM | POA: Diagnosis not present

## 2016-11-27 ENCOUNTER — Other Ambulatory Visit: Payer: 59

## 2016-12-01 DIAGNOSIS — R87612 Low grade squamous intraepithelial lesion on cytologic smear of cervix (LGSIL): Secondary | ICD-10-CM | POA: Diagnosis not present

## 2017-01-04 ENCOUNTER — Other Ambulatory Visit: Payer: Self-pay

## 2017-01-04 MED ORDER — SYNTHROID 50 MCG PO TABS: 50.0000 ug | ORAL_TABLET | Freq: Every day | ORAL | 2 refills | Status: DC

## 2017-01-05 ENCOUNTER — Telehealth: Payer: Self-pay | Admitting: Internal Medicine

## 2017-01-05 ENCOUNTER — Telehealth: Payer: Self-pay

## 2017-01-05 NOTE — Telephone Encounter (Signed)
Pharmacy informed.

## 2017-01-05 NOTE — Telephone Encounter (Unsigned)
Copied from [place]. Topic: Quick Communication - See Telephone Encounter >> Jan 05, 2017  2:04 PM Boyd Kerbs wrote: CRM for notification. See Telephone encounter for:  Patient calling about Levothyroxine Sodium 50 mcg Oral Daily before breakfast,  Pharmacy saying they did not get this.  Please resend Michelle Waller, Michelle Waller [place] [address] Phone: [phone] Fax: [phone]   01/05/17.

## 2017-01-05 NOTE — Telephone Encounter (Signed)
Yes, this is fine.

## 2017-01-05 NOTE — Telephone Encounter (Signed)
Received a fax from pharmacy asking if it is okay for patient to take generic levothyroxine. Rx was sent as brand only. Please advise. thanks

## 2017-01-10 DIAGNOSIS — E663 Overweight: Secondary | ICD-10-CM | POA: Diagnosis not present

## 2017-01-10 DIAGNOSIS — R768 Other specified abnormal immunological findings in serum: Secondary | ICD-10-CM | POA: Diagnosis not present

## 2017-01-10 DIAGNOSIS — M3500 Sicca syndrome, unspecified: Secondary | ICD-10-CM | POA: Diagnosis not present

## 2017-01-10 DIAGNOSIS — Z6826 Body mass index (BMI) 26.0-26.9, adult: Secondary | ICD-10-CM | POA: Diagnosis not present

## 2017-01-17 ENCOUNTER — Emergency Department (HOSPITAL_COMMUNITY)
Admission: EM | Admit: 2017-01-17 | Discharge: 2017-01-18 | Disposition: A | Discharge status: Home / Self Care | Payer: 59 | Attending: Emergency Medicine | Admitting: Emergency Medicine

## 2017-01-17 ENCOUNTER — Encounter: Payer: Self-pay | Admitting: Gastroenterology

## 2017-01-17 ENCOUNTER — Emergency Department (HOSPITAL_COMMUNITY): Payer: 59

## 2017-01-17 ENCOUNTER — Encounter (HOSPITAL_COMMUNITY): Payer: Self-pay | Admitting: Emergency Medicine

## 2017-01-17 DIAGNOSIS — Z79899 Other long term (current) drug therapy: Secondary | ICD-10-CM | POA: Diagnosis not present

## 2017-01-17 DIAGNOSIS — R0789 Other chest pain: Secondary | ICD-10-CM | POA: Diagnosis not present

## 2017-01-17 DIAGNOSIS — Z87891 Personal history of nicotine dependence: Secondary | ICD-10-CM | POA: Insufficient documentation

## 2017-01-17 DIAGNOSIS — R0602 Shortness of breath: Secondary | ICD-10-CM | POA: Insufficient documentation

## 2017-01-17 DIAGNOSIS — R110 Nausea: Secondary | ICD-10-CM | POA: Insufficient documentation

## 2017-01-17 DIAGNOSIS — R079 Chest pain, unspecified: Secondary | ICD-10-CM | POA: Diagnosis not present

## 2017-01-17 DIAGNOSIS — E039 Hypothyroidism, unspecified: Secondary | ICD-10-CM | POA: Insufficient documentation

## 2017-01-17 LAB — URINALYSIS, ROUTINE W REFLEX MICROSCOPIC
Bilirubin Urine: NEGATIVE
Glucose, UA: NEGATIVE mg/dL
Ketones, ur: 80 mg/dL — AB
Leukocytes, UA: NEGATIVE
Nitrite: NEGATIVE
Protein, ur: NEGATIVE mg/dL
Specific Gravity, Urine: 1.019 (ref 1.005–1.030)
pH: 6 (ref 5.0–8.0)

## 2017-01-17 LAB — BASIC METABOLIC PANEL
Anion gap: 9 (ref 5–15)
BUN: 12 mg/dL (ref 6–20)
CO2: 23 mmol/L (ref 22–32)
Calcium: 9.3 mg/dL (ref 8.9–10.3)
Chloride: 103 mmol/L (ref 101–111)
Creatinine, Ser: 0.63 mg/dL (ref 0.44–1.00)
GFR calc Af Amer: >60 mL/min (ref >60)
GFR calc non Af Amer: >60 mL/min (ref >60)
Glucose, Bld: 91 mg/dL (ref 65–99)
Potassium: 3.9 mmol/L (ref 3.5–5.1)
Sodium: 135 mmol/L (ref 135–145)

## 2017-01-17 LAB — I-STAT BETA HCG BLOOD, ED (MC, WL, AP ONLY): I-stat hCG, quantitative: <5 m[IU]/mL (ref <5)

## 2017-01-17 LAB — CBC
HCT: 41.1 % (ref 36.0–46.0)
Hemoglobin: 14.5 g/dL (ref 12.0–15.0)
MCH: 31.6 pg (ref 26.0–34.0)
MCHC: 35.3 g/dL (ref 30.0–36.0)
MCV: 89.5 fL (ref 78.0–100.0)
Platelets: 91 10*3/uL — ABNORMAL LOW (ref 150–400)
RBC: 4.59 MIL/uL (ref 3.87–5.11)
RDW: 11.8 % (ref 11.5–15.5)
WBC: 5.5 10*3/uL (ref 4.0–10.5)

## 2017-01-17 LAB — I-STAT TROPONIN, ED
Troponin i, poc: 0 ng/mL (ref 0.00–0.08)
Troponin i, poc: 0 ng/mL (ref 0.00–0.08)

## 2017-01-17 MED ORDER — GI COCKTAIL ~~LOC~~
30.0000 mL | Freq: Once | ORAL | Status: AC
  Administered 2017-01-17: 30 mL via ORAL
  Filled 2017-01-17: qty 30

## 2017-01-17 NOTE — ED Notes (Signed)
PA at bedside. Will return to update patients vitals once PA leaves bedside.

## 2017-01-17 NOTE — ED Notes (Signed)
RN obtaining lab work.

## 2017-01-17 NOTE — ED Provider Notes (Signed)
[facility] Provider Note   CSN: [id number] Arrival date & time: 01/17/17  1731     History   Chief Complaint Chief Complaint  Patient presents with  . Chest Pain    HPI Michelle Waller is a 26 y.o. female with a hx of headaches, kidney stones, GERD, UTI presents to the Emergency Department complaining of gradual, persistent, progressively worsening cental chest pain onset 9am.  Pt describes the pain as tightness/squeezing in the center of her chest.  She rates her pain at a 1/10 without radiation. Pt reports the more movement she preformed the worse the pain became.  Associated symptoms include SOB and nausea by the end of her shift.  Pain was unrelieved by antacids. Nothing makes the pain worse.  Pt reports a similar episode in Sept and she was dx with GERD.  Pt denies fever, chills, headache, neck pain, neck stiffness, abd pain, vomiting, diarrhea, weakness, dizziness, syncope.  Pt denies long travel, leg swelling, estrogen usage, hx of cancer or lupus.  Pt reports a hx a DVT in the left leg (2007) after an injury and treated with a blood thinner.  Patient reports that she has an appointment for gastroenterology scheduled for early January.  Pt vitals were taken at work with the following BP:   147/101 150/102  The history is provided by the patient and medical records. No language interpreter was used.    Past Medical History:  Diagnosis Date  . Eating disorder   . Frequent headaches   . GERD (gastroesophageal reflux disease)   . Kidney stones   . Kidney stones   . Ovarian cyst   . UTI (urinary tract infection)   . Vision abnormalities     Patient Active Problem List   Diagnosis Date Noted  . Viral illness 11/15/2016  . Epigastric pain 11/03/2016  . ANA positive 10/31/2016  . Vision disturbance 08/15/2016  . Other headache syndrome 08/15/2016  . Anxiety 08/15/2016  . Pre-syncope 05/31/2016  . Hypothyroidism 02/03/2016  . Panic  attack 02/11/2015  . GERD (gastroesophageal reflux disease) 02/11/2015  . Acne 02/11/2015    Past Surgical History:  Procedure Laterality Date  . ADENOIDECTOMY    . COLONOSCOPY    . CYSTOSCOPY    . KNEE SURGERY      OB History    Gravida Para Term Preterm AB Living   0 0 0 0 0 0   SAB TAB Ectopic Multiple Live Births   0 0 0 0         Home Medications    Prior to Admission medications   Medication Sig Start Date End Date Taking? Authorizing Provider  FLUoxetine (PROZAC) 40 MG capsule Take 1 capsule (40 mg total) by mouth daily. 08/15/16  Yes Sater, Nanine Means, MD  LORazepam (ATIVAN) 0.5 MG tablet TAKE 1/2-1 TABLET BY MOUTH EVERY 8 HOURS AS NEEDED FOR ANXIETY 04/05/16  Yes Hoyt Koch, MD  pantoprazole (PROTONIX) 40 MG tablet Take 1 tablet (40 mg total) by mouth daily. 11/02/16  Yes Hoyt Koch, MD  SYNTHROID 50 MCG tablet Take 1 tablet (50 mcg total) by mouth daily before breakfast. 01/04/17  Yes Hoyt Koch, MD  Diphenhyd-Hydrocort-Nystatin (FIRST-DUKES MOUTHWASH) SUSP 1 teaspoon as rinse gargle and spit 4 times a day.please prepare the Dukes mouthwash without methylprednisolone. Patient not taking: Reported on 11/14/2016 11/08/16   Darlyne Russian, MD  EPIPEN 2-PAK 0.3 MG/0.3ML SOAJ injection USE SUBCUTANEOUSLY AS DIRECTED 05/28/14   [provider]  hydrOXYzine (ATARAX/VISTARIL) 10 MG tablet Take 1 tablet by mouth as needed for bladder spasms    [provider]  omeprazole (PRILOSEC) 20 MG capsule TAKE 1 CAPSULE BY MOUTH DAILY. Patient not taking: Reported on 01/17/2017 04/05/16   Hoyt Koch, MD  sucralfate (CARAFATE) 1 g tablet Take 1 tablet (1 g total) by mouth 4 (four) times daily -  with meals and at bedtime. 01/18/17   Shenique Childers, Jarrett Soho, PA-C    Family History Family History  Problem Relation Age of Onset  . Depression Mother   . Atrial fibrillation Mother   . Hypertension Father   . Arthritis Maternal  Grandmother   . Heart disease Maternal Grandmother   . Cancer Paternal Grandmother        breast  . Hypertension Paternal Grandmother   . Heart disease Paternal Grandfather   . Hypertension Paternal Grandfather   . Diabetes Paternal Grandfather   . Diabetes Mellitus II Sister   . Hypothyroidism Sister     Social History Social History   Tobacco Use  . Smoking status: Former Smoker    Last attempt to quit: 11/09/2011    Years since quitting: 5.1  . Smokeless tobacco: Never Used  Substance Use Topics  . Alcohol use: Yes    Alcohol/week: 0.0 oz    Comment: occasionally   . Drug use: No     Allergies   Soy allergy and Medrol [methylprednisolone]   Review of Systems Review of Systems  Constitutional: Negative for appetite change, diaphoresis, fatigue, fever and unexpected weight change.  HENT: Negative for mouth sores.   Eyes: Negative for visual disturbance.  Respiratory: Positive for shortness of breath. Negative for cough, chest tightness and wheezing.   Cardiovascular: Positive for chest pain.  Gastrointestinal: Positive for nausea. Negative for abdominal pain, constipation, diarrhea and vomiting.  Endocrine: Negative for polydipsia, polyphagia and polyuria.  Genitourinary: Negative for dysuria, frequency, hematuria and urgency.  Musculoskeletal: Negative for back pain and neck stiffness.  Skin: Negative for rash.  Allergic/Immunologic: Negative for immunocompromised state.  Neurological: Negative for syncope, light-headedness and headaches.  Hematological: Does not bruise/bleed easily.  Psychiatric/Behavioral: Negative for sleep disturbance. The patient is not nervous/anxious.      Physical Exam Updated Vital Signs BP (!) 135/97 (BP Location: Right Arm)   Pulse 82   Temp 98.2 F (36.8 C) (Oral)   Resp 18   LMP 01/06/2017   SpO2 100%   Physical Exam  Constitutional: She appears well-developed and well-nourished. No distress.  Awake, alert, nontoxic  appearance  HENT:  Head: Normocephalic and atraumatic.  Mouth/Throat: Oropharynx is clear and moist. No oropharyngeal exudate.  Eyes: Conjunctivae are normal. No scleral icterus.  Neck: Normal range of motion. Neck supple.  Cardiovascular: Normal rate, regular rhythm and intact distal pulses.  Pulmonary/Chest: Effort normal and breath sounds normal. No respiratory distress. She has no wheezes.  Equal chest expansion  Abdominal: Soft. Bowel sounds are normal. She exhibits no mass. There is no tenderness. There is no rebound and no guarding.  Musculoskeletal: Normal range of motion. She exhibits no edema.       Right lower leg: She exhibits no edema.  No calf tenderness  Neurological: She is alert.  Speech is clear and goal oriented Moves extremities without ataxia  Skin: Skin is warm and dry. She is not diaphoretic.  Psychiatric: She has a normal mood and affect.  Nursing note and vitals reviewed.    ED Treatments / Results  Labs (all labs ordered are listed, but only abnormal results are displayed) Labs Reviewed  CBC - Abnormal; Notable for the following components:      Result Value   Platelets 91 (*)    All other components within normal limits  URINALYSIS, ROUTINE W REFLEX MICROSCOPIC - Abnormal; Notable for the following components:   Hgb urine dipstick SMALL (*)    Ketones, ur 80 (*)    Bacteria, UA RARE (*)    Squamous Epithelial / LPF 0-5 (*)    All other components within normal limits  BASIC METABOLIC PANEL  D-DIMER, QUANTITATIVE (NOT AT [facility])  I-STAT TROPONIN, ED  I-STAT BETA HCG BLOOD, ED ([facility], [facility], [facility] ONLY)  I-STAT TROPONIN, ED    EKG  EKG Interpretation  Date/Time:  Wednesday January 17 2017 17:56:22 EST Ventricular Rate:  81 PR Interval:    QRS Duration: 87 QT Interval:  368 QTC Calculation: 428 R Axis:   106 Text Interpretation:  Right and left arm electrode reversal, interpretation assumes no reversal Sinus arrhythmia Short PR interval Borderline  right axis deviation Borderline Q waves in lateral leads Nonspecific T abnormalities, lateral leads Confirmed by Fredia Sorrow [phone]) on 01/17/2017 11:15:31 PM       Radiology Dg Chest 2 View  Result Date: 01/17/2017 CLINICAL DATA:  26 year old female with shortness of breath and chest pain. EXAM: CHEST  2 VIEW COMPARISON:  Chest radiograph dated 10/10/2016 FINDINGS: The heart size and mediastinal contours are within normal limits. Both lungs are clear. The visualized skeletal structures are unremarkable. IMPRESSION: No active cardiopulmonary disease. Electronically Signed   By: Anner Crete M.D.   On: 01/17/2017 18:35    Procedures Procedures (including critical care time)  Medications Ordered in ED Medications  gi cocktail (Maalox,Lidocaine,Donnatal) (30 mLs Oral Given 01/17/17 2317)     Initial Impression / Assessment and Plan / ED Course  I have reviewed the triage vital signs and the nursing notes.  Pertinent labs & imaging results that were available during my care of the patient were reviewed by me and considered in my medical decision making (see chart for details).     Patient presents emergency department with greater than 12 hours of chest pain.  She had some shortness of breath and nausea. HEART score 1. PERC neg.  Wells criteria low.  Patient is well-appearing and resting comfortably on my exam.  1:09 AM Repeat troponin is negative.  D-dimer is negative.  Patient continues to be well-appearing and is without tachycardia.  Suspect that her chest pain is related to her reflux.  Will be given Carafate for home and she is to continue her Protonix.  Discussed symptoms to return immediately to the emergency department.  Patient and significant other state understanding and are in agreement with this plan.  Final Clinical Impressions(s) / ED Diagnoses   Final diagnoses:  Central chest pain    ED Discharge Orders        Ordered    sucralfate (CARAFATE) 1 g tablet   3 times daily with meals & bedtime     01/18/17 0109       Rea Reser, Jarrett Soho, PA-C 01/18/17 0111    Fredia Sorrow, MD 01/18/17 [phone]

## 2017-01-17 NOTE — ED Triage Notes (Signed)
Patient c/o central chest pain with nausea and SOB x1 day. Hx anxiety and GERD.

## 2017-01-18 DIAGNOSIS — R110 Nausea: Secondary | ICD-10-CM | POA: Diagnosis not present

## 2017-01-18 DIAGNOSIS — Z79899 Other long term (current) drug therapy: Secondary | ICD-10-CM | POA: Diagnosis not present

## 2017-01-18 DIAGNOSIS — Z87891 Personal history of nicotine dependence: Secondary | ICD-10-CM | POA: Diagnosis not present

## 2017-01-18 DIAGNOSIS — E039 Hypothyroidism, unspecified: Secondary | ICD-10-CM | POA: Diagnosis not present

## 2017-01-18 DIAGNOSIS — R0602 Shortness of breath: Secondary | ICD-10-CM | POA: Diagnosis not present

## 2017-01-18 DIAGNOSIS — R079 Chest pain, unspecified: Secondary | ICD-10-CM | POA: Diagnosis not present

## 2017-01-18 LAB — D-DIMER, QUANTITATIVE (NOT AT ARMC): D-Dimer, Quant: <0.27 ug/mL-FEU (ref 0.00–0.50)

## 2017-01-18 MED ORDER — SUCRALFATE 1 G PO TABS: 1.0000 g | ORAL_TABLET | Freq: Three times a day (TID) | ORAL | 0 refills | Status: DC

## 2017-01-18 NOTE — ED Notes (Signed)
Lab called and stated that the d-dimer was reading abnormally high (>20) and recommended a redraw. Michelle Waller, ED PA aware and she agreed with redraw. New specimen sent to lab for retesting. Also made Domingo Dimes, RN aware.

## 2017-01-18 NOTE — Discharge Instructions (Signed)
1. Medications: Continue your Protonix, Carafate for additional symptoms, usual home medications 2. Treatment: rest, drink plenty of fluids,  3. Follow Up: Please followup with your primary doctor in 2-3 days and GI at your appointment in January for discussion of your diagnoses and further evaluation after today's visit; if you do not have a primary care doctor use the resource guide provided to find one; Please return to the ER for return or worsening of chest pain, development of shortness of breath, nausea, sweating, syncope or other concerns.

## 2017-01-31 ENCOUNTER — Ambulatory Visit: Payer: 59 | Admitting: Gastroenterology

## 2017-01-31 ENCOUNTER — Encounter: Payer: Self-pay | Admitting: Gastroenterology

## 2017-01-31 VITALS — BP 118/76 | HR 78 | Ht 62.0 in | Wt 148.8 lb

## 2017-01-31 DIAGNOSIS — R072 Precordial pain: Secondary | ICD-10-CM | POA: Diagnosis not present

## 2017-01-31 DIAGNOSIS — K219 Gastro-esophageal reflux disease without esophagitis: Secondary | ICD-10-CM

## 2017-01-31 DIAGNOSIS — R079 Chest pain, unspecified: Secondary | ICD-10-CM | POA: Insufficient documentation

## 2017-01-31 MED ORDER — PANTOPRAZOLE SODIUM 40 MG PO TBEC: 40.0000 mg | DELAYED_RELEASE_TABLET | Freq: Two times a day (BID) | ORAL | 3 refills | Status: DC

## 2017-01-31 NOTE — Progress Notes (Addendum)
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  She is here today for follow-up of ER visits at which time she was evaluated for substernal chest pain.  She tells me that she's had reflux issues since she was in college.  Says that she had bulimia so that seemed to trigger a lot of reflux.  She had an EGD back in 2014 in [place] at [facility]; we do not have those records but have requested them.  She says that they just found esophagitis.  Anyway, she is here today stating that back in September she went to the ED for central chest pain and evaluation was negative for cardiac issues.  She had been on omeprazole since her EGD in 2014, but they switched her pantoprazole 40 mg daily.  She has continued on that since that time but continues to have reflux issues.  Has woken her from sleep.  Was in the ED again on 12/26 for complaints of chest pain.  She tells me that she takes her pantoprazole right before bed.  Denies any dysphagia.  Complains of a lot of belching for the past year or so, even when she drinks water.  **Addendum:  EGD in [place] in 05/2012 showed a normal exam.    Past Medical History:  Diagnosis Date  . Eating disorder   . Frequent headaches   . GERD (gastroesophageal reflux disease)   . Kidney stones   . Kidney stones   . Ovarian cyst   . UTI (urinary tract infection)   . Vision abnormalities    Past Surgical History:  Procedure Laterality Date  . ADENOIDECTOMY    . COLONOSCOPY    . CYSTOSCOPY    . KNEE SURGERY      reports that she quit smoking about 5 years ago. she has never used smokeless tobacco. She reports that she drinks alcohol. She reports that she does not use drugs. family history includes Arthritis in her maternal grandmother; Atrial fibrillation in her mother; Cancer in her paternal grandmother; Depression in her mother; Diabetes in her paternal  grandfather; Diabetes Mellitus II in her sister; Heart disease in her maternal grandmother and paternal grandfather; Hypertension in her father, paternal grandfather, and paternal grandmother; Hypothyroidism in her sister. Allergies  Allergen Reactions  . Soy Allergy Anaphylaxis    Large amounts cause anaphylaxis Small amounts cause upset stomach  . Medrol [Methylprednisolone]     Facial swelling       Outpatient Encounter Medications as of 01/31/2017  Medication Sig  . Diphenhyd-Hydrocort-Nystatin (FIRST-DUKES MOUTHWASH) SUSP 1 teaspoon as rinse gargle and spit 4 times a day.please prepare the Dukes mouthwash without methylprednisolone.  Marland Kitchen EPIPEN 2-PAK 0.3 MG/0.3ML SOAJ injection USE SUBCUTANEOUSLY AS DIRECTED  . FLUoxetine (PROZAC) 40 MG capsule Take 1 capsule (40 mg total) by mouth daily.  . hydrOXYzine (ATARAX/VISTARIL) 10 MG tablet Take 1 tablet by mouth as needed for bladder spasms  . LORazepam (ATIVAN) 0.5 MG tablet TAKE 1/2-1 TABLET BY MOUTH EVERY 8 HOURS AS NEEDED FOR ANXIETY  . pantoprazole (PROTONIX) 40 MG tablet Take 1 tablet (40 mg total) by mouth daily.  Marland Kitchen SYNTHROID 50 MCG tablet Take 1 tablet (50 mcg total) by mouth daily before breakfast.  . [DISCONTINUED] omeprazole (PRILOSEC) 20 MG capsule TAKE 1 CAPSULE BY MOUTH DAILY. (Patient not taking: Reported on 01/31/2017)  . [DISCONTINUED] sucralfate (CARAFATE) 1 g  tablet Take 1 tablet (1 g total) by mouth 4 (four) times daily -  with meals and at bedtime. (Patient not taking: Reported on 01/31/2017)   No facility-administered encounter medications on file as of 01/31/2017.      REVIEW OF SYSTEMS  : All other systems reviewed and negative except where noted in the History of Present Illness.   PHYSICAL EXAM: BP 118/76   Pulse 78   Ht 5\' 2"  (1.575 m)   Wt 148 lb 12.8 oz (67.5 kg)   LMP 01/06/2017   SpO2 98%   BMI 27.22 kg/m  General: Well developed white female in no acute distress Head: Normocephalic and atraumatic Eyes:   Sclerae anicteric, conjunctiva pink. Ears: Normal auditory acuity Lungs: Clear throughout to auscultation; no increased WOB. Heart: Regular rate and rhythm; no M/R/G. Abdomen: Soft, non-distended.  BS present.  Non-tender. Musculoskeletal: Symmetrical with no gross deformities  Skin: No lesions on visible extremities Extremities: No edema  Neurological: Alert oriented x 4, grossly non-focal Psychological:  Alert and cooperative. Normal mood and affect  ASSESSMENT AND PLAN: *GERD and substernal/non-cardiac chest pain:  Despite daily PPI.  I am going to max her out on acid reducing medications.  Will increase pantoprazole to BID and she needs to take it 30-60 minutes before meals.  Will add zantac 150 mg at bedtime as well.  Will schedule for EGD with Dr. Hilarie Fredrickson as well.  **The risks, benefits, and alternatives to EGD were discussed with the patient and she consents to proceed.   CC:  Hoyt Koch, *  Addendum: Reviewed and agree with initial management. Pyrtle, Lajuan Lines, MD

## 2017-01-31 NOTE — Patient Instructions (Addendum)
We have sent the following medications to your pharmacy for you to pick up at your convenience: Pantoprazole 40 mg twice a day  Zantac 150 mg at bedtime.  We have given you reflux handout.   You have been scheduled for an endoscopy. Please follow written instructions given to you at your visit today. If you use inhalers (even only as needed), please bring them with you on the day of your procedure. Your physician has requested that you go to www.startemmi.com and enter the access code given to you at your visit today. This web site gives a general overview about your procedure. However, you should still follow specific instructions given to you by our office regarding your preparation for the procedure.

## 2017-02-01 ENCOUNTER — Encounter: Payer: Self-pay | Admitting: Internal Medicine

## 2017-02-01 ENCOUNTER — Ambulatory Visit (AMBULATORY_SURGERY_CENTER): Payer: 59 | Admitting: Internal Medicine

## 2017-02-01 VITALS — BP 118/80 | HR 78 | Temp 98.4°F | Resp 15 | Ht 62.0 in | Wt 148.0 lb

## 2017-02-01 DIAGNOSIS — J219 Acute bronchiolitis, unspecified: Secondary | ICD-10-CM | POA: Diagnosis not present

## 2017-02-01 DIAGNOSIS — K219 Gastro-esophageal reflux disease without esophagitis: Secondary | ICD-10-CM | POA: Diagnosis present

## 2017-02-01 DIAGNOSIS — F329 Major depressive disorder, single episode, unspecified: Secondary | ICD-10-CM | POA: Diagnosis not present

## 2017-02-01 DIAGNOSIS — E669 Obesity, unspecified: Secondary | ICD-10-CM | POA: Diagnosis not present

## 2017-02-01 DIAGNOSIS — E039 Hypothyroidism, unspecified: Secondary | ICD-10-CM | POA: Diagnosis not present

## 2017-02-01 DIAGNOSIS — F419 Anxiety disorder, unspecified: Secondary | ICD-10-CM | POA: Diagnosis not present

## 2017-02-01 MED ORDER — SODIUM CHLORIDE 0.9 % IV SOLN: 500.0000 mL | Freq: Once | INTRAVENOUS | Status: DC

## 2017-02-01 NOTE — Op Note (Signed)
[facility] Patient Name: Michelle Waller Procedure Date: 02/01/2017 3:29 PM MRN: [id number] Endoscopist: Jerene Bears , MD Age: 27 Referring MD:  Date of Birth: [birth year] Gender: Female Account #: [id number] Procedure:                Upper GI endoscopy Indications:              Gastro-esophageal reflux disease, Chest pain (non                            cardiac) Medicines:                Fentanyl 200 micrograms IV, Midazolam 6 mg IV Procedure:                Pre-Anesthesia Assessment:                           - Prior to the procedure, a History and Physical                            was performed, and patient medications and                            allergies were reviewed. The patient's tolerance of                            previous anesthesia was also reviewed. The risks                            and benefits of the procedure and the sedation                            options and risks were discussed with the patient.                            All questions were answered, and informed consent                            was obtained. Prior Anticoagulants: The patient has                            taken no previous anticoagulant or antiplatelet                            agents. ASA Grade Assessment: II - A patient with                            mild systemic disease. After reviewing the risks                            and benefits, the patient was deemed in                            satisfactory condition to undergo the procedure.  After obtaining informed consent, the endoscope was                            passed under direct vision. Throughout the                            procedure, the patient's blood pressure, pulse, and                            oxygen saturations were monitored continuously. The                            Endoscope was introduced through the mouth, and                            advanced to the second part of  duodenum. The upper                            GI endoscopy was accomplished without difficulty.                            The patient tolerated the procedure well. Scope In: Scope Out: Findings:                 Normal mucosa was found in the entire esophagus.                           A 2 cm hiatal hernia was present. The GE junction                            is patulous.                           Segmental mild inflammation characterized by                            erosions and erythema was found in the gastric                            body. Biopsies were taken with a cold forceps for                            histology.                           The examined duodenum was normal. Complications:            No immediate complications. Estimated Blood Loss:     Estimated blood loss was minimal. Impression:               - Normal mucosa was found in the entire esophagus.                           - 2 cm hiatal hernia.                           -  Gastritis. Biopsied.                           - Normal examined duodenum. Recommendation:           - Patient has a contact number available for                            emergencies. The signs and symptoms of potential                            delayed complications were discussed with the                            patient. Return to normal activities tomorrow.                            Written discharge instructions were provided to the                            patient.                           - Resume previous diet.                           - Continue present medications. Would use                            pantoprazole 40 mg twice daily before 1st and last                            meal of the day.                           - Await pathology results.                           - Office follow-up in 2-3 months to ensure                            improvement. Jerene Bears, MD 02/01/2017 3:50:44 PM This report has been  signed electronically.

## 2017-02-01 NOTE — Progress Notes (Signed)
Called to room to assist during endoscopic procedure.  Patient ID and intended procedure confirmed with present staff. Received instructions for my participation in the procedure from the performing physician.  

## 2017-02-01 NOTE — Progress Notes (Signed)
Report to PACU, RN, vss, BBS= Clear.  

## 2017-02-01 NOTE — Patient Instructions (Signed)
YOU HAD AN ENDOSCOPIC PROCEDURE TODAY AT [facility]:   Refer to the procedure report that was given to you for any specific questions about what was found during the examination.  If the procedure report does not answer your questions, please call your gastroenterologist to clarify.  If you requested that your care partner not be given the details of your procedure findings, then the procedure report has been included in a sealed envelope for you to review at your convenience later.  YOU SHOULD EXPECT: Some feelings of bloating in the abdomen. Passage of more gas than usual.  Walking can help get rid of the air that was put into your GI tract during the procedure and reduce the bloating.   Please Note:  You might notice some irritation and congestion in your nose or some drainage.  This is from the oxygen used during your procedure.  There is no need for concern and it should clear up in a day or so.  SYMPTOMS TO REPORT IMMEDIATELY:    Following upper endoscopy (EGD)  Vomiting of blood or coffee ground material  New chest pain or pain under the shoulder blades  Painful or persistently difficult swallowing  New shortness of breath  Fever of 100F or higher  Black, tarry-looking stools  For urgent or emergent issues, a gastroenterologist can be reached at any hour by calling [phone].   DIET:  We do recommend a small meal at first, but then you may proceed to your regular diet.  Drink plenty of fluids but you should avoid alcoholic beverages for 24 hours.  ACTIVITY:  You should plan to take it easy for the rest of today and you should NOT DRIVE or use heavy machinery until tomorrow (because of the sedation medicines used during the test).    FOLLOW UP: Our staff will call the number listed on your records the next business day following your procedure to check on you and address any questions or concerns that you may have regarding the information given to you  following your procedure. If we do not reach you, we will leave a message.  However, if you are feeling well and you are not experiencing any problems, there is no need to return our call.  We will assume that you have returned to your regular daily activities without incident.  If any biopsies were taken you will be contacted by phone or by letter within the next 1-3 weeks.  Please call us at [phone] if you have not heard about the biopsies in 3 weeks.    SIGNATURES/CONFIDENTIALITY: You and/or your care partner have signed paperwork which will be entered into your electronic medical record.  These signatures attest to the fact that that the information above on your After Visit Summary has been reviewed and is understood.  Full responsibility of the confidentiality of this discharge information lies with you and/or your care-partner.  Read all of the handouts given to you by your recovery room nurse.   Take your new medication as directed 1/2 hour before breakfast and dinner.

## 2017-02-01 NOTE — Progress Notes (Signed)
Pt's states no medical or surgical changes since previsit or office visit. 

## 2017-02-02 ENCOUNTER — Telehealth: Payer: Self-pay

## 2017-02-02 NOTE — Telephone Encounter (Signed)
  Follow up Call-  Call back number 02/01/2017  Post procedure Call Back phone  # [phone]  Permission to leave phone message Yes     Patient questions:  Do you have a fever, pain , or abdominal swelling? No. Pain Score  0 *  Have you tolerated food without any problems? Yes.    Have you been able to return to your normal activities? Yes.    Do you have any questions about your discharge instructions: Diet   No. Medications  No. Follow up visit  No.  Do you have questions or concerns about your Care? No.  Actions: * If pain score is 4 or above: No action needed, pain <4.

## 2017-02-07 ENCOUNTER — Encounter: Payer: Self-pay | Admitting: Internal Medicine

## 2017-02-12 ENCOUNTER — Encounter: Payer: Self-pay | Admitting: Internal Medicine

## 2017-02-12 DIAGNOSIS — R6889 Other general symptoms and signs: Secondary | ICD-10-CM

## 2017-03-21 ENCOUNTER — Encounter: Payer: Self-pay | Admitting: Internal Medicine

## 2017-04-02 ENCOUNTER — Emergency Department
Admission: EM | Admit: 2017-04-02 | Discharge: 2017-04-02 | Disposition: A | Discharge status: Home / Self Care | Payer: 59 | Source: Home / Self Care | Attending: Family Medicine | Admitting: Family Medicine

## 2017-04-02 ENCOUNTER — Encounter: Payer: Self-pay | Admitting: Emergency Medicine

## 2017-04-02 ENCOUNTER — Emergency Department (INDEPENDENT_AMBULATORY_CARE_PROVIDER_SITE_OTHER): Payer: 59

## 2017-04-02 DIAGNOSIS — S6992XA Unspecified injury of left wrist, hand and finger(s), initial encounter: Secondary | ICD-10-CM

## 2017-04-02 DIAGNOSIS — S61311A Laceration without foreign body of left index finger with damage to nail, initial encounter: Secondary | ICD-10-CM

## 2017-04-02 DIAGNOSIS — X58XXXA Exposure to other specified factors, initial encounter: Secondary | ICD-10-CM

## 2017-04-02 DIAGNOSIS — S6000XA Contusion of unspecified finger without damage to nail, initial encounter: Secondary | ICD-10-CM

## 2017-04-02 NOTE — ED Provider Notes (Signed)
[name]    CSN: [id number] Arrival date & time: 04/02/17  1732     History   Chief Complaint Chief Complaint  Patient presents with  . Laceration    HPI Michelle Waller is a 27 y.o. female.   While positioning an electric fan today, patient's left index fingertip was accidentally struck by the fan blade resulting in a small laceration.  Her Tdap is current.   The history is provided by the patient.  Laceration  Location:  Finger Finger laceration location:  L index finger Length:  1mm Depth:  Through dermis Quality: straight   Bleeding: controlled   Time since incident:  12 hours Laceration mechanism:  Metal edge Pain details:    Quality:  Dull   Severity:  Mild   Timing:  Constant   Progression:  Improving Foreign body present:  No foreign bodies Relieved by:  None tried Worsened by:  Pressure Ineffective treatments:  None tried Tetanus status:  Up to date Associated symptoms: no numbness and no redness     Past Medical History:  Diagnosis Date  . Eating disorder   . Frequent headaches   . GERD (gastroesophageal reflux disease)   . Kidney stones   . Kidney stones   . Ovarian cyst   . UTI (urinary tract infection)   . Vision abnormalities     Patient Active Problem List   Diagnosis Date Noted  . Substernal chest pain 01/31/2017  . Viral illness 11/15/2016  . Epigastric pain 11/03/2016  . ANA positive 10/31/2016  . Vision disturbance 08/15/2016  . Other headache syndrome 08/15/2016  . Anxiety 08/15/2016  . Pre-syncope 05/31/2016  . Hypothyroidism 02/03/2016  . Panic attack 02/11/2015  . GERD (gastroesophageal reflux disease) 02/11/2015  . Acne 02/11/2015    Past Surgical History:  Procedure Laterality Date  . ADENOIDECTOMY    . COLONOSCOPY    . CYSTOSCOPY    . KNEE SURGERY      OB History    Gravida Para Term Preterm AB Living   0 0 0 0 0 0   SAB TAB Ectopic Multiple Live Births   0 0 0 0         Home Medications      Prior to Admission medications   Medication Sig Start Date End Date Taking? Authorizing Provider  Diphenhyd-Hydrocort-Nystatin (FIRST-DUKES MOUTHWASH) SUSP 1 teaspoon as rinse gargle and spit 4 times a day.please prepare the Dukes mouthwash without methylprednisolone. Patient not taking: Reported on 02/01/2017 11/08/16   Darlyne Russian, MD  EPIPEN 2-PAK 0.3 MG/0.3ML SOAJ injection USE SUBCUTANEOUSLY AS DIRECTED 05/28/14   [provider]  FLUoxetine (PROZAC) 40 MG capsule Take 1 capsule (40 mg total) by mouth daily. 08/15/16   Sater, Nanine Means, MD  hydrOXYzine (ATARAX/VISTARIL) 10 MG tablet Take 1 tablet by mouth as needed for bladder spasms    [provider]  LORazepam (ATIVAN) 0.5 MG tablet TAKE 1/2-1 TABLET BY MOUTH EVERY 8 HOURS AS NEEDED FOR ANXIETY 04/05/16   Hoyt Koch, MD  pantoprazole (PROTONIX) 40 MG tablet Take 1 tablet (40 mg total) by mouth 2 (two) times daily before a meal. 01/31/17   Zehr, Laban Emperor, PA-C  SYNTHROID 50 MCG tablet Take 1 tablet (50 mcg total) by mouth daily before breakfast. 01/04/17   Hoyt Koch, MD    Family History Family History  Problem Relation Age of Onset  . Depression Mother   . Atrial fibrillation Mother   . Hypertension  Father   . Arthritis Maternal Grandmother   . Heart disease Maternal Grandmother   . Cancer Paternal Grandmother        breast  . Hypertension Paternal Grandmother   . Heart disease Paternal Grandfather   . Hypertension Paternal Grandfather   . Diabetes Paternal Grandfather   . Diabetes Mellitus II Sister   . Hypothyroidism Sister     Social History Social History   Tobacco Use  . Smoking status: Former Smoker    Last attempt to quit: 11/09/2011    Years since quitting: 5.4  . Smokeless tobacco: Never Used  Substance Use Topics  . Alcohol use: Yes    Alcohol/week: 0.0 oz    Comment: occasionally   . Drug use: No     Allergies   Soy allergy and Medrol  [methylprednisolone]   Review of Systems Review of Systems  All other systems reviewed and are negative.    Physical Exam Triage Vital Signs ED Triage Vitals [04/02/17 1815]  Enc Vitals Group     BP 130/90     Pulse Rate 72     Resp      Temp 98 F (36.7 C)     Temp Source Oral     SpO2 100 %     Weight 148 lb (67.1 kg)     Height      Head Circumference      Peak Flow      Pain Score 0     Pain Loc      Pain Edu?      Excl. in [place]?    No data found.  Updated Vital Signs BP 130/90 (BP Location: Right Arm)   Pulse 72   Temp 98 F (36.7 C) (Oral)   Wt 148 lb (67.1 kg)   LMP 03/28/2017   SpO2 100%   BMI 27.07 kg/m   Visual Acuity Right Eye Distance:   Left Eye Distance:   Bilateral Distance:    Right Eye Near:   Left Eye Near:    Bilateral Near:     Physical Exam  Constitutional: She appears well-developed and well-nourished. No distress.  HENT:  Head: Atraumatic.  Eyes: Pupils are equal, round, and reactive to light.  Cardiovascular: Normal rate.  Pulmonary/Chest: Effort normal.  Musculoskeletal:       Left hand: She exhibits tenderness, bony tenderness and laceration. She exhibits normal range of motion, normal two-point discrimination, normal capillary refill, no deformity and no swelling. Normal sensation noted.       Hands: The ulnar aspect of left second fingertip has an 31mm simple laceration that involves edge of fingernail as noted on diagram.  Distal phalanx has full range of motion. No subungual hematoma is present.  Neurological: She is alert.  Skin: Skin is warm and dry.  Nursing note and vitals reviewed.    UC Treatments / Results  Labs (all labs ordered are listed, but only abnormal results are displayed) Labs Reviewed - No data to display  EKG  EKG Interpretation None       Radiology No results found.  Procedures Procedures  Laceration Repair (Dermabond) Discussed benefits and risks of procedure and verbal consent  obtained. Using sterile technique, cleansed wound with Betadine followed by copious lavage with normal saline.  Wound carefully inspected for debris and foreign bodies; none found.  Wound edges carefully approximated in normal anatomic position and closed with Dermabond.  Wound precautions explained to patient.     Medications Ordered in  UC Medications - No data to display   Initial Impression / Assessment and Plan / UC Course  I have reviewed the triage vital signs and the nursing notes.  Pertinent labs & imaging results that were available during my care of the patient were reviewed by me and considered in my medical decision making (see chart for details).    Keep wound clean and dry.  Return for any signs of infection (or follow-up with family doctor):  Increasing redness, swelling, pain, heat, drainage, etc. Follow instructions on Dermabond information sheet.     Final Clinical Impressions(s) / UC Diagnoses   Final diagnoses:  Contusion of fingertip, initial encounter  Laceration of left index finger without foreign body with damage to nail, initial encounter    ED Discharge Orders    None           Kandra Nicolas, MD 04/04/17 1153

## 2017-04-02 NOTE — Discharge Instructions (Signed)
Keep wound clean and dry.  Return for any signs of infection (or follow-up with family doctor):  Increasing redness, swelling, pain, heat, drainage, etc. °Follow instructions on Dermabond information sheet.  °

## 2017-04-02 NOTE — ED Triage Notes (Signed)
Pt states she cut her left index finger on a fan around 6am today. States she is UTD on tetanus.

## 2017-04-08 NOTE — Progress Notes (Signed)
[name] [address] [place], [place] [address] Phone: [phone] Subjective:    I'm seeing this patient by the request  of:   Hoyt Koch, MD   CC: Exercise intolerance  UKG:[id number]  Michelle Waller is a 27 y.o. female coming in with complaint of light headedness during workouts. She has been taking B12, iron and Vitamin D. Patient notes that she can get in a confused state with more aerobic activity or with anaerobic activity such as heavy squatting. She has bene having these symptoms for a least one year. Patient does drink a lot of water and eats all 3 meals. Patient works out 3x a week for 1 hour with varying intensities. Patient does not run any more as this caused the most issue. Patient monitors blood pressure which runs within normal values. She has even tried getting off of her birth control.   Patient has even had workup with gastroenterology.  Was found to have a very mild gastritis.  Patient was fairly recently seen in the emergency department where patient did have a ECG done.  This was independently visualized by me showing that patient did have a mild QT prolongation and 0.438.  Also some mild right axis deviation.    Past Medical History:  Diagnosis Date  . Eating disorder   . Frequent headaches   . GERD (gastroesophageal reflux disease)   . Kidney stones   . Kidney stones   . Ovarian cyst   . UTI (urinary tract infection)   . Vision abnormalities    Past Surgical History:  Procedure Laterality Date  . ADENOIDECTOMY    . COLONOSCOPY    . CYSTOSCOPY    . KNEE SURGERY     Social History   Socioeconomic History  . Marital status: Single    Spouse name: None  . Number of children: None  . Years of education: None  . Highest education level: None  Social Needs  . Financial resource strain: None  . Food insecurity - worry: None  . Food insecurity - inability: None  . Transportation needs - medical: None  .  Transportation needs - non-medical: None  Occupational History  . None  Tobacco Use  . Smoking status: Former Smoker    Last attempt to quit: 11/09/2011    Years since quitting: 5.4  . Smokeless tobacco: Never Used  Substance and Sexual Activity  . Alcohol use: Yes    Alcohol/week: 0.0 oz    Comment: occasionally   . Drug use: No  . Sexual activity: None  Other Topics Concern  . None  Social History Narrative  . None   Allergies  Allergen Reactions  . Soy Allergy Anaphylaxis    Large amounts cause anaphylaxis Small amounts cause upset stomach  . Medrol [Methylprednisolone]     Facial swelling    Family History  Problem Relation Age of Onset  . Depression Mother   . Atrial fibrillation Mother   . Hypertension Father   . Arthritis Maternal Grandmother   . Heart disease Maternal Grandmother   . Cancer Paternal Grandmother        breast  . Hypertension Paternal Grandmother   . Heart disease Paternal Grandfather   . Hypertension Paternal Grandfather   . Diabetes Paternal Grandfather   . Diabetes Mellitus II Sister   . Hypothyroidism Sister      Past medical history, social, surgical and family history all reviewed in electronic medical record.  No pertanent  information unless stated regarding to the chief complaint.   Review of Systems:Review of systems updated and as accurate as of 04/09/17  No headache, visual changes, nausea, vomiting, diarrhea, constipation, dizziness, abdominal pain, skin rash, fevers, chills, night sweats, weight loss, swollen lymph nodes, body aches, joint swelling, muscle aches, chest pain, shortness of breath, mood changes.   Objective  Blood pressure 128/84, pulse 76, height 5\' 2"  (1.575 m), weight 154 lb (69.9 kg), last menstrual period 03/28/2017, SpO2 99 %. Systems examined below as of 04/09/17   General: No apparent distress alert and oriented x3 mood and affect normal, dressed appropriately.  HEENT: Pupils equal, extraocular movements  intact  Respiratory: Patient's speak in full sentences and does not appear short of breath  Cardiovascular: No lower extremity edema, non tender, no erythema regular rate and rhythm with a fixed split S2 Skin: Warm dry intact with no signs of infection or rash on extremities or on axial skeleton.  Abdomen: Soft nontender  Neuro: Cranial nerves II through XII are intact, neurovascularly intact in all extremities with 2+ DTRs and 2+ pulses.  Lymph: No lymphadenopathy of posterior or anterior cervical chain or axillae bilaterally.  Gait normal with good balance and coordination.  MSK:  Non tender with full range of motion and good stability and symmetric strength and tone of shoulders, elbows, wrist, hip, knee and ankles bilaterally.       Impression and Recommendations:     This case required medical decision making of moderate complexity.      Note: This dictation was prepared with Dragon dictation along with smaller phrase technology. Any transcriptional errors that result from this process are unintentional.

## 2017-04-09 ENCOUNTER — Other Ambulatory Visit: Payer: Self-pay | Admitting: Internal Medicine

## 2017-04-09 ENCOUNTER — Encounter: Payer: Self-pay | Admitting: Family Medicine

## 2017-04-09 ENCOUNTER — Ambulatory Visit: Payer: 59 | Admitting: Family Medicine

## 2017-04-09 DIAGNOSIS — R072 Precordial pain: Secondary | ICD-10-CM | POA: Diagnosis not present

## 2017-04-09 MED ORDER — ALBUTEROL SULFATE HFA 108 (90 BASE) MCG/ACT IN AERS: 2.0000 | INHALATION_SPRAY | RESPIRATORY_TRACT | 6 refills | Status: DC | PRN

## 2017-04-09 NOTE — Patient Instructions (Signed)
Good to see you  I am banking on the lungs Try albuterol 30 minutes before you workout.  The ECG is near normal and we will watch  Bump up the vitamin D to 4000IU dialy for 1 month then 2000IU daily thereafter Add B6 200mg  daily  Work out and after cardio take HR then and 1 minute later- should drop at least 12 bpm Send me a note in 2 weeks as well to tell me how you are doing  See me again in 3 weeks

## 2017-04-09 NOTE — Telephone Encounter (Signed)
Medication: SYNTHROID 50 MCG tablet - pt states she uses generic - she called pharmacy for refill and there are no refills left - pt is out of medication - requesting to be sent in today  Has the patient contacted their pharmacy? Yes.   (Agent: If no, request that the patient contact the pharmacy for the refill.) Preferred Pharmacy (with phone number or street name): [place], [place] - [place] [phone] (Phone) [phone] (Fax)

## 2017-04-09 NOTE — Assessment & Plan Note (Addendum)
Patient is having some substernal chest pain.  Seems to be more musculoskeletal versus pulmonary.  Patient has had near syncopal episodes with running well.  Patient does have a mild QT prolongation.  Patient is not on any medications that should likely increase that at the dosing. Exercise-induced asthma is within the differential and will give patient an inhaler to try 30 minutes before she works out.  Encourage patient to monitor her other symptoms.  Differential would include some type of potential arrhythmia patient's atrial septal defect.  If continued no trouble will consider an echocardiogram for further evaluation secondary to the right axis deviation.  Patient will continue to monitor and will keep a log while patient is working out.  Abnormal arrhythmias is also within the differential and will need to consider a stress test.

## 2017-04-28 NOTE — Progress Notes (Signed)
[name] [address], [place] [address] Phone: [phone] Subjective:       CC: Chest discomfort with activity  IPJ:[id number]  Michelle Waller is a 27 y.o. female coming in with complaint of chest pain with activity.  Seems to be musculoskeletal versus pulmonary.  Given albuterol to try.  History of gastroesophageal reflux and panic attacks as well.  Has been seen in the emergency department and did have a T QT prolongation 0.438 on EKG but otherwise unremarkable In addition to the albuterol patient was to start vitamin D and monitor heart rate during recovery.  Patient states may be a 50% improvement.  Still has some difficulty in.  Patient is still concerned because it is not completely gone yet.  No side effects to the inhaler.     Past Medical History:  Diagnosis Date  . Eating disorder   . Frequent headaches   . GERD (gastroesophageal reflux disease)   . Kidney stones   . Kidney stones   . Ovarian cyst   . UTI (urinary tract infection)   . Vision abnormalities    Past Surgical History:  Procedure Laterality Date  . ADENOIDECTOMY    . COLONOSCOPY    . CYSTOSCOPY    . KNEE SURGERY     Social History   Socioeconomic History  . Marital status: Single    Spouse name: Not on file  . Number of children: Not on file  . Years of education: Not on file  . Highest education level: Not on file  Occupational History  . Not on file  Social Needs  . Financial resource strain: Not on file  . Food insecurity:    Worry: Not on file    Inability: Not on file  . Transportation needs:    Medical: Not on file    Non-medical: Not on file  Tobacco Use  . Smoking status: Former Smoker    Last attempt to quit: 11/09/2011    Years since quitting: 5.4  . Smokeless tobacco: Never Used  Substance and Sexual Activity  . Alcohol use: Yes    Alcohol/week: 0.0 oz    Comment: occasionally   . Drug use: No  . Sexual activity: Not on file    Lifestyle  . Physical activity:    Days per week: Not on file    Minutes per session: Not on file  . Stress: Not on file  Relationships  . Social connections:    Talks on phone: Not on file    Gets together: Not on file    Attends religious service: Not on file    Active member of club or organization: Not on file    Attends meetings of clubs or organizations: Not on file    Relationship status: Not on file  Other Topics Concern  . Not on file  Social History Narrative  . Not on file   Allergies  Allergen Reactions  . Soy Allergy Anaphylaxis    Large amounts cause anaphylaxis Small amounts cause upset stomach  . Medrol [Methylprednisolone]     Facial swelling    Family History  Problem Relation Age of Onset  . Depression Mother   . Atrial fibrillation Mother   . Hypertension Father   . Arthritis Maternal Grandmother   . Heart disease Maternal Grandmother   . Cancer Paternal Grandmother        breast  . Hypertension Paternal Grandmother   . Heart disease Paternal Grandfather   .  Hypertension Paternal Grandfather   . Diabetes Paternal Grandfather   . Diabetes Mellitus II Sister   . Hypothyroidism Sister      Past medical history, social, surgical and family history all reviewed in electronic medical record.  No pertanent information unless stated regarding to the chief complaint.   Review of Systems:Review of systems updated and as accurate as of 04/30/17  No headache, visual changes, nausea, vomiting, diarrhea, constipation, dizziness, abdominal pain, skin rash, fevers, chills, night sweats, weight loss, swollen lymph nodes, body aches, joint swelling, chest pain, shortness of breath, mood changes.  Positive muscle aches  Objective  Blood pressure 136/82, pulse 68, height 5\' 2"  (1.575 m), weight 152 lb (68.9 kg), SpO2 98 %. Systems examined below as of 04/30/17   General: No apparent distress alert and oriented x3 mood and affect normal, dressed appropriately.   HEENT: Pupils equal, extraocular movements intact  Respiratory: Patient's speak in full sentences and does not appear short of breath  Cardiovascular: No lower extremity edema, non tender, no erythema regular rate and rhythm no murmur appreciated Skin: Warm dry intact with no signs of infection or rash on extremities or on axial skeleton.  Abdomen: Soft nontender  Neuro: Cranial nerves II through XII are intact, neurovascularly intact in all extremities with 2+ DTRs and 2+ pulses.  Lymph: No lymphadenopathy of posterior or anterior cervical chain or axillae bilaterally.  Gait normal with good balance and coordination.  MSK:  Non tender with full range of motion and good stability and symmetric strength and tone of shoulders, elbows, wrist, hip, knee and ankles bilaterally.    Back exam shows some tightness between the parascapular areas.  Mild rotation to the right.  Osteopathic findings C2 flexed rotated and side bent right T3 extended rotated and side bent right inhaled third rib T6 extended rotated and side bent left L3 flexed rotated and side bent right Sacrum right on right     Impression and Recommendations:     This case required medical decision making of moderate complexity.      Note: This dictation was prepared with Dragon dictation along with smaller phrase technology. Any transcriptional errors that result from this process are unintentional.

## 2017-04-30 ENCOUNTER — Encounter: Payer: Self-pay | Admitting: Family Medicine

## 2017-04-30 ENCOUNTER — Ambulatory Visit: Payer: 59 | Admitting: Family Medicine

## 2017-04-30 DIAGNOSIS — M999 Biomechanical lesion, unspecified: Secondary | ICD-10-CM | POA: Insufficient documentation

## 2017-04-30 DIAGNOSIS — M940 Chondrocostal junction syndrome [Tietze]: Secondary | ICD-10-CM

## 2017-04-30 NOTE — Assessment & Plan Note (Signed)
Slipped rib syndrome.  Attempted osteopathic manipulation.  I do believe that there is still a potential pulmonary component to.  We may want to consider possible workup with pulmonary function test for potential stress testing and echocardiogram.  Patient wants to hold on these.  We discussed that patient's Prozac could also be a contributing factor and may consider some other potential medications.  Patient encouraged with some of the results at the moment.  Given a trial of Dulera.  Follow-up again 4 weeks

## 2017-04-30 NOTE — Patient Instructions (Signed)
Good to see you  Tried manipulation today  Try new inhaler 1 spray daily  Keep being active and see if any other trigger Read about effexor or cymbalta and see what you think See me again in 4-6 weeks

## 2017-04-30 NOTE — Assessment & Plan Note (Signed)
Decision today to treat with OMT was based on Physical Exam  After verbal consent patient was treated with HVLA, ME, FPR techniques in cervical, thoracic, rib, lumbar and sacral areas  Patient tolerated the procedure well with improvement in symptoms  Patient given exercises, stretches and lifestyle modifications  See medications in patient instructions if given  Patient will follow up in 6 weeks 

## 2017-05-25 ENCOUNTER — Telehealth: Payer: 59 | Admitting: Family

## 2017-05-25 DIAGNOSIS — R059 Cough, unspecified: Secondary | ICD-10-CM

## 2017-05-25 DIAGNOSIS — R05 Cough: Secondary | ICD-10-CM

## 2017-05-25 MED ORDER — BENZONATATE 100 MG PO CAPS: 100.0000 mg | ORAL_CAPSULE | Freq: Three times a day (TID) | ORAL | 0 refills | Status: DC | PRN

## 2017-05-25 NOTE — Progress Notes (Signed)
Thank you for the details you included in the comment boxes. Those details are very helpful in determining the best course of treatment for you and help [place] to provide the best care.  We are sorry that you are not feeling well.  Here is how we plan to help!  Based on your presentation I believe you most likely have A cough due to allergies.  I recommend that you start the an over-the counter-allergy medication such as Claritin 10 mg or Zyrtec 10 mg daily.     In addition you may use A prescription cough medication called Tessalon Perles 100mg . You may take 1-2 capsules every 8 hours as needed for your cough.  Please continue using the flonase.   From your responses in the eVisit questionnaire you describe inflammation in the upper respiratory tract which is causing a significant cough.  This is commonly called Bronchitis and has four common causes:    Allergies  Viral Infections  Acid Reflux  Bacterial Infection Allergies, viruses and acid reflux are treated by controlling symptoms or eliminating the cause. An example might be a cough caused by taking certain blood pressure medications. You stop the cough by changing the medication. Another example might be a cough caused by acid reflux. Controlling the reflux helps control the cough.  USE OF BRONCHODILATOR ("RESCUE") INHALERS: There is a risk from using your bronchodilator too frequently.  The risk is that over-reliance on a medication which only relaxes the muscles surrounding the breathing tubes can reduce the effectiveness of medications prescribed to reduce swelling and congestion of the tubes themselves.  Although you feel brief relief from the bronchodilator inhaler, your asthma may actually be worsening with the tubes becoming more swollen and filled with mucus.  This can delay other crucial treatments, such as oral steroid medications. If you need to use a bronchodilator inhaler daily, several times per day, you should discuss this with  your provider.  There are probably better treatments that could be used to keep your asthma under control.     HOME CARE . Only take medications as instructed by your medical team. . Complete the entire course of an antibiotic. . Drink plenty of fluids and get plenty of rest. . Avoid close contacts especially the very young and the elderly . Cover your mouth if you cough or cough into your sleeve. . Always remember to wash your hands . A steam or ultrasonic humidifier can help congestion.   GET HELP RIGHT AWAY IF: . You develop worsening fever. . You become short of breath . You cough up blood. . Your symptoms persist after you have completed your treatment plan MAKE SURE YOU   Understand these instructions.  Will watch your condition.  Will get help right away if you are not doing well or get worse.  Your e-visit answers were reviewed by a board certified advanced clinical practitioner to complete your personal care plan.  Depending on the condition, your plan could have included both over the counter or prescription medications. If there is a problem please reply  once you have received a response from your provider. Your safety is important to [place].  If you have drug allergies check your prescription carefully.    You can use MyChart to ask questions about today's visit, request a non-urgent call back, or ask for a work or school excuse for 24 hours related to this e-Visit. If it has been greater than 24 hours you will need to follow up with  your provider, or enter a new e-Visit to address those concerns. You will get an e-mail in the next two days asking about your experience.  I hope that your e-visit has been valuable and will speed your recovery. Thank you for using e-visits.

## 2017-06-03 NOTE — Progress Notes (Signed)
[name] [place], [place] [address] Phone: [phone] Subjective:    I'm seeing this patient by the request  of:    CC: Back pain follow-up  PRF:[id number]  Shizue Kaseman is a 27 y.o. female coming in with complaint of substernal chest pain.  Patient was seen previously.  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  Patient has been feeling significantly better at this point.  Patient states that she has not had any of the chest discomfort at all as long as she uses the albuterol before activity.  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  Very minimal discomfort.    Past Medical History:  Diagnosis Date  . Eating disorder   . Frequent headaches   . GERD (gastroesophageal reflux disease)   . Kidney stones   . Kidney stones   . Ovarian cyst   . UTI (urinary tract infection)   . Vision abnormalities    Past Surgical History:  Procedure Laterality Date  . ADENOIDECTOMY    . COLONOSCOPY    . CYSTOSCOPY    . KNEE SURGERY     Social History   Socioeconomic History  . Marital status: Single    Spouse name: Not on file  . Number of children: Not on file  . Years of education: Not on file  . Highest education level: Not on file  Occupational History  . Not on file  Social Needs  . Financial resource strain: Not on file  . Food insecurity:    Worry: Not on file    Inability: Not on file  . Transportation needs:    Medical: Not on file    Non-medical: Not on file  Tobacco Use  . Smoking status: Former Smoker    Last attempt to quit: 11/09/2011    Years since quitting: 5.5  . Smokeless tobacco: Never Used  Substance and Sexual Activity  . Alcohol use: Yes    Alcohol/week: 0.0 oz    Comment: occasionally   . Drug use: No  . Sexual activity: Not on file  Lifestyle  . Physical activity:    Days per week: Not on file    Minutes per session: Not on file  . Stress: Not on file  Relationships  .  Social connections:    Talks on phone: Not on file    Gets together: Not on file    Attends religious service: Not on file    Active member of club or organization: Not on file    Attends meetings of clubs or organizations: Not on file    Relationship status: Not on file  Other Topics Concern  . Not on file  Social History Narrative  . Not on file   Allergies  Allergen Reactions  . Soy Allergy Anaphylaxis    Large amounts cause anaphylaxis Small amounts cause upset stomach  . Medrol [Methylprednisolone]     Facial swelling    Family History  Problem Relation Age of Onset  . Depression Mother   . Atrial fibrillation Mother   . Hypertension Father   . Arthritis Maternal Grandmother   . Heart disease Maternal Grandmother   . Cancer Paternal Grandmother        breast  . Hypertension Paternal Grandmother   . Heart disease Paternal Grandfather   . Hypertension Paternal Grandfather   . Diabetes Paternal Grandfather   . Diabetes Mellitus II Sister   . Hypothyroidism Sister  Past medical history, social, surgical and family history all reviewed in electronic medical record.  No pertanent information unless stated regarding to the chief complaint.   Review of Systems:Review of systems updated and as accurate as of 06/04/17  No headache, visual changes, nausea, vomiting, diarrhea, constipation, dizziness, abdominal pain, skin rash, fevers, chills, night sweats, weight loss, swollen lymph nodes, body aches, joint swelling, muscle aches, chest pain, shortness of breath, mood changes.   Objective  Blood pressure 112/74, pulse 80, height 5\' 2"  (1.575 m), weight 152 lb (68.9 kg), SpO2 99 %. Systems examined below as of 06/04/17   General: No apparent distress alert and oriented x3 mood and affect normal, dressed appropriately.  HEENT: Pupils equal, extraocular movements intact  Respiratory: Patient's speak in full sentences and does not appear short of breath  Cardiovascular:  No lower extremity edema, non tender, no erythema  Skin: Warm dry intact with no signs of infection or rash on extremities or on axial skeleton.  Abdomen: Soft nontender  Neuro: Cranial nerves II through XII are intact, neurovascularly intact in all extremities with 2+ DTRs and 2+ pulses.  Lymph: No lymphadenopathy of posterior or anterior cervical chain or axillae bilaterally.  Gait normal with good balance and coordination.  MSK:  Non tender with full range of motion and good stability and symmetric strength and tone of shoulders, elbows, wrist, hip, knee and ankles bilaterally.  Back Exam:  Inspection: Unremarkable  Motion: Flexion 45 deg, Extension 35 deg, Side Bending to 35 deg bilaterally,  Rotation to 45 deg bilaterally  SLR laying: Negative  XSLR laying: Negative  Palpable tenderness: Mild tenderness in the thoracolumbar junction.Marland Kitchen FABER: negative. Sensory change: Gross sensation intact to all lumbar and sacral dermatomes.  Reflexes: 2+ at both patellar tendons, 2+ at achilles tendons, Babinski's downgoing.  Strength at foot  Plantar-flexion: 5/5 Dorsi-flexion: 5/5 Eversion: 5/5 Inversion: 5/5  Leg strength  Quad: 5/5 Hamstring: 5/5 Hip flexor: 5/5 Hip abductors: 5/5  Gait unremarkable.  Osteopathic findings C2 flexed rotated and side bent right C4 flexed rotated and side bent left T3 extended rotated and side bent right inhaled third rib T6 extended rotated and side bent left L1 flexed rotated and side bent right Sacrum right on right     Impression and Recommendations:     This case required medical decision making of moderate complexity.      Note: This dictation was prepared with Dragon dictation along with smaller phrase technology. Any transcriptional errors that result from this process are unintentional.

## 2017-06-04 ENCOUNTER — Ambulatory Visit (INDEPENDENT_AMBULATORY_CARE_PROVIDER_SITE_OTHER): Payer: 59 | Admitting: Family Medicine

## 2017-06-04 ENCOUNTER — Encounter: Payer: Self-pay | Admitting: Family Medicine

## 2017-06-04 VITALS — BP 112/74 | HR 80 | Ht 62.0 in | Wt 152.0 lb

## 2017-06-04 DIAGNOSIS — M999 Biomechanical lesion, unspecified: Secondary | ICD-10-CM

## 2017-06-04 DIAGNOSIS — M940 Chondrocostal junction syndrome [Tietze]: Secondary | ICD-10-CM | POA: Diagnosis not present

## 2017-06-04 NOTE — Assessment & Plan Note (Signed)
Responding well to manipulation.  Continue conservative therapy including scapular stabilization exercises.  Follow-up again in 4 to 8 weeks

## 2017-06-04 NOTE — Assessment & Plan Note (Signed)
Decision today to treat with OMT was based on Physical Exam  After verbal consent patient was treated with HVLA, ME, FPR techniques in cervical, thoracic, rib areas  Patient tolerated the procedure well with improvement in symptoms  Patient given exercises, stretches and lifestyle modifications  See medications in patient instructions if given  Patient will follow up in 4-6 weeks 

## 2017-06-04 NOTE — Patient Instructions (Signed)
Good to see you  You are doing great  Keep it up  Lets not change a thing except for how frequent you need to see me  See me again in 3-4 months!

## 2017-06-13 ENCOUNTER — Ambulatory Visit (INDEPENDENT_AMBULATORY_CARE_PROVIDER_SITE_OTHER): Payer: 59 | Admitting: Internal Medicine

## 2017-06-13 ENCOUNTER — Other Ambulatory Visit (INDEPENDENT_AMBULATORY_CARE_PROVIDER_SITE_OTHER): Payer: 59

## 2017-06-13 ENCOUNTER — Encounter: Payer: Self-pay | Admitting: Internal Medicine

## 2017-06-13 VITALS — BP 118/70 | HR 68 | Temp 98.0°F | Ht 62.0 in | Wt 146.0 lb

## 2017-06-13 DIAGNOSIS — K219 Gastro-esophageal reflux disease without esophagitis: Secondary | ICD-10-CM | POA: Diagnosis not present

## 2017-06-13 DIAGNOSIS — Z0000 Encounter for general adult medical examination without abnormal findings: Secondary | ICD-10-CM | POA: Diagnosis not present

## 2017-06-13 DIAGNOSIS — E039 Hypothyroidism, unspecified: Secondary | ICD-10-CM | POA: Diagnosis not present

## 2017-06-13 DIAGNOSIS — F419 Anxiety disorder, unspecified: Secondary | ICD-10-CM

## 2017-06-13 LAB — CBC
HCT: 39.1 % (ref 36.0–46.0)
Hemoglobin: 13.5 g/dL (ref 12.0–15.0)
MCHC: 34.5 g/dL (ref 30.0–36.0)
MCV: 89.6 fl (ref 78.0–100.0)
Platelets: 197 10*3/uL (ref 150.0–400.0)
RBC: 4.37 Mil/uL (ref 3.87–5.11)
RDW: 12 % (ref 11.5–15.5)
WBC: 4.3 10*3/uL (ref 4.0–10.5)

## 2017-06-13 LAB — COMPREHENSIVE METABOLIC PANEL
ALT: 25 U/L (ref 0–35)
AST: 26 U/L (ref 0–37)
Albumin: 4.5 g/dL (ref 3.5–5.2)
Alkaline Phosphatase: 70 U/L (ref 39–117)
BUN: 16 mg/dL (ref 6–23)
CO2: 27 mEq/L (ref 19–32)
Calcium: 9.6 mg/dL (ref 8.4–10.5)
Chloride: 102 mEq/L (ref 96–112)
Creatinine, Ser: 0.78 mg/dL (ref 0.40–1.20)
GFR: 93.4 mL/min (ref >60.00)
Glucose, Bld: 83 mg/dL (ref 70–99)
Potassium: 3.9 mEq/L (ref 3.5–5.1)
Sodium: 139 mEq/L (ref 135–145)
Total Bilirubin: 0.5 mg/dL (ref 0.2–1.2)
Total Protein: 7.6 g/dL (ref 6.0–8.3)

## 2017-06-13 LAB — LIPID PANEL
Cholesterol: 152 mg/dL (ref 0–200)
HDL: 60.5 mg/dL (ref >39.00)
LDL Cholesterol: 82 mg/dL (ref 0–99)
NonHDL: 91.7
Total CHOL/HDL Ratio: 3
Triglycerides: 48 mg/dL (ref 0.0–149.0)
VLDL: 9.6 mg/dL (ref 0.0–40.0)

## 2017-06-13 LAB — VITAMIN D 25 HYDROXY (VIT D DEFICIENCY, FRACTURES): VITD: 40.4 ng/mL (ref 30.00–100.00)

## 2017-06-13 LAB — T4, FREE: Free T4: 0.93 ng/dL (ref 0.60–1.60)

## 2017-06-13 LAB — TSH: TSH: 0.97 u[IU]/mL (ref 0.35–4.50)

## 2017-06-13 NOTE — Patient Instructions (Signed)
We will check the labs and call you back about the results.   Health Maintenance, Female Adopting a healthy lifestyle and getting preventive care can go a long way to promote health and wellness. Talk with your health care provider about what schedule of regular examinations is right for you. This is a good chance for you to check in with your provider about disease prevention and staying healthy. In between checkups, there are plenty of things you can do on your own. Experts have done a lot of research about which lifestyle changes and preventive measures are most likely to keep you healthy. Ask your health care provider for more information. Weight and diet Eat a healthy diet  Be sure to include plenty of vegetables, fruits, low-fat dairy products, and lean protein.  Do not eat a lot of foods high in solid fats, added sugars, or salt.  Get regular exercise. This is one of the most important things you can do for your health. ? Most adults should exercise for at least 150 minutes each week. The exercise should increase your heart rate and make you sweat (moderate-intensity exercise). ? Most adults should also do strengthening exercises at least twice a week. This is in addition to the moderate-intensity exercise.  Maintain a healthy weight  Body mass index (BMI) is a measurement that can be used to identify possible weight problems. It estimates body fat based on height and weight. Your health care provider can help determine your BMI and help you achieve or maintain a healthy weight.  For females 22 years of age and older: ? A BMI below 18.5 is considered underweight. ? A BMI of 18.5 to 24.9 is normal. ? A BMI of 25 to 29.9 is considered overweight. ? A BMI of 30 and above is considered obese.  Watch levels of cholesterol and blood lipids  You should start having your blood tested for lipids and cholesterol at 28 years of age, then have this test every 5 years.  You may need to have  your cholesterol levels checked more often if: ? Your lipid or cholesterol levels are high. ? You are older than 28 years of age. ? You are at high risk for heart disease.  Cancer screening Lung Cancer  Lung cancer screening is recommended for adults 23-53 years old who are at high risk for lung cancer because of a history of smoking.  A yearly low-dose CT scan of the lungs is recommended for people who: ? Currently smoke. ? Have quit within the past 15 years. ? Have at least a 30-pack-year history of smoking. A pack year is smoking an average of one pack of cigarettes a day for 1 year.  Yearly screening should continue until it has been 15 years since you quit.  Yearly screening should stop if you develop a health problem that would prevent you from having lung cancer treatment.  Breast Cancer  Practice breast self-awareness. This means understanding how your breasts normally appear and feel.  It also means doing regular breast self-exams. Let your health care provider know about any changes, no matter how small.  If you are in your 20s or 30s, you should have a clinical breast exam (CBE) by a health care provider every 1-3 years as part of a regular health exam.  If you are 35 or older, have a CBE every year. Also consider having a breast X-ray (mammogram) every year.  If you have a family history of breast cancer, talk to  your health care provider about genetic screening.  If you are at high risk for breast cancer, talk to your health care provider about having an MRI and a mammogram every year.  Breast cancer gene (BRCA) assessment is recommended for women who have family members with BRCA-related cancers. BRCA-related cancers include: ? Breast. ? Ovarian. ? Tubal. ? Peritoneal cancers.  Results of the assessment will determine the need for genetic counseling and BRCA1 and BRCA2 testing.  Cervical Cancer Your health care provider may recommend that you be screened  regularly for cancer of the pelvic organs (ovaries, uterus, and vagina). This screening involves a pelvic examination, including checking for microscopic changes to the surface of your cervix (Pap test). You may be encouraged to have this screening done every 3 years, beginning at age 80.  For women ages 9-65, health care providers may recommend pelvic exams and Pap testing every 3 years, or they may recommend the Pap and pelvic exam, combined with testing for human papilloma virus (HPV), every 5 years. Some types of HPV increase your risk of cervical cancer. Testing for HPV may also be done on women of any age with unclear Pap test results.  Other health care providers may not recommend any screening for nonpregnant women who are considered low risk for pelvic cancer and who do not have symptoms. Ask your health care provider if a screening pelvic exam is right for you.  If you have had past treatment for cervical cancer or a condition that could lead to cancer, you need Pap tests and screening for cancer for at least 20 years after your treatment. If Pap tests have been discontinued, your risk factors (such as having a new sexual partner) need to be reassessed to determine if screening should resume. Some women have medical problems that increase the chance of getting cervical cancer. In these cases, your health care provider may recommend more frequent screening and Pap tests.  Colorectal Cancer  This type of cancer can be detected and often prevented.  Routine colorectal cancer screening usually begins at 28 years of age and continues through 28 years of age.  Your health care provider may recommend screening at an earlier age if you have risk factors for colon cancer.  Your health care provider may also recommend using home test kits to check for hidden blood in the stool.  A small camera at the end of a tube can be used to examine your colon directly (sigmoidoscopy or colonoscopy). This is  done to check for the earliest forms of colorectal cancer.  Routine screening usually begins at age 45.  Direct examination of the colon should be repeated every 5-10 years through 28 years of age. However, you may need to be screened more often if early forms of precancerous polyps or small growths are found.  Skin Cancer  Check your skin from head to toe regularly.  Tell your health care provider about any new moles or changes in moles, especially if there is a change in a mole's shape or color.  Also tell your health care provider if you have a mole that is larger than the size of a pencil eraser.  Always use sunscreen. Apply sunscreen liberally and repeatedly throughout the day.  Protect yourself by wearing long sleeves, pants, a wide-brimmed hat, and sunglasses whenever you are outside.  Heart disease, diabetes, and high blood pressure  High blood pressure causes heart disease and increases the risk of stroke. High blood pressure is more likely  to develop in: ? People who have blood pressure in the high end of the normal range (130-139/85-89 mm Hg). ? People who are overweight or obese. ? People who are African American.  If you are 41-24 years of age, have your blood pressure checked every 3-5 years. If you are 31 years of age or older, have your blood pressure checked every year. You should have your blood pressure measured twice-once when you are at a hospital or clinic, and once when you are not at a hospital or clinic. Record the average of the two measurements. To check your blood pressure when you are not at a hospital or clinic, you can use: ? An automated blood pressure machine at a pharmacy. ? A home blood pressure monitor.  If you are between 45 years and 72 years old, ask your health care provider if you should take aspirin to prevent strokes.  Have regular diabetes screenings. This involves taking a blood sample to check your fasting blood sugar level. ? If you are  at a normal weight and have a low risk for diabetes, have this test once every three years after 28 years of age. ? If you are overweight and have a high risk for diabetes, consider being tested at a younger age or more often. Preventing infection Hepatitis B  If you have a higher risk for hepatitis B, you should be screened for this virus. You are considered at high risk for hepatitis B if: ? You were born in a country where hepatitis B is common. Ask your health care provider which countries are considered high risk. ? Your parents were born in a high-risk country, and you have not been immunized against hepatitis B (hepatitis B vaccine). ? You have HIV or AIDS. ? You use needles to inject street drugs. ? You live with someone who has hepatitis B. ? You have had sex with someone who has hepatitis B. ? You get hemodialysis treatment. ? You take certain medicines for conditions, including cancer, organ transplantation, and autoimmune conditions.  Hepatitis C  Blood testing is recommended for: ? Everyone born from [birth year] through 1966. ? Anyone with known risk factors for hepatitis C.  Sexually transmitted infections (STIs)  You should be screened for sexually transmitted infections (STIs) including gonorrhea and chlamydia if: ? You are sexually active and are younger than 28 years of age. ? You are older than 28 years of age and your health care provider tells you that you are at risk for this type of infection. ? Your sexual activity has changed since you were last screened and you are at an increased risk for chlamydia or gonorrhea. Ask your health care provider if you are at risk.  If you do not have HIV, but are at risk, it may be recommended that you take a prescription medicine daily to prevent HIV infection. This is called pre-exposure prophylaxis (PrEP). You are considered at risk if: ? You are sexually active and do not regularly use condoms or know the HIV status of your  partner(s). ? You take drugs by injection. ? You are sexually active with a partner who has HIV.  Talk with your health care provider about whether you are at high risk of being infected with HIV. If you choose to begin PrEP, you should first be tested for HIV. You should then be tested every 3 months for as long as you are taking PrEP. Pregnancy  If you are premenopausal and you may become  pregnant, ask your health care provider about preconception counseling.  If you may become pregnant, take 400 to 800 micrograms (mcg) of folic acid every day.  If you want to prevent pregnancy, talk to your health care provider about birth control (contraception). Osteoporosis and menopause  Osteoporosis is a disease in which the bones lose minerals and strength with aging. This can result in serious bone fractures. Your risk for osteoporosis can be identified using a bone density scan.  If you are 72 years of age or older, or if you are at risk for osteoporosis and fractures, ask your health care provider if you should be screened.  Ask your health care provider whether you should take a calcium or vitamin D supplement to lower your risk for osteoporosis.  Menopause may have certain physical symptoms and risks.  Hormone replacement therapy may reduce some of these symptoms and risks. Talk to your health care provider about whether hormone replacement therapy is right for you. Follow these instructions at home:  Schedule regular health, dental, and eye exams.  Stay current with your immunizations.  Do not use any tobacco products including cigarettes, chewing tobacco, or electronic cigarettes.  If you are pregnant, do not drink alcohol.  If you are breastfeeding, limit how much and how often you drink alcohol.  Limit alcohol intake to no more than 1 drink per day for nonpregnant women. One drink equals 12 ounces of beer, 5 ounces of wine, or 1 ounces of hard liquor.  Do not use street  drugs.  Do not share needles.  Ask your health care provider for help if you need support or information about quitting drugs.  Tell your health care provider if you often feel depressed.  Tell your health care provider if you have ever been abused or do not feel safe at home. This information is not intended to replace advice given to you by your health care provider. Make sure you discuss any questions you have with your health care provider. Document Released: 07/25/2010 Document Revised: 06/17/2015 Document Reviewed: 10/13/2014 Elsevier Interactive Patient Education  [employer].

## 2017-06-13 NOTE — Progress Notes (Signed)
   Subjective:    Patient ID: Michelle Waller, female    DOB: 1990/11/03, 27 y.o.   MRN: [id number]  HPI The patient is a 27 YO female coming in for physical. Doing well overall.   PMH, [facility], social history reviewed and updated.   Review of Systems  Constitutional: Negative.   HENT: Negative.   Eyes: Negative.   Respiratory: Negative for cough, chest tightness and shortness of breath.   Cardiovascular: Negative for chest pain, palpitations and leg swelling.  Gastrointestinal: Negative for abdominal distention, abdominal pain, constipation, diarrhea, nausea and vomiting.  Musculoskeletal: Negative.   Skin: Negative.   Neurological: Negative.   Psychiatric/Behavioral: Negative.       Objective:   Physical Exam  Constitutional: She is oriented to person, place, and time. She appears well-developed and well-nourished.  HENT:  Head: Normocephalic and atraumatic.  Eyes: EOM are normal.  Neck: Normal range of motion.  Cardiovascular: Normal rate and regular rhythm.  Pulmonary/Chest: Effort normal and breath sounds normal. No respiratory distress. She has no wheezes. She has no rales.  Abdominal: Soft. Bowel sounds are normal. She exhibits no distension. There is no tenderness. There is no rebound.  Musculoskeletal: She exhibits no edema.  Neurological: She is alert and oriented to person, place, and time. Coordination normal.  Skin: Skin is warm and dry.  Psychiatric: She has a normal mood and affect.   Vitals:   06/13/17 1042  BP: 118/70  Pulse: 68  Temp: 98 F (36.7 C)  TempSrc: Oral  SpO2: 99%  Weight: 146 lb (66.2 kg)  Height: 5\' 2"  (1.575 m)      Assessment & Plan:

## 2017-06-14 DIAGNOSIS — Z0000 Encounter for general adult medical examination without abnormal findings: Secondary | ICD-10-CM | POA: Insufficient documentation

## 2017-06-14 MED ORDER — FLUOXETINE HCL 20 MG PO CAPS: 20.0000 mg | ORAL_CAPSULE | Freq: Every day | ORAL | 3 refills | Status: DC

## 2017-06-14 MED ORDER — FLUOXETINE HCL 10 MG PO CAPS: 10.0000 mg | ORAL_CAPSULE | Freq: Every day | ORAL | 3 refills | Status: DC

## 2017-06-14 NOTE — Assessment & Plan Note (Signed)
Taking protonix daily which is helping symptoms some, she has radically changed her diet to help also.

## 2017-06-14 NOTE — Assessment & Plan Note (Signed)
Checking TSH and free T4 and adjust synthroid 50 mcg daily if needed.  

## 2017-06-14 NOTE — Assessment & Plan Note (Signed)
Wants to try to decrease prozac dosing some to 30 mg from 40 mg. Rare ativan for panic attacks.

## 2017-06-14 NOTE — Assessment & Plan Note (Signed)
Declines need for HIV screening. Pap smear up to date. Flu shot at work yearly. Tetanus up to date. Counseled about dangers of distracted driving, sun safety and mole surveillance. Given screening recommendations.

## 2017-06-19 LAB — RETICULIN ANTIBODIES, IGA W TITER: Reticulin IGA Screen: NEGATIVE

## 2017-06-19 LAB — TISSUE TRANSGLUTAMINASE, IGA: (tTG) Ab, IgA: 1 U/mL

## 2017-06-19 LAB — GLIADIN ANTIBODIES, SERUM
Gliadin IgA: 4 Units
Gliadin IgG: 3 Units

## 2017-06-21 ENCOUNTER — Encounter: Payer: Self-pay | Admitting: Gastroenterology

## 2017-06-21 ENCOUNTER — Ambulatory Visit (INDEPENDENT_AMBULATORY_CARE_PROVIDER_SITE_OTHER): Payer: 59 | Admitting: Gastroenterology

## 2017-06-21 VITALS — BP 110/70 | HR 68 | Ht 62.0 in | Wt 146.6 lb

## 2017-06-21 DIAGNOSIS — K589 Irritable bowel syndrome without diarrhea: Secondary | ICD-10-CM | POA: Diagnosis not present

## 2017-06-21 NOTE — Patient Instructions (Signed)
You may use Imodium as needed.  Please follow up as needed

## 2017-06-21 NOTE — Progress Notes (Addendum)
06/21/2017 Michelle Waller [id number] 1990-12-23   HISTORY OF PRESENT ILLNESS: This is a pleasant 27 year old female who was seen here in January 2019 for complaints of reflux related issues.  She is a patient of Dr. Vena Rua.  She presents to our office today with complaints of what she calls a change in bowel habits.  She reports GI issues of some sort on and off since she was 27 years old.  She reports having a colonoscopy when she was 13 that was reportedly normal and she was told that she had IBS.  Recently she reports severe diarrhea after eating certain foods.  It seems mostly like possibly cream or dairy related products and other stuff with syrups in them.  If she avoids those foods then she moves her bowels normally on a regular basis.  When she does get the diarrhea it is associated with a lot of abdominal cramping.  She recalls being placed on medication, Levsin, when she was younger as well.  She denies weight loss.  There has been no blood in her stool.  She has no ongoing abdominal pain other than cramping associated with the diarrheal episodes.  She thinks that some dairy products bother her.  Recent TSH was normal and celiac labs negative.  CBC and CMP normal as well.   Past Medical History:  Diagnosis Date  . Eating disorder   . Frequent headaches   . GERD (gastroesophageal reflux disease)   . Kidney stones   . Kidney stones   . Ovarian cyst   . UTI (urinary tract infection)   . Vision abnormalities    Past Surgical History:  Procedure Laterality Date  . ADENOIDECTOMY    . COLONOSCOPY    . CYSTOSCOPY    . KNEE SURGERY      reports that she quit smoking about 5 years ago. She has never used smokeless tobacco. She reports that she drinks alcohol. She reports that she does not use drugs. family history includes Arthritis in her maternal grandmother; Atrial fibrillation in her mother; Cancer in her paternal grandmother; Depression in her mother; Diabetes in her paternal  grandfather; Diabetes Mellitus II in her sister; Heart disease in her maternal grandmother and paternal grandfather; Hypertension in her father, paternal grandfather, and paternal grandmother; Hypothyroidism in her sister. Allergies  Allergen Reactions  . Soy Allergy Anaphylaxis    Large amounts cause anaphylaxis Small amounts cause upset stomach  . Medrol [Methylprednisolone]     Facial swelling       Outpatient Encounter Medications as of 06/21/2017  Medication Sig  . albuterol (PROVENTIL HFA;VENTOLIN HFA) 108 (90 Base) MCG/ACT inhaler Inhale 2 puffs into the lungs every 4 (four) hours as needed for wheezing or shortness of breath.  . EPIPEN 2-PAK 0.3 MG/0.3ML SOAJ injection USE SUBCUTANEOUSLY AS DIRECTED  . FLUoxetine (PROZAC) 10 MG capsule Take 1 capsule (10 mg total) by mouth daily.  Marland Kitchen FLUoxetine (PROZAC) 20 MG capsule Take 1 capsule (20 mg total) by mouth daily.  Marland Kitchen levothyroxine (SYNTHROID, LEVOTHROID) 50 MCG tablet TAKE 1 TABLET (50 MCG TOTAL) BY MOUTH DAILY BEFORE BREAKFAST.  Marland Kitchen LORazepam (ATIVAN) 0.5 MG tablet TAKE 1/2-1 TABLET BY MOUTH EVERY 8 HOURS AS NEEDED FOR ANXIETY  . pantoprazole (PROTONIX) 40 MG tablet Take 1 tablet (40 mg total) by mouth 2 (two) times daily before a meal. (Patient taking differently: Take 40 mg by mouth daily. )  . [DISCONTINUED] hydrOXYzine (ATARAX/VISTARIL) 10 MG tablet Take 1 tablet by mouth as needed  for bladder spasms   No facility-administered encounter medications on file as of 06/21/2017.      REVIEW OF SYSTEMS  : All other systems reviewed and negative except where noted in the History of Present Illness.   PHYSICAL EXAM: BP 110/70   Pulse 68   Ht 5\' 2"  (1.575 m)   Wt 146 lb 9.6 oz (66.5 kg)   LMP 06/16/2017 (Approximate)   BMI 26.81 kg/m  General: Well developed white female in no acute distress Head: Normocephalic and atraumatic Eyes:  Sclerae anicteric, conjunctiva pink. Ears: Normal auditory acuity  Lungs: Clear throughout to  auscultation; no increased WOB. Heart: Regular rate and rhythm; no M/R/G. Abdomen: Soft, non-distended.  BS present.  Non-tender. Musculoskeletal: Symmetrical with no gross deformities  Skin: No lesions on visible extremities Extremities: No edema  Neurological: Alert oriented x 4, grossly non-focal Psychological:  Alert and cooperative. Normal mood and affect  ASSESSMENT AND PLAN: *IBS:  Reports GI issues since about 27 years old with reportedly negative colonoscopy at that time.  Just notices sporadic diarrhea with certain foods, but completely normal as long as she avoids those.  I think that she may have a component of lactose intolerance as well.  There are no alarm symptoms.  Previous celiac testing negative.  Will just manage with diet for now and can use Imodium prn.  I offered levsin as well to use prn, which she reports using in the past, but she declined for now.  Can call back for prescription if she changes her mind.     CC:  Hoyt Koch, *   Addendum: Reviewed and agree with management. Pyrtle, Lajuan Lines, MD

## 2017-07-02 ENCOUNTER — Telehealth: Payer: 59 | Admitting: Nurse Practitioner

## 2017-07-02 DIAGNOSIS — B373 Candidiasis of vulva and vagina: Secondary | ICD-10-CM | POA: Diagnosis not present

## 2017-07-02 DIAGNOSIS — B3731 Acute candidiasis of vulva and vagina: Secondary | ICD-10-CM

## 2017-07-02 MED ORDER — FLUCONAZOLE 150 MG PO TABS: 150.0000 mg | ORAL_TABLET | Freq: Once | ORAL | 0 refills | Status: AC

## 2017-07-02 NOTE — Progress Notes (Signed)

## 2017-07-06 ENCOUNTER — Other Ambulatory Visit: Payer: Self-pay | Admitting: Internal Medicine

## 2017-07-06 DIAGNOSIS — R768 Other specified abnormal immunological findings in serum: Secondary | ICD-10-CM | POA: Diagnosis not present

## 2017-07-06 DIAGNOSIS — Z6826 Body mass index (BMI) 26.0-26.9, adult: Secondary | ICD-10-CM | POA: Diagnosis not present

## 2017-07-06 DIAGNOSIS — M3500 Sicca syndrome, unspecified: Secondary | ICD-10-CM | POA: Diagnosis not present

## 2017-07-06 DIAGNOSIS — E663 Overweight: Secondary | ICD-10-CM | POA: Diagnosis not present

## 2017-07-23 ENCOUNTER — Telehealth: Payer: 59 | Admitting: Nurse Practitioner

## 2017-07-23 DIAGNOSIS — B9689 Other specified bacterial agents as the cause of diseases classified elsewhere: Secondary | ICD-10-CM | POA: Diagnosis not present

## 2017-07-23 DIAGNOSIS — N760 Acute vaginitis: Secondary | ICD-10-CM

## 2017-07-23 MED ORDER — METRONIDAZOLE 500 MG PO TABS: 500.0000 mg | ORAL_TABLET | Freq: Two times a day (BID) | ORAL | 0 refills | Status: DC

## 2017-07-23 NOTE — Progress Notes (Signed)

## 2017-08-02 ENCOUNTER — Telehealth: Payer: 59 | Admitting: Family

## 2017-08-02 DIAGNOSIS — B373 Candidiasis of vulva and vagina: Secondary | ICD-10-CM | POA: Diagnosis not present

## 2017-08-02 DIAGNOSIS — B3731 Acute candidiasis of vulva and vagina: Secondary | ICD-10-CM

## 2017-08-02 MED ORDER — FLUCONAZOLE 150 MG PO TABS: 150.0000 mg | ORAL_TABLET | Freq: Once | ORAL | 0 refills | Status: AC

## 2017-08-02 NOTE — Progress Notes (Signed)
Thank you for the details you included in the comment boxes. Those details are very helpful in determining the best course of treatment for you and help [place] to provide the best care. I don't see any red flags with any of the products you are using. Everything sounds good. See plan below.  We are sorry that you are not feeling well. Here is how we plan to help! Based on what you shared with me it looks like you: May have a yeast vaginosis  Vaginosis is an inflammation of the vagina that can result in discharge, itching and pain. The cause is usually a change in the normal balance of vaginal bacteria or an infection. Vaginosis can also result from reduced estrogen levels after menopause.  The most common causes of vaginosis are:   Bacterial vaginosis which results from an overgrowth of one on several organisms that are normally present in your vagina.   Yeast infections which are caused by a naturally occurring fungus called candida.   Vaginal atrophy (atrophic vaginosis) which results from the thinning of the vagina from reduced estrogen levels after menopause.   Trichomoniasis which is caused by a parasite and is commonly transmitted by sexual intercourse.  Factors that increase your risk of developing vaginosis include: Marland Kitchen Medications, such as antibiotics and steroids . Uncontrolled diabetes . Use of hygiene products such as bubble bath, vaginal spray or vaginal deodorant . Douching . Wearing damp or tight-fitting clothing . Using an intrauterine device (IUD) for birth control . Hormonal changes, such as those associated with pregnancy, birth control pills or menopause . Sexual activity . Having a sexually transmitted infection  Your treatment plan is A single Diflucan (fluconazole) 150mg  tablet once.  I have electronically sent this prescription into the pharmacy that you have chosen.  Be sure to take all of the medication as directed. Stop taking any medication if you develop a rash,  tongue swelling or shortness of breath. Mothers who are breast feeding should consider pumping and discarding their breast milk while on these antibiotics. However, there is no consensus that infant exposure at these doses would be harmful.  Remember that medication creams can weaken latex condoms. Marland Kitchen   HOME CARE:  Good hygiene may prevent some types of vaginosis from recurring and may relieve some symptoms:  . Avoid baths, hot tubs and whirlpool spas. Rinse soap from your outer genital area after a shower, and dry the area well to prevent irritation. Don't use scented or harsh soaps, such as those with deodorant or antibacterial action. Marland Kitchen Avoid irritants. These include scented tampons and pads. . Wipe from front to back after using the toilet. Doing so avoids spreading fecal bacteria to your vagina.  Other things that may help prevent vaginosis include:  Marland Kitchen Don't douche. Your vagina doesn't require cleansing other than normal bathing. Repetitive douching disrupts the normal organisms that reside in the vagina and can actually increase your risk of vaginal infection. Douching won't clear up a vaginal infection. . Use a latex condom. Both female and female latex condoms may help you avoid infections spread by sexual contact. . Wear cotton underwear. Also wear pantyhose with a cotton crotch. If you feel comfortable without it, skip wearing underwear to bed. Yeast thrives in [employer] Your symptoms should improve in the next day or two.  GET HELP RIGHT AWAY IF:  . You have pain in your lower abdomen ( pelvic area or over your ovaries) . You develop nausea or vomiting . You  develop a fever . Your discharge changes or worsens . You have persistent pain with intercourse . You develop shortness of breath, a rapid pulse, or you faint.  These symptoms could be signs of problems or infections that need to be evaluated by a medical provider now.  MAKE SURE YOU    Understand these  instructions.  Will watch your condition.  Will get help right away if you are not doing well or get worse.  Your e-visit answers were reviewed by a board certified advanced clinical practitioner to complete your personal care plan. Depending upon the condition, your plan could have included both over the counter or prescription medications. Please review your pharmacy choice to make sure that you have choses a pharmacy that is open for you to pick up any needed prescription, Your safety is important to [place]. If you have drug allergies check your prescription carefully.   You can use MyChart to ask questions about today's visit, request a non-urgent call back, or ask for a work or school excuse for 24 hours related to this e-Visit. If it has been greater than 24 hours you will need to follow up with your provider, or enter a new e-Visit to address those concerns. You will get a MyChart message within the next two days asking about your experience. I hope that your e-visit has been valuable and will speed your recovery.

## 2017-08-14 NOTE — Progress Notes (Signed)
[name] [address], [place] [address] Phone: [phone] Subjective:    CC: Left elbow pain  [id number]  Michelle Waller is a 27 y.o. female coming in with complaint of left elbow pain. No injury noted but states she is active. No numbness and tingling. No loss of strength.   Onset- 3 weeks  Location- lateral Duration- Worse in the morning  Character- achy Aggravating factors- flexion, extension Reliving factors-nothing specific Therapies tried- topical Severity-6 out of 10     Past Medical History:  Diagnosis Date  . Eating disorder   . Frequent headaches   . GERD (gastroesophageal reflux disease)   . Kidney stones   . Kidney stones   . Ovarian cyst   . UTI (urinary tract infection)   . Vision abnormalities    Past Surgical History:  Procedure Laterality Date  . ADENOIDECTOMY    . COLONOSCOPY    . CYSTOSCOPY    . KNEE SURGERY     Social History   Socioeconomic History  . Marital status: Single    Spouse name: Not on file  . Number of children: Not on file  . Years of education: Not on file  . Highest education level: Not on file  Occupational History  . Not on file  Social Needs  . Financial resource strain: Not on file  . Food insecurity:    Worry: Not on file    Inability: Not on file  . Transportation needs:    Medical: Not on file    Non-medical: Not on file  Tobacco Use  . Smoking status: Former Smoker    Last attempt to quit: 11/09/2011    Years since quitting: 5.7  . Smokeless tobacco: Never Used  Substance and Sexual Activity  . Alcohol use: Yes    Alcohol/week: 0.0 oz    Comment: occasionally   . Drug use: No  . Sexual activity: Not on file  Lifestyle  . Physical activity:    Days per week: Not on file    Minutes per session: Not on file  . Stress: Not on file  Relationships  . Social connections:    Talks on phone: Not on file    Gets together: Not on file    Attends religious  service: Not on file    Active member of club or organization: Not on file    Attends meetings of clubs or organizations: Not on file    Relationship status: Not on file  Other Topics Concern  . Not on file  Social History Narrative  . Not on file   Allergies  Allergen Reactions  . Soy Allergy Anaphylaxis    Large amounts cause anaphylaxis Small amounts cause upset stomach  . Medrol [Methylprednisolone]     Facial swelling    Family History  Problem Relation Age of Onset  . Depression Mother   . Atrial fibrillation Mother   . Hypertension Father   . Arthritis Maternal Grandmother   . Heart disease Maternal Grandmother   . Cancer Paternal Grandmother        breast  . Hypertension Paternal Grandmother   . Heart disease Paternal Grandfather   . Hypertension Paternal Grandfather   . Diabetes Paternal Grandfather   . Diabetes Mellitus II Sister   . Hypothyroidism Sister      Past medical history, social, surgical and family history all reviewed in electronic medical record.  No pertanent information unless stated regarding to the chief  complaint.   Review of Systems:Review of systems updated and as accurate as of 08/14/17  No headache, visual changes, nausea, vomiting, diarrhea, constipation, dizziness, abdominal pain, skin rash, fevers, chills, night sweats, weight loss, swollen lymph nodes, body aches, joint swelling, muscle aches, chest pain, shortness of breath, mood changes.   Objective  There were no vitals taken for this visit. Systems examined below as of 08/14/17   General: No apparent distress alert and oriented x3 mood and affect normal, dressed appropriately.  HEENT: Pupils equal, extraocular movements intact  Respiratory: Patient's speak in full sentences and does not appear short of breath  Cardiovascular: No lower extremity edema, non tender, no erythema  Skin: Warm dry intact with no signs of infection or rash on extremities or on axial skeleton.  Abdomen:  Soft nontender  Neuro: Cranial nerves II through XII are intact, neurovascularly intact in all extremities with 2+ DTRs and 2+ pulses.  Lymph: No lymphadenopathy of posterior or anterior cervical chain or axillae bilaterally.  Gait normal with good balance and coordination.  MSK:  Non tender with full range of motion and good stability and symmetric strength and tone of shoulders,  wrist, hip, knee and ankles bilaterally.  Elbow: Left Unremarkable to inspection. Patient did have limited flexion.  Patient also limited in the last 5 degrees of supination with pain over the radial head posteriorly No discrete areas of tenderness to palpation. Ulnar nerve does not sublux. Negative cubital tunnel Tinel's.  Musculoskeletal ultrasound was performed and interpreted by Charlann Boxer D.O.   Elbow:  Patient does have more of the radial head that seems to be more subluxed posteriorly.  Annular ligament seems to be intra-articular laterally no true fracture  IMPRESSION: Subluxation of the radial head    Impression and Recommendations:     This case required medical decision making of moderate complexity.      Note: This dictation was prepared with Dragon dictation along with smaller phrase technology. Any transcriptional errors that result from this process are unintentional.

## 2017-08-16 ENCOUNTER — Encounter: Payer: Self-pay | Admitting: Family Medicine

## 2017-08-16 ENCOUNTER — Ambulatory Visit: Payer: Self-pay

## 2017-08-16 ENCOUNTER — Ambulatory Visit: Payer: 59 | Admitting: Family Medicine

## 2017-08-16 VITALS — BP 100/74 | HR 68 | Ht 62.0 in | Wt 144.0 lb

## 2017-08-16 DIAGNOSIS — S53092A Other subluxation of left radial head, initial encounter: Secondary | ICD-10-CM | POA: Insufficient documentation

## 2017-08-16 DIAGNOSIS — M25522 Pain in left elbow: Secondary | ICD-10-CM

## 2017-08-16 NOTE — Assessment & Plan Note (Signed)
Patient does have more of a subluxation of the head.  We did try to do a manipulation and did not put it again.  Discussed icing regimen and home exercises.

## 2017-08-16 NOTE — Patient Instructions (Signed)
Good to see you  Ice is your friend after working out Compression sleeve with working out.  Do not lock out the elbow and would take it easy with lifting til Monday  See me again in 6-8 weeks for the back

## 2017-08-20 ENCOUNTER — Encounter: Payer: Self-pay | Admitting: Family Medicine

## 2017-08-22 ENCOUNTER — Ambulatory Visit: Payer: Self-pay

## 2017-08-22 ENCOUNTER — Encounter: Payer: Self-pay | Admitting: Family Medicine

## 2017-08-22 ENCOUNTER — Ambulatory Visit: Payer: 59 | Admitting: Family Medicine

## 2017-08-22 ENCOUNTER — Ambulatory Visit (INDEPENDENT_AMBULATORY_CARE_PROVIDER_SITE_OTHER)
Admission: RE | Admit: 2017-08-22 | Discharge: 2017-08-22 | Disposition: A | Discharge status: Home / Self Care | Payer: 59 | Source: Ambulatory Visit | Attending: Family Medicine | Admitting: Family Medicine

## 2017-08-22 VITALS — BP 116/82 | HR 70 | Ht 62.0 in | Wt 144.0 lb

## 2017-08-22 DIAGNOSIS — M25522 Pain in left elbow: Secondary | ICD-10-CM | POA: Diagnosis not present

## 2017-08-22 DIAGNOSIS — S53092A Other subluxation of left radial head, initial encounter: Secondary | ICD-10-CM

## 2017-08-22 DIAGNOSIS — S53105A Unspecified dislocation of left ulnohumeral joint, initial encounter: Secondary | ICD-10-CM | POA: Diagnosis not present

## 2017-08-22 NOTE — Patient Instructions (Signed)
Good to see you  Michelle Waller is your friend.zice Keep up with everything else Xray downstairs See me again as scheduled

## 2017-08-22 NOTE — Progress Notes (Signed)
[name] [address], [place] [address] Phone: [phone] Subjective:    I'm seeing this patient by the request  of:    CC: Left elbow pain  [id number]  Michelle Waller is a 27 y.o. female coming in with complaint of left elbow pain. Patient feels like she may have slipped the radial headed out with pronation and supination. Pain since this weekend. Denies having any numbness or tingling in her hand. Pain today is 3/10.  Mild more pain on the posterior aspect of the elbow.      Past Medical History:  Diagnosis Date  . Eating disorder   . Frequent headaches   . GERD (gastroesophageal reflux disease)   . Kidney stones   . Kidney stones   . Ovarian cyst   . UTI (urinary tract infection)   . Vision abnormalities    Past Surgical History:  Procedure Laterality Date  . ADENOIDECTOMY    . COLONOSCOPY    . CYSTOSCOPY    . KNEE SURGERY     Social History   Socioeconomic History  . Marital status: Single    Spouse name: Not on file  . Number of children: Not on file  . Years of education: Not on file  . Highest education level: Not on file  Occupational History  . Not on file  Social Needs  . Financial resource strain: Not on file  . Food insecurity:    Worry: Not on file    Inability: Not on file  . Transportation needs:    Medical: Not on file    Non-medical: Not on file  Tobacco Use  . Smoking status: Former Smoker    Last attempt to quit: 11/09/2011    Years since quitting: 5.7  . Smokeless tobacco: Never Used  Substance and Sexual Activity  . Alcohol use: Yes    Alcohol/week: 0.0 oz    Comment: occasionally   . Drug use: No  . Sexual activity: Not on file  Lifestyle  . Physical activity:    Days per week: Not on file    Minutes per session: Not on file  . Stress: Not on file  Relationships  . Social connections:    Talks on phone: Not on file    Gets together: Not on file    Attends religious service: Not  on file    Active member of club or organization: Not on file    Attends meetings of clubs or organizations: Not on file    Relationship status: Not on file  Other Topics Concern  . Not on file  Social History Narrative  . Not on file   Allergies  Allergen Reactions  . Soy Allergy Anaphylaxis    Large amounts cause anaphylaxis Small amounts cause upset stomach  . Medrol [Methylprednisolone]     Facial swelling    Family History  Problem Relation Age of Onset  . Depression Mother   . Atrial fibrillation Mother   . Hypertension Father   . Arthritis Maternal Grandmother   . Heart disease Maternal Grandmother   . Cancer Paternal Grandmother        breast  . Hypertension Paternal Grandmother   . Heart disease Paternal Grandfather   . Hypertension Paternal Grandfather   . Diabetes Paternal Grandfather   . Diabetes Mellitus II Sister   . Hypothyroidism Sister      Past medical history, social, surgical and family history all reviewed in electronic medical record.  No pertanent information unless stated regarding to the chief complaint.   Review of Systems:Review of systems updated and as accurate as of 08/22/17  No headache, visual changes, nausea, vomiting, diarrhea, constipation, dizziness, abdominal pain, skin rash, fevers, chills, night sweats, weight loss, swollen lymph nodes, body aches, joint swelling, muscle aches, chest pain, shortness of breath, mood changes.   Objective  Blood pressure 116/82, pulse 70, height 5\' 2"  (1.575 m), weight 144 lb (65.3 kg), last menstrual period 08/15/2017, SpO2 98 %. Systems examined below as of 08/22/17   General: No apparent distress alert and oriented x3 mood and affect normal, dressed appropriately.  HEENT: Pupils equal, extraocular movements intact  Respiratory: Patient's speak in full sentences and does not appear short of breath  Cardiovascular: No lower extremity edema, non tender, no erythema  Skin: Warm dry intact with no  signs of infection or rash on extremities or on axial skeleton.  Abdomen: Soft nontender  Neuro: Cranial nerves II through XII are intact, neurovascularly intact in all extremities with 2+ DTRs and 2+ pulses.  Lymph: No lymphadenopathy of posterior or anterior cervical chain or axillae bilaterally.  Gait normal with good balance and coordination.  MSK:  Non tender with full range of motion and good stability and symmetric strength and tone of shoulders,  wrist, hip, knee and ankles bilaterally.  Elbow: Left Unremarkable to inspection. Range of motion full pronation, supination, flexion, extension.  Mild pain and full supination Strength is full to all of the above directions Stable to varus, valgus stress. Negative moving valgus stress test. Mild tenderness of the radial head Ulnar nerve does not sublux. Negative cubital tunnel Tinel's. Contralateral elbow unremarkable   Impression and Recommendations:     This case required medical decision making of moderate complexity.      Note: This dictation was prepared with Dragon dictation along with smaller phrase technology. Any transcriptional errors that result from this process are unintentional.

## 2017-08-22 NOTE — Assessment & Plan Note (Signed)
Recurrent.  Discussed about immobilization.  Patient did have full range of motion.  Discussed icing regimen, avoid lifting.  Worsening symptoms will need to consider further evaluation.  X-rays ordered today for referral to make sure no other bony abnormality could be contributing.

## 2017-09-06 ENCOUNTER — Other Ambulatory Visit: Payer: Self-pay | Admitting: Gastroenterology

## 2017-09-11 ENCOUNTER — Ambulatory Visit: Payer: 59 | Admitting: Family Medicine

## 2017-09-26 DIAGNOSIS — M25522 Pain in left elbow: Secondary | ICD-10-CM | POA: Diagnosis not present

## 2017-09-30 NOTE — Progress Notes (Signed)
[facility] [address] [place], [place] [address] Phone: [phone] Subjective:    I Michelle Waller am serving as a Education administrator for [name].     CC: Elbow pain follow-up, back pain follow-up  [id number]:[id number]  Michelle Waller is a 27 y.o. female coming in with complaint of left elbow pain. States that she worked out this morning. Elbow is doing better.  Started working out again.  Feels that this is been helpful.  Has noticed not any worsening pain with working out.   Patient has been seen for slipped rib syndrome previously as well.  States that some tightness.  Nothing severe though.  Patient has been working out and noticing more muscle soreness.    Past Medical History:  Diagnosis Date  . Eating disorder   . Frequent headaches   . GERD (gastroesophageal reflux disease)   . Kidney stones   . Kidney stones   . Ovarian cyst   . UTI (urinary tract infection)   . Vision abnormalities    Past Surgical History:  Procedure Laterality Date  . ADENOIDECTOMY    . COLONOSCOPY    . CYSTOSCOPY    . KNEE SURGERY     Social History   Socioeconomic History  . Marital status: Single    Spouse name: Not on file  . Number of children: Not on file  . Years of education: Not on file  . Highest education level: Not on file  Occupational History  . Not on file  Social Needs  . Financial resource strain: Not on file  . Food insecurity:    Worry: Not on file    Inability: Not on file  . Transportation needs:    Medical: Not on file    Non-medical: Not on file  Tobacco Use  . Smoking status: Former Smoker    Last attempt to quit: 11/09/2011    Years since quitting: 5.9  . Smokeless tobacco: Never Used  Substance and Sexual Activity  . Alcohol use: Yes    Alcohol/week: 0.0 standard drinks    Comment: occasionally   . Drug use: No  . Sexual activity: Not on file  Lifestyle  . Physical activity:    Days per week: Not on file    Minutes per  session: Not on file  . Stress: Not on file  Relationships  . Social connections:    Talks on phone: Not on file    Gets together: Not on file    Attends religious service: Not on file    Active member of club or organization: Not on file    Attends meetings of clubs or organizations: Not on file    Relationship status: Not on file  Other Topics Concern  . Not on file  Social History Narrative  . Not on file   Allergies  Allergen Reactions  . Soy Allergy Anaphylaxis    Large amounts cause anaphylaxis Small amounts cause upset stomach  . Medrol [Methylprednisolone]     Facial swelling    Family History  Problem Relation Age of Onset  . Depression Mother   . Atrial fibrillation Mother   . Hypertension Father   . Arthritis Maternal Grandmother   . Heart disease Maternal Grandmother   . Cancer Paternal Grandmother        breast  . Hypertension Paternal Grandmother   . Heart disease Paternal Grandfather   . Hypertension Paternal Grandfather   . Diabetes Paternal Grandfather   .  Diabetes Mellitus II Sister   . Hypothyroidism Sister     Current Outpatient Medications (Endocrine & Metabolic):  .  levothyroxine (SYNTHROID, LEVOTHROID) 50 MCG tablet, TAKE 1 TABLET (50 MCG TOTAL) BY MOUTH DAILY BEFORE BREAKFAST.  Current Outpatient Medications (Cardiovascular):  Marland Kitchen  EPIPEN 2-PAK 0.3 MG/0.3ML SOAJ injection, USE SUBCUTANEOUSLY AS DIRECTED  Current Outpatient Medications (Respiratory):  .  albuterol (PROVENTIL HFA;VENTOLIN HFA) 108 (90 Base) MCG/ACT inhaler, Inhale 2 puffs into the lungs every 4 (four) hours as needed for wheezing or shortness of breath.    Current Outpatient Medications (Other):  Marland Kitchen  FLUoxetine (PROZAC) 10 MG capsule, Take 1 capsule (10 mg total) by mouth daily. Marland Kitchen  FLUoxetine (PROZAC) 20 MG capsule, Take 1 capsule (20 mg total) by mouth daily. Marland Kitchen  LORazepam (ATIVAN) 0.5 MG tablet, TAKE 1/2-1 TABLET BY MOUTH EVERY 8 HOURS AS NEEDED FOR ANXIETY .  metroNIDAZOLE  (FLAGYL) 500 MG tablet, Take 1 tablet (500 mg total) by mouth 2 (two) times daily. .  pantoprazole (PROTONIX) 40 MG tablet, TAKE 1 TABLET BY MOUTH 2 TIMES DAILY BEFORE A MEAL.    Past medical history, social, surgical and family history all reviewed in electronic medical record.  No pertanent information unless stated regarding to the chief complaint.   Review of Systems:  No headache, visual changes, nausea, vomiting, diarrhea, constipation, dizziness, abdominal pain, skin rash, fevers, chills, night sweats, weight loss, swollen lymph nodes, body aches, joint swelling, muscle aches, chest pain, shortness of breath, mood changes.   Objective  Blood pressure 102/60, pulse 89, height 5\' 2"  (1.575 m), weight 144 lb (65.3 kg), SpO2 98 %.   General: No apparent distress alert and oriented x3 mood and affect normal, dressed appropriately.  HEENT: Pupils equal, extraocular movements intact  Respiratory: Patient's speak in full sentences and does not appear short of breath  Cardiovascular: No lower extremity edema, non tender, no erythema  Skin: Warm dry intact with no signs of infection or rash on extremities or on axial skeleton.  Abdomen: Soft nontender  Neuro: Cranial nerves II through XII are intact, neurovascularly intact in all extremities with 2+ DTRs and 2+ pulses.  Lymph: No lymphadenopathy of posterior or anterior cervical chain or axillae bilaterally.  Gait normal with good balance and coordination.  MSK:  Non tender with full range of motion and good stability and symmetric strength and tone of shoulders, elbows, wrist, hip, knee and ankles bilaterally.  Back Exam:  Inspection: Mild loss of lordosis Motion: Flexion 45 deg, Extension 45 deg, Side Bending to 35 deg bilaterally,  Rotation to 35 deg bilaterally  SLR laying: Negative  XSLR laying: Negative  Palpable tenderness: Tender to palpation the paraspinal musculature lumbar spine right greater than left. FABER: Mild positive  Corky Sox on the left. Sensory change: Gross sensation intact to all lumbar and sacral dermatomes.  Reflexes: 2+ at both patellar tendons, 2+ at achilles tendons, Babinski's downgoing.  Strength at foot  Plantar-flexion: 5/5 Dorsi-flexion: 5/5 Eversion: 5/5 Inversion: 5/5  Leg strength  Quad: 5/5 Hamstring: 5/5 Hip flexor: 5/5 Hip abductors: 5/5  Gait unremarkable.  Osteopathic findings C2 flexed rotated and side bent right C4 flexed rotated and side bent left C6 flexed rotated and side bent left T3 extended rotated and side bent right inhaled third rib T9 extended rotated and side bent left L3 flexed rotated and side bent left  Sacrum right on right    Impression and Recommendations:     This case required medical decision making of  moderate complexity. The above documentation has been reviewed and is accurate and complete Lyndal Pulley, DO       Note: This dictation was prepared with Dragon dictation along with smaller phrase technology. Any transcriptional errors that result from this process are unintentional.

## 2017-10-02 ENCOUNTER — Ambulatory Visit (INDEPENDENT_AMBULATORY_CARE_PROVIDER_SITE_OTHER): Payer: 59 | Admitting: Family Medicine

## 2017-10-02 ENCOUNTER — Encounter: Payer: Self-pay | Admitting: Family Medicine

## 2017-10-02 VITALS — BP 102/60 | HR 89 | Ht 62.0 in | Wt 144.0 lb

## 2017-10-02 DIAGNOSIS — M9908 Segmental and somatic dysfunction of rib cage: Secondary | ICD-10-CM

## 2017-10-02 DIAGNOSIS — M940 Chondrocostal junction syndrome [Tietze]: Secondary | ICD-10-CM | POA: Diagnosis not present

## 2017-10-02 DIAGNOSIS — M9901 Segmental and somatic dysfunction of cervical region: Secondary | ICD-10-CM

## 2017-10-02 DIAGNOSIS — M9902 Segmental and somatic dysfunction of thoracic region: Secondary | ICD-10-CM | POA: Diagnosis not present

## 2017-10-02 DIAGNOSIS — M9904 Segmental and somatic dysfunction of sacral region: Secondary | ICD-10-CM

## 2017-10-02 DIAGNOSIS — M9903 Segmental and somatic dysfunction of lumbar region: Secondary | ICD-10-CM

## 2017-10-02 DIAGNOSIS — M999 Biomechanical lesion, unspecified: Secondary | ICD-10-CM

## 2017-10-02 NOTE — Patient Instructions (Signed)
Good to see you  Ice is your friend Limit the range of motion just a bit on the lifting.  Ok to go heavy  Expect discomfort but not pain  See me again In 1-2 months!

## 2017-10-02 NOTE — Assessment & Plan Note (Signed)
Stable.  Patient will be increasing her activity.  Will need to see me in 4 to 8-week intervals.  Discussed posture and ergonomics proper lifting mechanics, patient wants to avoid a lot of medications.  Follow-up with me again in 4 weeks

## 2017-10-02 NOTE — Assessment & Plan Note (Signed)
Decision today to treat with OMT was based on Physical Exam  After verbal consent patient was treated with HVLA, ME, FPR techniques in cervical, thoracic, rib,  lumbar and sacral areas  Patient tolerated the procedure well with improvement in symptoms  Patient given exercises, stretches and lifestyle modifications  See medications in patient instructions if given  Patient will follow up in 4-8 weeks 

## 2017-10-04 ENCOUNTER — Other Ambulatory Visit: Payer: Self-pay | Admitting: Internal Medicine

## 2017-10-10 DIAGNOSIS — Z113 Encounter for screening for infections with a predominantly sexual mode of transmission: Secondary | ICD-10-CM | POA: Diagnosis not present

## 2017-10-10 DIAGNOSIS — Z01419 Encounter for gynecological examination (general) (routine) without abnormal findings: Secondary | ICD-10-CM | POA: Diagnosis not present

## 2017-10-11 LAB — HM PAP SMEAR

## 2017-11-05 ENCOUNTER — Other Ambulatory Visit: Payer: Self-pay | Admitting: Internal Medicine

## 2017-11-05 NOTE — Telephone Encounter (Signed)
Control database checked last refill: 04/05/2016 LOV; 06/13/2017 CPE NOV: None

## 2017-11-06 MED ORDER — LORAZEPAM 0.5 MG PO TABS: 0.5000 mg | ORAL_TABLET | Freq: Every day | ORAL | 0 refills | Status: DC | PRN

## 2017-11-06 NOTE — Addendum Note (Signed)
Addended by: Pricilla Holm A on: 11/06/2017 04:08 PM   Modules accepted: Orders

## 2017-11-18 NOTE — Progress Notes (Signed)
[facility] [place], Michelle Waller [address] Phone: [phone] Subjective:    I Michelle Waller am serving as a Education administrator for [name].    CC: Back pain follow-up  [id number]  Michelle Waller is a 27 y.o. female coming in with complaint of back pain. States the left side of her back needs an adjustment.  Overall has been doing relatively well.  Started workout on a more regular basis again.  Doing heavy lifting.  Is in the process of moving and going to be doing a lot more lifting.  Feels just a sore aching sensation with no radicular symptoms.     Past Medical History:  Diagnosis Date  . Eating disorder   . Frequent headaches   . GERD (gastroesophageal reflux disease)   . Kidney stones   . Kidney stones   . Ovarian cyst   . UTI (urinary tract infection)   . Vision abnormalities    Past Surgical History:  Procedure Laterality Date  . ADENOIDECTOMY    . COLONOSCOPY    . CYSTOSCOPY    . KNEE SURGERY     Social History   Socioeconomic History  . Marital status: Single    Spouse name: Not on file  . Number of children: Not on file  . Years of education: Not on file  . Highest education level: Not on file  Occupational History  . Not on file  Social Needs  . Financial resource strain: Not on file  . Food insecurity:    Worry: Not on file    Inability: Not on file  . Transportation needs:    Medical: Not on file    Non-medical: Not on file  Tobacco Use  . Smoking status: Former Smoker    Last attempt to quit: 11/09/2011    Years since quitting: 6.0  . Smokeless tobacco: Never Used  Substance and Sexual Activity  . Alcohol use: Yes    Alcohol/week: 0.0 standard drinks    Comment: occasionally   . Drug use: No  . Sexual activity: Not on file  Lifestyle  . Physical activity:    Days per week: Not on file    Minutes per session: Not on file  . Stress: Not on file  Relationships  . Social connections:    Talks on  phone: Not on file    Gets together: Not on file    Attends religious service: Not on file    Active member of club or organization: Not on file    Attends meetings of clubs or organizations: Not on file    Relationship status: Not on file  Other Topics Concern  . Not on file  Social History Narrative  . Not on file   Allergies  Allergen Reactions  . Soy Allergy Anaphylaxis    Large amounts cause anaphylaxis Small amounts cause upset stomach  . Medrol [Methylprednisolone]     Facial swelling    Family History  Problem Relation Age of Onset  . Depression Mother   . Atrial fibrillation Mother   . Hypertension Father   . Arthritis Maternal Grandmother   . Heart disease Maternal Grandmother   . Cancer Paternal Grandmother        breast  . Hypertension Paternal Grandmother   . Heart disease Paternal Grandfather   . Hypertension Paternal Grandfather   . Diabetes Paternal Grandfather   . Diabetes Mellitus II Sister   . Hypothyroidism Sister  Current Outpatient Medications (Endocrine & Metabolic):  .  levothyroxine (SYNTHROID, LEVOTHROID) 50 MCG tablet, TAKE 1 TABLET BY MOUTH DAILY BEFORE BREAKFAST.  Current Outpatient Medications (Cardiovascular):  Marland Kitchen  EPIPEN 2-PAK 0.3 MG/0.3ML SOAJ injection, USE SUBCUTANEOUSLY AS DIRECTED  Current Outpatient Medications (Respiratory):  .  albuterol (PROVENTIL HFA;VENTOLIN HFA) 108 (90 Base) MCG/ACT inhaler, Inhale 2 puffs into the lungs every 4 (four) hours as needed for wheezing or shortness of breath.    Current Outpatient Medications (Other):  Marland Kitchen  FLUoxetine (PROZAC) 10 MG capsule, Take 1 capsule (10 mg total) by mouth daily. Marland Kitchen  FLUoxetine (PROZAC) 20 MG capsule, Take 1 capsule (20 mg total) by mouth daily. Marland Kitchen  LORazepam (ATIVAN) 0.5 MG tablet, Take 1 tablet (0.5 mg total) by mouth daily as needed for anxiety. .  metroNIDAZOLE (FLAGYL) 500 MG tablet, Take 1 tablet (500 mg total) by mouth 2 (two) times daily. .  pantoprazole  (PROTONIX) 40 MG tablet, TAKE 1 TABLET BY MOUTH 2 TIMES DAILY BEFORE A MEAL.    Past medical history, social, surgical and family history all reviewed in electronic medical record.  No pertanent information unless stated regarding to the chief complaint.   Review of Systems:  No headache, visual changes, nausea, vomiting, diarrhea, constipation, dizziness, abdominal pain, skin rash, fevers, chills, night sweats, weight loss, swollen lymph nodes, body aches, joint swelling, muscle aches, chest pain, shortness of breath, mood changes.   Objective  There were no vitals taken for this visit. Systems examined below as of    General: No apparent distress alert and oriented x3 mood and affect normal, dressed appropriately.  HEENT: Pupils equal, extraocular movements intact  Respiratory: Patient's speak in full sentences and does not appear short of breath  Cardiovascular: No lower extremity edema, non tender, no erythema  Skin: Warm dry intact with no signs of infection or rash on extremities or on axial skeleton.  Abdomen: Soft nontender  Neuro: Cranial nerves II through XII are intact, neurovascularly intact in all extremities with 2+ DTRs and 2+ pulses.  Lymph: No lymphadenopathy of posterior or anterior cervical chain or axillae bilaterally.  Gait normal with good balance and coordination.  MSK:  Non tender with full range of motion and good stability and symmetric strength and tone of shoulders, elbows, wrist, hip, knee and ankles bilaterally.  Neck: Inspection unremarkable. No palpable stepoffs. Negative Spurling's maneuver. Full neck range of motion Grip strength and sensation normal in bilateral hands Strength good C4 to T1 distribution No sensory change to C4 to T1 Negative Hoffman sign bilaterally Reflexes normal Tightness in the left trapezius area as well as tightness in the parascapular musculature especially when T3-T5 on the left  Osteopathic findings C2 flexed rotated and  side bent right T3 extended rotated and side bent left inhaled third rib T5 extended rotated and side bent right  L2 flexed rotated and side bent right Sacrum right on right     Impression and Recommendations:     This case required medical decision making of moderate complexity. The above documentation has been reviewed and is accurate and complete Lyndal Pulley, DO       Note: This dictation was prepared with Dragon dictation along with smaller phrase technology. Any transcriptional errors that result from this process are unintentional.

## 2017-11-19 ENCOUNTER — Ambulatory Visit (INDEPENDENT_AMBULATORY_CARE_PROVIDER_SITE_OTHER): Payer: 59 | Admitting: Family Medicine

## 2017-11-19 ENCOUNTER — Encounter: Payer: Self-pay | Admitting: Family Medicine

## 2017-11-19 VITALS — BP 100/60 | HR 74 | Ht 62.0 in | Wt 147.0 lb

## 2017-11-19 DIAGNOSIS — M9902 Segmental and somatic dysfunction of thoracic region: Secondary | ICD-10-CM | POA: Diagnosis not present

## 2017-11-19 DIAGNOSIS — M999 Biomechanical lesion, unspecified: Secondary | ICD-10-CM

## 2017-11-19 DIAGNOSIS — M9903 Segmental and somatic dysfunction of lumbar region: Secondary | ICD-10-CM | POA: Diagnosis not present

## 2017-11-19 DIAGNOSIS — M9901 Segmental and somatic dysfunction of cervical region: Secondary | ICD-10-CM

## 2017-11-19 DIAGNOSIS — M9908 Segmental and somatic dysfunction of rib cage: Secondary | ICD-10-CM

## 2017-11-19 DIAGNOSIS — M940 Chondrocostal junction syndrome [Tietze]: Secondary | ICD-10-CM

## 2017-11-19 DIAGNOSIS — M9904 Segmental and somatic dysfunction of sacral region: Secondary | ICD-10-CM

## 2017-11-19 MED ORDER — ALBUTEROL SULFATE HFA 108 (90 BASE) MCG/ACT IN AERS: 2.0000 | INHALATION_SPRAY | RESPIRATORY_TRACT | 6 refills | Status: DC | PRN

## 2017-11-19 NOTE — Assessment & Plan Note (Signed)
Decision today to treat with OMT was based on Physical Exam  After verbal consent patient was treated with HVLA, ME, FPR techniques in cervical, thoracic, rib,  lumbar and sacral areas  Patient tolerated the procedure well with improvement in symptoms  Patient given exercises, stretches and lifestyle modifications  See medications in patient instructions if given  Patient will follow up in 4-8 weeks 

## 2017-11-19 NOTE — Patient Instructions (Addendum)
Great to see you  1-2 months

## 2017-11-19 NOTE — Assessment & Plan Note (Signed)
Patient has had more of a slipped rib syndrome.  Discussed icing regimen and home exercises.  Discussed which activities to do which wants to avoid.  Increase activity slowly over the course the next several days again.  Patient encouraged to monitor the lifting mechanics.  Discussed which activities to avoid.  Follow-up again in 4 to 8 weeks

## 2017-12-03 ENCOUNTER — Ambulatory Visit: Payer: 59 | Admitting: Internal Medicine

## 2017-12-03 ENCOUNTER — Encounter: Payer: Self-pay | Admitting: Internal Medicine

## 2017-12-03 ENCOUNTER — Telehealth: Payer: 59 | Admitting: Family

## 2017-12-03 DIAGNOSIS — H9202 Otalgia, left ear: Secondary | ICD-10-CM | POA: Diagnosis not present

## 2017-12-03 DIAGNOSIS — H538 Other visual disturbances: Secondary | ICD-10-CM

## 2017-12-03 DIAGNOSIS — H9209 Otalgia, unspecified ear: Secondary | ICD-10-CM

## 2017-12-03 MED ORDER — NEOMYCIN-POLYMYXIN-HC 3.5-10000-1 OT SUSP: 3.0000 [drp] | Freq: Three times a day (TID) | OTIC | 0 refills | Status: DC

## 2017-12-03 NOTE — Progress Notes (Signed)
   Subjective:    Patient ID: Michelle Waller, female    DOB: 03-Nov-1990, 27 y.o.   MRN: [id number]  HPI The patient is a 27 YO female coming in for ear pain on the left. Denies other sinus symptoms. Mild dry cough but not consistent. She denies facial or sinus pain. Denies much nasal congestion or drainage. Denies fevers or chills. Going on for 4-5 days. Did clean out the ears toward the beginning of symptoms and this did not help. Right ear is normal feeling. Some echoing and can feel crackling with moving jaw and ear. Overall worsening gradually.   Review of Systems  Constitutional: Negative for activity change, appetite change, chills, fatigue, fever and unexpected weight change.  HENT: Positive for ear pain. Negative for congestion, ear discharge, postnasal drip, rhinorrhea, sinus pressure, sinus pain, sneezing, sore throat, tinnitus, trouble swallowing and voice change.   Eyes: Negative.   Respiratory: Positive for cough. Negative for chest tightness, shortness of breath and wheezing.   Cardiovascular: Negative.   Gastrointestinal: Negative.   Neurological: Negative.       Objective:   Physical Exam  Constitutional: She is oriented to person, place, and time. She appears well-developed and well-nourished.  HENT:  Head: Normocephalic and atraumatic.  Left otitis externa with bulging TM clear fluid, right ear normal. Nose and oropharynx normal.   Eyes: EOM are normal.  Neck: Normal range of motion.  Cardiovascular: Normal rate and regular rhythm.  Pulmonary/Chest: Effort normal and breath sounds normal. No respiratory distress. She has no wheezes. She has no rales.  Musculoskeletal: She exhibits no edema.  Neurological: She is alert and oriented to person, place, and time. Coordination normal.  Skin: Skin is warm and dry.   Vitals:   12/03/17 1520  BP: 112/78  Pulse: 83  Temp: 98.4 F (36.9 C)  TempSrc: Oral  SpO2: 99%  Weight: 137 lb (62.1 kg)  Height: 5\' 2"  (1.575 m)        Assessment & Plan:

## 2017-12-03 NOTE — Assessment & Plan Note (Signed)
Rx for cortisporin ear drops for otitis externa.

## 2017-12-03 NOTE — Progress Notes (Signed)
Based on what you shared with me it looks like you have a serious condition that should be evaluated in a face to face office visit.  NOTE: If you entered your credit card information for this eVisit, you will not be charged. You may see a "hold" on your card for the $30 but that hold will drop off and you will not have a charge processed.  If you are having a true medical emergency please call 911.  If you need an urgent face to face visit, [facility] has four urgent care centers for your convenience.  If you need care fast and have a high deductible or no insurance consider:   [email or web address] to reserve your spot online an avoid wait times  [facility] [address], [address] [place], [place] [address] 8 am to 8 pm Monday-Friday 10 am to 4 pm Saturday-Sunday *Across the street from [employer]  [facility]  [address] [place], [address] 8 am to 5 pm Monday-Friday * In the [facility]   The following sites will take your  insurance:  . [facility]  [phone] Get Driving Directions Find a Provider at this Location  [address] [place], [place] [address] . 10 am to 8 pm Monday-Friday . 12 pm to 8 pm Saturday-Sunday   . [facility]  [phone] Get Driving Directions Find a Provider at this Location  [address], [address] [place], [place] [address] . 8 am to 8 pm Monday-Friday . 9 am to 6 pm Saturday . 11 am to 6 pm Sunday   . [facility] [place]  [phone] Get Driving Directions  [address] [place], [place] [address] . 8 am to 8 pm Monday-Friday . 8 am to 4 pm Saturday-Sunday   Your e-visit answers were reviewed by a board certified advanced clinical practitioner to complete your personal care plan.  Thank you for using e-Visits.  

## 2017-12-03 NOTE — Patient Instructions (Signed)
We have sent in the ear drops to use 3 drops 3 times per day for 3 days.

## 2017-12-10 ENCOUNTER — Encounter: Payer: Self-pay | Admitting: *Deleted

## 2017-12-13 ENCOUNTER — Ambulatory Visit (INDEPENDENT_AMBULATORY_CARE_PROVIDER_SITE_OTHER): Payer: 59 | Admitting: Internal Medicine

## 2017-12-13 ENCOUNTER — Encounter: Payer: Self-pay | Admitting: Internal Medicine

## 2017-12-13 VITALS — BP 94/66 | HR 76 | Ht 62.0 in | Wt 145.0 lb

## 2017-12-13 DIAGNOSIS — K589 Irritable bowel syndrome without diarrhea: Secondary | ICD-10-CM

## 2017-12-13 DIAGNOSIS — K219 Gastro-esophageal reflux disease without esophagitis: Secondary | ICD-10-CM | POA: Diagnosis not present

## 2017-12-13 MED ORDER — PANTOPRAZOLE SODIUM 20 MG PO TBEC: 20.0000 mg | DELAYED_RELEASE_TABLET | Freq: Every day | ORAL | 0 refills | Status: DC

## 2017-12-13 MED ORDER — HYOSCYAMINE SULFATE 0.125 MG SL SUBL: 0.1250 mg | SUBLINGUAL_TABLET | Freq: Three times a day (TID) | SUBLINGUAL | 2 refills | Status: DC | PRN

## 2017-12-13 NOTE — Progress Notes (Signed)
   Subjective:    Patient ID: Michelle Waller, female    DOB: 12/27/1990, 27 y.o.   MRN: [id number]  HPI Michelle Waller is a 27 yo female with hx of GERD with atypical chest pain, IBS who is here for follow-up.  She was last seen on 06/21/2017 by Alonza Bogus, PA-C and presents alone today.  She reports that in October she developed severe diffuse abdominal crampy discomfort.  This was associated with urgent diarrhea and loose stools.  Stools were nonbloody and non-melenic.  Abdominal bloating was associated.  She was using some Pepto-Bismol at the time.  Symptoms were more severe than just cramping alone.  She reports that dating back to when she was 13 she was diagnosed with irritable bowel and have crampy spastic type pain for which she would use hyoscyamine.  Hyoscyamine worked well for her but she has been out of this medication for some time.    Since making this appointment her symptoms have improved significantly.  She is using align 1 capsule daily and this has helped.  She has been on pantoprazole 20 mg once daily and her heartburn, GERD symptoms and atypical chest pain has been well controlled.  No dysphagia or odynophagia.  She works at the [employer].  She denies recent stressors or life changing event associated with  her symptoms discussed above   Review of Systems As per HPI, otherwise negative  Current Medications, Allergies, Past Medical History, Past Surgical History, Family History and Social History were reviewed in [employer] record.     Objective:   Physical Exam BP 94/66   Pulse 76   Ht 5\' 2"  (1.575 m)   Wt 145 lb (65.8 kg)   LMP 11/28/2017   BMI 26.52 kg/m  Constitutional: Well-developed and well-nourished. No distress. HEENT: Normocephalic and atraumatic.  Conjunctivae are normal.  No scleral icterus. Neck: Neck supple. Trachea midline. Cardiovascular: Normal rate, regular rhythm and intact distal pulses. No M/R/G Pulmonary/chest:  Effort normal and breath sounds normal. No wheezing, rales or rhonchi. Abdominal: Soft, nontender, nondistended. Bowel sounds active throughout. There are no masses palpable. No hepatosplenomegaly. Extremities: no clubbing, cyanosis, or edema Neurological: Alert and oriented to person place and time. Skin: Skin is warm and dry. Psychiatric: Normal mood and affect. Behavior is normal.     Assessment & Plan:  27 yo female with hx of GERD with atypical chest pain, IBS who is here for follow-up'  1. IBS --it does sound like she had a flare of irritable bowel which lasted for several weeks.  It has improved with probiotic.  We discussed the gut micro-biome and how irritable bowel is associated.  We discussed rifaximin but given her overall improvement with probiotic we will hold off on this for now.  If symptoms recur we can treat her with rifaximin 550 mg 3 times daily x14 days.  For now continue Align 1 capsule daily and add back Levsin 0.125 mg sublingual.  She can use 1-2 every 8 hours as needed for IBS, abdominal cramping and discomfort.  She is happy with this plan  2.  GERD --doing well on low-dose PPI.  We will continue pantoprazole 20 mg daily.  Annual follow-up, sooner if necessary 25 minutes spent with the patient today. Greater than 50% was spent in counseling and coordination of care with the patient

## 2017-12-13 NOTE — Patient Instructions (Signed)
Continue Align.  We have sent the following medications to your pharmacy for you to pick up at your convenience: Lennie Odor SL 0.125 mg- 1-2 tablet every 8 hours as needed for IBS pain.  Continue pantoprazole 20 mg daily.  Follow up with Dr Hilarie Fredrickson in 1 year.  If you are age 27 or older, your body mass index should be between 23-30. Your Body mass index is 26.52 kg/m. If this is out of the aforementioned range listed, please consider follow up with your Primary Care Provider.  If you are age 28 or younger, your body mass index should be between 19-25. Your Body mass index is 26.52 kg/m. If this is out of the aformentioned range listed, please consider follow up with your Primary Care Provider.

## 2017-12-18 ENCOUNTER — Encounter: Payer: Self-pay | Admitting: Internal Medicine

## 2017-12-21 ENCOUNTER — Telehealth: Payer: 59 | Admitting: Family

## 2017-12-21 DIAGNOSIS — B9689 Other specified bacterial agents as the cause of diseases classified elsewhere: Secondary | ICD-10-CM

## 2017-12-21 DIAGNOSIS — J329 Chronic sinusitis, unspecified: Secondary | ICD-10-CM | POA: Diagnosis not present

## 2017-12-21 MED ORDER — AMOXICILLIN-POT CLAVULANATE 875-125 MG PO TABS: 1.0000 | ORAL_TABLET | Freq: Two times a day (BID) | ORAL | 0 refills | Status: AC

## 2017-12-21 NOTE — Progress Notes (Signed)

## 2017-12-31 NOTE — Progress Notes (Signed)
[facility] [name], [name] [address] Phone: [phone] Subjective:     CC: Back and neck pain follow-up  [id number]  Michelle Waller is a 27 y.o. female coming in with complaint of back and neck pain follow-up.  Patient has had known slipped rib syndrome.  Has been doing relatively well.  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  Increasing activity recently as well.  Discussed icing regimen and home exercises.  Patient has been trying to increase activity slowly.  Follow-up again in 4 to 8 weeks      Past Medical History:  Diagnosis Date  . Eating disorder   . Frequent headaches   . Gastritis   . GERD (gastroesophageal reflux disease)   . Hiatal hernia   . IBS (irritable bowel syndrome)   . Kidney stones   . Ovarian cyst   . UTI (urinary tract infection)   . Vision abnormalities    Past Surgical History:  Procedure Laterality Date  . ADENOIDECTOMY    . COLONOSCOPY    . CYSTOSCOPY    . KNEE SURGERY     Social History   Socioeconomic History  . Marital status: Single    Spouse name: Not on file  . Number of children: Not on file  . Years of education: Not on file  . Highest education level: Not on file  Occupational History  . Not on file  Social Needs  . Financial resource strain: Not on file  . Food insecurity:    Worry: Not on file    Inability: Not on file  . Transportation needs:    Medical: Not on file    Non-medical: Not on file  Tobacco Use  . Smoking status: Former Smoker    Last attempt to quit: 11/09/2011    Years since quitting: 6.1  . Smokeless tobacco: Never Used  Substance and Sexual Activity  . Alcohol use: Yes    Alcohol/week: 0.0 standard drinks    Comment: occasionally   . Drug use: No  . Sexual activity: Not on file  Lifestyle  . Physical activity:    Days per week: Not on file    Minutes per session: Not on file  . Stress: Not on file  Relationships  . Social connections:    Talks on  phone: Not on file    Gets together: Not on file    Attends religious service: Not on file    Active member of club or organization: Not on file    Attends meetings of clubs or organizations: Not on file    Relationship status: Not on file  Other Topics Concern  . Not on file  Social History Narrative  . Not on file   Allergies  Allergen Reactions  . Soy Allergy Anaphylaxis    Large amounts cause anaphylaxis Small amounts cause upset stomach  . Medrol [Methylprednisolone]     Facial swelling    Family History  Problem Relation Age of Onset  . Depression Mother   . Atrial fibrillation Mother   . Hypertension Father   . Arthritis Maternal Grandmother   . Heart disease Maternal Grandmother   . Cancer Paternal Grandmother        breast  . Hypertension Paternal Grandmother   . Heart disease Paternal Grandfather   . Hypertension Paternal Grandfather   . Diabetes Paternal Grandfather   . Diabetes Mellitus II Sister   . Hypothyroidism Sister     Current Outpatient Medications (  Endocrine & Metabolic):  .  levothyroxine (SYNTHROID, LEVOTHROID) 50 MCG tablet, TAKE 1 TABLET BY MOUTH DAILY BEFORE BREAKFAST.   Current Outpatient Medications (Respiratory):  .  albuterol (PROVENTIL HFA;VENTOLIN HFA) 108 (90 Base) MCG/ACT inhaler, Inhale 2 puffs into the lungs every 4 (four) hours as needed for wheezing or shortness of breath.    Current Outpatient Medications (Other):  Marland Kitchen  FLUoxetine (PROZAC) 10 MG capsule, Take 1 capsule (10 mg total) by mouth daily. Marland Kitchen  FLUoxetine (PROZAC) 20 MG capsule, Take 1 capsule (20 mg total) by mouth daily. .  hyoscyamine (LEVSIN SL) 0.125 MG SL tablet, Place 1 tablet (0.125 mg total) under the tongue every 8 (eight) hours as needed (IBS pain). .  LORazepam (ATIVAN) 0.5 MG tablet, Take 1 tablet (0.5 mg total) by mouth daily as needed for anxiety. .  pantoprazole (PROTONIX) 20 MG tablet, Take 1 tablet (20 mg total) by mouth daily.    Past medical  history, social, surgical and family history all reviewed in electronic medical record.  No pertanent information unless stated regarding to the chief complaint.   Review of Systems:  No headache, visual changes, nausea, vomiting, diarrhea, constipation, dizziness, abdominal pain, skin rash, fevers, chills, night sweats, weight loss, swollen lymph nodes, body aches, joint swelling, muscle aches, chest pain, shortness of breath, mood changes.   Objective  Blood pressure 124/82, pulse 65, resp. rate 16, weight 144 lb (65.3 kg), SpO2 98 %.    General: No apparent distress alert and oriented x3 mood and affect normal, dressed appropriately.  HEENT: Pupils equal, extraocular movements intact  Respiratory: Patient's speak in full sentences and does not appear short of breath  Cardiovascular: No lower extremity edema, non tender, no erythema  Skin: Warm dry intact with no signs of infection or rash on extremities or on axial skeleton.  Abdomen: Soft nontender  Neuro: Cranial nerves II through XII are intact, neurovascularly intact in all extremities with 2+ DTRs and 2+ pulses.  Lymph: No lymphadenopathy of posterior or anterior cervical chain or axillae bilaterally.  Gait normal with good balance and coordination.  MSK:  Non tender with full range of motion and good stability and symmetric strength and tone of shoulders, elbows, wrist, hip, knee and ankles bilaterally.  Patient neck exam does have some mild loss of lordosis.  Significant tightness in the trapezius area on the right side with likely flipper noted.  Some mild tightness in the parascapular region as well.  Negative Spurling's.  Full strength of the upper extremities.  Low back does have some mild tightness.  Mild positive pain over the sacroiliac joints bilaterally.  Mild positive Faber test.  Negative straight leg test.  Osteopathic findings C2 flexed rotated and side bent right C4 flexed rotated and side bent left C6 flexed rotated  and side bent left T3 extended rotated and side bent right inhaled third rib T9 extended rotated and side bent left L2 flexed rotated and side bent right Sacrum right on right     Impression and Recommendations:     This case required medical decision making of moderate complexity. The above documentation has been reviewed and is accurate and complete Lyndal Pulley, DO       Note: This dictation was prepared with Dragon dictation along with smaller phrase technology. Any transcriptional errors that result from this process are unintentional.

## 2018-01-02 ENCOUNTER — Ambulatory Visit (INDEPENDENT_AMBULATORY_CARE_PROVIDER_SITE_OTHER): Payer: 59 | Admitting: Family Medicine

## 2018-01-02 ENCOUNTER — Encounter: Payer: Self-pay | Admitting: Family Medicine

## 2018-01-02 VITALS — BP 124/82 | HR 65 | Resp 16 | Wt 144.0 lb

## 2018-01-02 DIAGNOSIS — M999 Biomechanical lesion, unspecified: Secondary | ICD-10-CM | POA: Diagnosis not present

## 2018-01-02 DIAGNOSIS — M940 Chondrocostal junction syndrome [Tietze]: Secondary | ICD-10-CM

## 2018-01-02 NOTE — Patient Instructions (Signed)
You are awesome Stay active You are doing great  Continue the vitamins congrats on the house See me again in 8 weeks

## 2018-01-02 NOTE — Assessment & Plan Note (Signed)
Stable overall.  Has been nearly 2 months since we have seen patient.  Discussed posture and ergonomics.  Discussed continuing to monitor her lifting.  Follow-up with me again in 4 to 8 weeks.

## 2018-01-02 NOTE — Assessment & Plan Note (Signed)
Decision today to treat with OMT was based on Physical Exam  After verbal consent patient was treated with HVLA, ME, FPR techniques in cervical, thoracic, rib lumbar and sacral areas  Patient tolerated the procedure well with improvement in symptoms  Patient given exercises, stretches and lifestyle modifications  See medications in patient instructions if given  Patient will follow up in 4-8 weeks 

## 2018-01-07 ENCOUNTER — Other Ambulatory Visit: Payer: Self-pay | Admitting: Internal Medicine

## 2018-01-15 ENCOUNTER — Telehealth: Payer: Self-pay | Admitting: Internal Medicine

## 2018-01-15 NOTE — Telephone Encounter (Signed)
Pt called to inform that hyoscyamine is not working and wants to know if something else can be prescribed.

## 2018-01-15 NOTE — Telephone Encounter (Signed)
The pt states that levsin is not helping her IBS pain.  She would like Dr Hilarie Fredrickson to advise what else she can take. She is aware that she will not get a call back until after Christmas.   Please advise

## 2018-01-17 MED ORDER — DICYCLOMINE HCL 20 MG PO TABS: 20.0000 mg | ORAL_TABLET | Freq: Three times a day (TID) | ORAL | 0 refills | Status: DC

## 2018-01-17 NOTE — Telephone Encounter (Signed)
Can try Bentyl 20 mg 3 times daily as needed for crampy abdominal discomfort She should continue probiotic If symptoms are worsening we can try rifaximin (i.e. Bentyl does not help and the previous progress she had made by the time of her visit with me regresses)

## 2018-01-17 NOTE — Telephone Encounter (Signed)
Spoke with pt and she is aware, script sent to pharmacy for pt. 

## 2018-01-31 NOTE — Telephone Encounter (Signed)
Please let Michelle Waller know that I received her email My recommendation would be that she consider having a colonoscopy to evaluate the colon given the symptoms that are ongoing and recurrent This would allow [place] to rule out inflammation such as colitis which for some people can cause loose stools and crampy abdominal pain If colonoscopy unremarkable then we could discuss further therapy for her symptoms

## 2018-02-07 ENCOUNTER — Encounter: Payer: Self-pay | Admitting: Internal Medicine

## 2018-02-14 ENCOUNTER — Ambulatory Visit (INDEPENDENT_AMBULATORY_CARE_PROVIDER_SITE_OTHER): Payer: Self-pay | Admitting: Physician Assistant

## 2018-02-14 ENCOUNTER — Encounter: Payer: Self-pay | Admitting: Physician Assistant

## 2018-02-14 VITALS — BP 132/80 | HR 71 | Temp 98.4°F | Resp 16 | Ht 63.0 in | Wt 145.0 lb

## 2018-02-14 DIAGNOSIS — J019 Acute sinusitis, unspecified: Secondary | ICD-10-CM

## 2018-02-14 DIAGNOSIS — G44209 Tension-type headache, unspecified, not intractable: Secondary | ICD-10-CM

## 2018-02-14 MED ORDER — DOXYCYCLINE HYCLATE 100 MG PO TABS: 100.0000 mg | ORAL_TABLET | Freq: Two times a day (BID) | ORAL | 0 refills | Status: AC

## 2018-02-14 MED ORDER — METHYLPREDNISOLONE 4 MG PO TBPK: ORAL_TABLET | ORAL | 0 refills | Status: DC

## 2018-02-14 MED ORDER — DIAZEPAM 10 MG PO TABS: 10.0000 mg | ORAL_TABLET | Freq: Three times a day (TID) | ORAL | 0 refills | Status: DC | PRN

## 2018-02-14 NOTE — Patient Instructions (Signed)
Thank you for choosing [facility] for your health care needs.  You have been diagnosed with a headache. Suspect a tension headache.  Recommend rest and relaxation today. Apply ice and/or warm compress to back of neck. Massage back of neck. Perform gentle neck stretching exercises. May apply bengay, IcyHot, or BioFreeze.  You have been prescribed Medrol Dose Pak and Diazepam for headache.  You have been prescribed Doxycycline for possible sinusitis.  Increase fluids. Use Flonase nasal spray. May take over the counter decongestant such as Sudafed.  Go directly to the ED if your headache returns, if you develop confusion, slurred speech, sensitivity to light, imbalance, vomiting, or other new/concerning symptom.  May follow-up with family physician in 4-5 days, if symptoms not improving.  Tension Headache, Adult A tension headache is pain, pressure, or aching in your head. Tension headaches can last from 30 minutes to several days. Follow these instructions at home: Managing pain  Take over-the-counter and prescription medicines only as told by your doctor.  When you have a headache, lie down in a dark, quiet room.  If told, put ice on your head and neck: ? Put ice in a plastic bag. ? Place a towel between your skin and the bag. ? Leave the ice on for 20 minutes, 2-3 times a day.  If told, put heat on the back of your neck. Do this as often as your doctor tells you to. Use the kind of heat that your doctor recommends, such as a moist heat pack or a heating pad. ? Place a towel between your skin and the heat. ? Leave the heat on for 20-30 minutes. ? Remove the heat if your skin turns bright red. Eating and drinking  Eat meals on a regular schedule.  Watch how much alcohol you drink: ? If you are a woman and are not pregnant, do not drink more than 1 drink a day. ? If you are a man, do not drink more than 2 drinks a day.  Drink enough fluid to keep your pee (urine) pale  yellow.  Do not use a lot of caffeine, or stop using caffeine. Lifestyle  Get enough sleep. Get 7-9 hours of sleep each night. Or get the amount of sleep that your doctor tells you to.  At bedtime, remove all electronic devices from your room. Examples of electronic devices are computers, phones, and tablets.  Find ways to lessen your stress. Some things that can lessen stress are: ? Exercise. ? Deep breathing. ? Yoga. ? Music. ? Positive thoughts.  Sit up straight. Do not tighten (tense) your muscles.  Do not use any products that have nicotine or tobacco in them, such as cigarettes and e-cigarettes. If you need help quitting, ask your doctor. General instructions   Keep all follow-up visits as told by your doctor. This is important.  Avoid things that can bring on headaches. Keep a journal to find out if certain things bring on headaches. For example, write down: ? What you eat and drink. ? How much sleep you get. ? Any change to your diet or medicines. Contact a doctor if:  Your headache does not get better.  Your headache comes back.  You have a headache and sounds, light, or smells bother you.  You feel sick to your stomach (nauseous) or you throw up (vomit).  Your stomach hurts. Get help right away if:  You suddenly get a very bad headache along with any of these: ? A stiff neck. ? Feeling  sick to your stomach. ? Throwing up. ? Feeling weak. ? Trouble seeing. ? Feeling short of breath. ? A rash. ? Feeling unusually sleepy. ? Trouble speaking. ? Pain in your eye or ear. ? Trouble walking or balancing. ? Feeling like you will pass out (faint). ? Passing out. Summary  A tension headache is pain, pressure, or aching in your head.  Tension headaches can last from 30 minutes to several days.  Lifestyle changes and medicines may help relieve pain. This information is not intended to replace advice given to you by your health care provider. Make sure you  discuss any questions you have with your health care provider. Document Released: 04/05/2009 Document Revised: 04/21/2016 Document Reviewed: 04/21/2016 Elsevier Interactive Patient Education  2019 [employer].

## 2018-02-14 NOTE — Progress Notes (Addendum)
Patient ID: Michelle Waller DOB: 07/11/1990 AGE: 28 y.o. MRN: [id number]   PCP: Hoyt Koch, MD   Chief Complaint:  Chief Complaint  Patient presents with  . Sinusitis    x 3d  . ear full    x 3d  . Headache    x a.m     Subjective:    HPI:  Michelle Waller is a 28 y.o. female presents for evaluation  Chief Complaint  Patient presents with  . Sinusitis    x 3d  . ear full    x 3d  . Headache    x a.m    28 year old female presents with four day history of URI symptoms. Began with sore/scratchy throat. Then developed nasal congestion, nasal bridge pressure/congestion, postnasal drip, and bilateral ear fullness/pressure with crackling/popping. Took OTC Dayquil and Sudafed with minimal relief. Denies fever, chills, ear discharge/drainage, maxillary sinus pain, chest pain, SOB, wheezing, cough.  Patient seen by PCP, Dr. Pricilla Holm MD with [facility], on 12/03/2017 with 4-5 day history of left era pain; associated crackling. Associated dry cough and nasal congestion. Diagnosed with left ear pain. Prescribed Neomycin ear drops. Minimal improvement. Performed E-Visit on 12/21/2017 for one week history of headache and ear pain. Diagnosed with sinusitis. Prescribed Augmentin.  Patient states on first day of symptoms had 7/10 headache. Located at occipital area with radiation to bilateral temporals and across forehead. Tightness/clenching sensation. Took OTC Tylenol 500mg , quickly reduced severity. Patient states then this morning, had headache that woke her from sleeping. Severe. 9/10 in severity. Same location as previous headache. Unsure if thunderclap onset. Associated imbalance sensation. No change in vision, photosensitivity, photophobia, tinnitus, nausea/vomiting, extremity weakness/paresthesias.  Not patient's most severe headache of her life. Patient with history of severe tension headaches as a child; began age [age] years old. Underwent  extensive neurological work-up. Was on Beta Blocker, however caused side effects of lightheadedness and dizziness. Patient states she grew out of tension headaches at age [age] years old. Now very rarely has headaches. Patient last seen by Dr. Arlice Colt MD with [facility] on 10/31/2016, was being treated for headache, fatigue, decreased visual acuity, and presyncope. Headaches twice a week and controlled with Tylenol and Prozac. MRI of the brain on 09/06/2016 was normal. No follow-up was scheduled, just return prn.  A limited review of symptoms was performed, pertinent positives and negatives as mentioned in HPI.  The following portions of the patient's history were reviewed and updated as appropriate: allergies, current medications and past medical history.  Patient Active Problem List   Diagnosis Date Noted  . Left ear pain 12/03/2017  . Nonallopathic lesion of lumbosacral region 10/02/2017  . Nonallopathic lesion of sacral region 10/02/2017  . Other subluxation of left radial head, initial encounter 08/16/2017  . Irritable bowel syndrome 06/21/2017  . Routine general medical examination at a health care facility 06/14/2017  . 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 Nonallopathic lesion of thoracic region 04/30/2017  . Nonallopathic lesion of rib cage 04/30/2017  . Nonallopathic lesion of cervical region 04/30/2017  . ANA positive 10/31/2016  . Other headache syndrome 08/15/2016  . Anxiety 08/15/2016  . Pre-syncope 05/31/2016  . Hypothyroidism 02/03/2016  . GERD (gastroesophageal reflux disease) 02/11/2015    Allergies  Allergen Reactions  . Soy Allergy Anaphylaxis    Large amounts cause anaphylaxis Small amounts cause upset stomach  . Medrol [Methylprednisolone]     Facial swelling     Current  Outpatient Medications on File Prior to Visit  Medication Sig Dispense Refill  . albuterol (PROVENTIL HFA;VENTOLIN HFA) 108 (90 Base) MCG/ACT inhaler Inhale 2 puffs  into the lungs every 4 (four) hours as needed for wheezing or shortness of breath. 1 each 6  . FLUoxetine (PROZAC) 10 MG capsule Take 1 capsule (10 mg total) by mouth daily. 90 capsule 3  . FLUoxetine (PROZAC) 20 MG capsule Take 1 capsule (20 mg total) by mouth daily. 90 capsule 3  . hyoscyamine (LEVSIN SL) 0.125 MG SL tablet Place 1 tablet (0.125 mg total) under the tongue every 8 (eight) hours as needed (IBS pain). 120 tablet 2  . levothyroxine (SYNTHROID, LEVOTHROID) 50 MCG tablet TAKE 1 TABLET BY MOUTH DAILY BEFORE BREAKFAST. 30 tablet 2  . LORazepam (ATIVAN) 0.5 MG tablet Take 1 tablet (0.5 mg total) by mouth daily as needed for anxiety. 30 tablet 0  . pantoprazole (PROTONIX) 20 MG tablet Take 1 tablet (20 mg total) by mouth daily. 1 tablet 0   No current facility-administered medications on file prior to visit.        Objective:   Vitals:   02/14/18 1208  BP: 132/80  Pulse: 71  Resp: 16  Temp: 98.4 F (36.9 C)  SpO2: 99%     Wt Readings from Last 3 Encounters:  02/14/18 145 lb (65.8 kg)  01/02/18 144 lb (65.3 kg)  12/13/17 145 lb (65.8 kg)    Physical Exam:   General Appearance:  Patient sitting comfortably on examination table. Conversational. [name] self-historian. In no acute distress. Rates current headache 2-3/10. Afebrile.   Head:  Normocephalic, without obvious abnormality, atraumatic. Facial symmetry.  Eyes:  PERRL, conjunctiva/corneas clear, EOM's intact. No horizontal nystagmus.  Ears:  Bilateral ear canals WNL. No erythema or edema. No discharge/drainage. Bilateral TMs WNL. No erythema or injection. Mild serous effusion bilaterally.  Nose: Nares normal, septum midline. Nasal mucosa with bilateral edema and scant clear rhinorrhea. No visible polyps. Tenderness with palpation at frontal sinus location bilaterally.  Throat: Lips, mucosa, and tongue normal; teeth and gums normal. Throat reveals no erythema. Tonsils with no enlargement or exudate.  Neck: Supple,  symmetrical, trachea midline, no adenopathy. No nuchal rigidity. Full ROM of cervical spine without eliciting pain. No palpable muscle tightness/spasm along cervical paraspinal musculature.  Lungs:   Clear to auscultation bilaterally, respirations unlabored  Heart:  Regular rate and rhythm, S1 and S2 normal, no murmur, rub, or gallop  Extremities: Extremities normal, atraumatic, no cyanosis or edema  Pulses: 2+ and symmetric  Skin: Skin color, texture, turgor normal, no rashes or lesions  Lymph nodes: Cervical, supraclavicular, and axillary nodes normal  Neurologic: Normal. Finger to nose WNL. Negative Romberg test. No pronator drift. Walking heel to toe WNL. 5/5 bilateral hand grip strength. No gait abnormality.    Assessment & Plan:    Exam findings, diagnosis etiology and medication use and indications reviewed with patient. Follow-Up and discharge instructions provided. No emergent/urgent issues found on exam.  Patient education was provided.   Patient verbalized understanding of information provided and agrees with plan of care (POC), all questions answered. The patient is advised to call or return to clinic if condition does not see an improvement in symptoms, or to seek the care of the closest emergency department if condition worsens with the below plan.    1. Acute non intractable tension-type headache - methylPREDNISolone (MEDROL DOSEPAK) 4 MG TBPK tablet; Take as directed on packaging  Dispense: 21 tablet; Refill: 0 -  diazepam (VALIUM) 10 MG tablet; Take 1 tablet (10 mg total) by mouth every 8 (eight) hours as needed for anxiety.  Dispense: 10 tablet; Refill: 0  PDMP for patient reviewed. Patient prescribed quantity #30 Lorazepam 0.5mg  on 11/06/2017 by Pricilla Holm MD, patient's PCP at [facility]. No concerning prescriptions. Safe to proceed with Valium rx.  2. Acute non-recurrent sinusitis, unspecified location - doxycycline (VIBRA-TABS) 100 MG tablet; Take 1  tablet (100 mg total) by mouth 2 (two) times daily for 7 days.  Dispense: 14 tablet; Refill: 0  Patient with 4 day history of sinusitis symptoms; frontal sinus pressure/congestion with rhinorrhea, nasal congestion, PND and ear fullness/pressure. Advised continuation of Flonase and OTC decongestant. Prescribed Doxycycline; 100mg  bid x 7 days (decided to treat sinusitis for possible bacterial cause given complication of severe headache).  Patient with severe headache this morning. 9/10 in severity. Fairly sudden onset/woke patient up from sleeping at 5am. However, has significantly lessened in severity. No red flags currently; fever, change in vision, photosensitivity/photophobia, gait abnormality, nausea/vomiting, dizziness, altered level of consciousness. Limited risk factors; no hypertension history, no smoking history, no recent head injury, no family history of AVM. Normal neuro exam at this time. Patient with extensive headache history; though severe, recent headache similar to previous tension headaches. At this time, will treat patient with Medrol Dose Pak (not a true allergy, facial swelling when also having soy allergy, patient wishes to try again) and Diazepam. Advised rest. Patient will be with boyfriend all day today being monitored - advised immediate evaluation at ED if headache worsens/returns, patient developed dizziness/lightheadedness, confusion, slurred speech, nausea/vomiting, change in vision, photosensitivity/photophobia, arm/leg weakness/paresthesias, or other new/concerning symptom.    Darlin Priestly, MHS, PA-C Montey Hora, MHS, PA-C Advanced Practice Provider [facility]  [address], [facility], [place],  [address] (p):  [phone] [email or web address]

## 2018-02-18 ENCOUNTER — Telehealth: Payer: Self-pay | Admitting: Emergency Medicine

## 2018-02-18 NOTE — Telephone Encounter (Signed)
Left message following up on visit with [facility] 

## 2018-02-25 NOTE — Progress Notes (Signed)
[name] [place], [place] [address] Phone: [phone] Subjective:   Fontaine No, am serving as a scribe for [name].   CC: Back pain follow-up  EHM:[id number]  Michelle Waller is a 28 y.o. female coming in with complaint of back pain. Last seen on 01/02/2018. Patient states that she feels out of place in her back but no injury since last visit.  Patient did start her workout on a regular basis.  Feels like is doing relatively well though.      Past Medical History:  Diagnosis Date  . Eating disorder   . Frequent headaches   . Gastritis   . GERD (gastroesophageal reflux disease)   . Hiatal hernia   . IBS (irritable bowel syndrome)   . Kidney stones   . Ovarian cyst   . UTI (urinary tract infection)   . Vision abnormalities    Past Surgical History:  Procedure Laterality Date  . ADENOIDECTOMY    . COLONOSCOPY    . CYSTOSCOPY    . KNEE SURGERY     Social History   Socioeconomic History  . Marital status: Single    Spouse name: Not on file  . Number of children: Not on file  . Years of education: Not on file  . Highest education level: Not on file  Occupational History  . Not on file  Social Needs  . Financial resource strain: Not on file  . Food insecurity:    Worry: Not on file    Inability: Not on file  . Transportation needs:    Medical: Not on file    Non-medical: Not on file  Tobacco Use  . Smoking status: Former Smoker    Last attempt to quit: 11/09/2011    Years since quitting: 6.3  . Smokeless tobacco: Never Used  Substance and Sexual Activity  . Alcohol use: Yes    Alcohol/week: 0.0 standard drinks    Comment: occasionally   . Drug use: No  . Sexual activity: Not on file  Lifestyle  . Physical activity:    Days per week: Not on file    Minutes per session: Not on file  . Stress: Not on file  Relationships  . Social connections:    Talks on phone: Not on file    Gets together: Not  on file    Attends religious service: Not on file    Active member of club or organization: Not on file    Attends meetings of clubs or organizations: Not on file    Relationship status: Not on file  Other Topics Concern  . Not on file  Social History Narrative  . Not on file   Allergies  Allergen Reactions  . Soy Allergy Anaphylaxis    Large amounts cause anaphylaxis Small amounts cause upset stomach  . Medrol [Methylprednisolone]     Facial swelling    Family History  Problem Relation Age of Onset  . Depression Mother   . Atrial fibrillation Mother   . Hypertension Father   . Arthritis Maternal Grandmother   . Heart disease Maternal Grandmother   . Cancer Paternal Grandmother        breast  . Hypertension Paternal Grandmother   . Heart disease Paternal Grandfather   . Hypertension Paternal Grandfather   . Diabetes Paternal Grandfather   . Diabetes Mellitus II Sister   . Hypothyroidism Sister     Current Outpatient Medications (Endocrine & Metabolic):  .  levothyroxine (SYNTHROID, LEVOTHROID) 50 MCG tablet, TAKE 1 TABLET BY MOUTH DAILY BEFORE BREAKFAST. .  methylPREDNISolone (MEDROL DOSEPAK) 4 MG TBPK tablet, Take as directed on packaging   Current Outpatient Medications (Respiratory):  .  albuterol (PROVENTIL HFA;VENTOLIN HFA) 108 (90 Base) MCG/ACT inhaler, Inhale 2 puffs into the lungs every 4 (four) hours as needed for wheezing or shortness of breath.    Current Outpatient Medications (Other):  .  diazepam (VALIUM) 10 MG tablet, Take 1 tablet (10 mg total) by mouth every 8 (eight) hours as needed for anxiety. Marland Kitchen  FLUoxetine (PROZAC) 10 MG capsule, Take 1 capsule (10 mg total) by mouth daily. Marland Kitchen  FLUoxetine (PROZAC) 20 MG capsule, Take 1 capsule (20 mg total) by mouth daily. .  hyoscyamine (LEVSIN SL) 0.125 MG SL tablet, Place 1 tablet (0.125 mg total) under the tongue every 8 (eight) hours as needed (IBS pain). .  LORazepam (ATIVAN) 0.5 MG tablet, Take 1 tablet  (0.5 mg total) by mouth daily as needed for anxiety. .  pantoprazole (PROTONIX) 20 MG tablet, Take 1 tablet (20 mg total) by mouth daily.    Past medical history, social, surgical and family history all reviewed in electronic medical record.  No pertanent information unless stated regarding to the chief complaint.   Review of Systems:  No headache, visual changes, nausea, vomiting, diarrhea, constipation, dizziness, abdominal pain, skin rash, fevers, chills, night sweats, weight loss, swollen lymph nodes, body aches, joint swelling, chest pain, shortness of breath, mood changes.  No muscle aches  Objective  Blood pressure 102/62, pulse 77, height 5\' 3"  (1.6 m), weight 145 lb (65.8 kg), SpO2 99 %.    General: No apparent distress alert and oriented x3 mood and affect normal, dressed appropriately.  HEENT: Pupils equal, extraocular movements intact  Respiratory: Patient's speak in full sentences and does not appear short of breath  Cardiovascular: No lower extremity edema, non tender, no erythema  Skin: Warm dry intact with no signs of infection or rash on extremities or on axial skeleton.  Abdomen: Soft nontender  Neuro: Cranial nerves II through XII are intact, neurovascularly intact in all extremities with 2+ DTRs and 2+ pulses.  Lymph: No lymphadenopathy of posterior or anterior cervical chain or axillae bilaterally.  Gait normal with good balance and coordination.  MSK:  Non tender with full range of motion and good stability and symmetric strength and tone of shoulders, elbows, wrist, hip, knee and ankles bilaterally.  Neck: Inspection mild loss of lordosis. No palpable stepoffs. Negative Spurling's maneuver. Full neck range of motion Grip strength and sensation normal in bilateral hands Strength good C4 to T1 distribution No sensory change to C4 to T1 Negative Hoffman sign bilaterally Reflexes normal  Back Exam:  Inspection: Loss of lordosis Motion: Flexion 45 deg, Extension  25 deg, Side Bending to 35 deg bilaterally, Rotation to 25 deg bilaterally  SLR laying: Negative  XSLR laying: Negative  Palpable tenderness: To palpation of the paraspinal musculature. FABER: Mild tightness bilaterally. Sensory change: Gross sensation intact to all lumbar and sacral dermatomes.  Reflexes: 2+ at both patellar tendons, 2+ at achilles tendons, Babinski's downgoing.  Strength at foot  Plantar-flexion: 5/5 Dorsi-flexion: 5/5 Eversion: 5/5 Inversion: 5/5  Leg strength  Quad: 5/5 Hamstring: 5/5 Hip flexor: 5/5 Hip abductors: 5/5  Gait unremarkable.  Osteopathic findings C2 flexed rotated and side bent right C6 flexed rotated and side bent left T3 extended rotated and side bent right inhaled third rib T7 extended rotated and side bent  left L2 flexed rotated and side bent right Sacrum right on right     Impression and Recommendations:     This case required medical decision making of moderate complexity. The above documentation has been reviewed and is accurate and complete Lyndal Pulley, DO       Note: This dictation was prepared with Dragon dictation along with smaller phrase technology. Any transcriptional errors that result from this process are unintentional.

## 2018-02-27 ENCOUNTER — Ambulatory Visit (INDEPENDENT_AMBULATORY_CARE_PROVIDER_SITE_OTHER): Payer: 59 | Admitting: Family Medicine

## 2018-02-27 ENCOUNTER — Encounter: Payer: Self-pay | Admitting: Family Medicine

## 2018-02-27 VITALS — BP 102/62 | HR 77 | Ht 63.0 in | Wt 145.0 lb

## 2018-02-27 DIAGNOSIS — M940 Chondrocostal junction syndrome [Tietze]: Secondary | ICD-10-CM

## 2018-02-27 DIAGNOSIS — M999 Biomechanical lesion, unspecified: Secondary | ICD-10-CM

## 2018-02-27 NOTE — Assessment & Plan Note (Signed)
Decision today to treat with OMT was based on Physical Exam  After verbal consent patient was treated with HVLA, ME, FPR techniques in cervical, thoracic, rib lumbar and sacral areas  Patient tolerated the procedure well with improvement in symptoms  Patient given exercises, stretches and lifestyle modifications  See medications in patient instructions if given  Patient will follow up in 6-8 weeks 

## 2018-02-27 NOTE — Patient Instructions (Signed)
You are awesome  You know the  Drill  See me 8 weeks

## 2018-02-27 NOTE — Assessment & Plan Note (Signed)
Continues to have some difficulty.  Seems to be more on the right side today than the left.  Discussed posture and ergonomics.  Patient is starting to work on a more regular basis we discussed about recovery that I think will be beneficial.  Discussed icing regimen, home exercises.  Follow-up again 6 to 8 weeks

## 2018-03-05 ENCOUNTER — Ambulatory Visit (AMBULATORY_SURGERY_CENTER): Payer: Self-pay | Admitting: *Deleted

## 2018-03-05 ENCOUNTER — Encounter: Payer: Self-pay | Admitting: Internal Medicine

## 2018-03-05 VITALS — Ht 62.0 in | Wt 145.0 lb

## 2018-03-05 DIAGNOSIS — R1084 Generalized abdominal pain: Secondary | ICD-10-CM

## 2018-03-05 DIAGNOSIS — R197 Diarrhea, unspecified: Secondary | ICD-10-CM

## 2018-03-05 MED ORDER — NA SULFATE-K SULFATE-MG SULF 17.5-3.13-1.6 GM/177ML PO SOLN: 1.0000 | Freq: Once | ORAL | 0 refills | Status: AC

## 2018-03-05 NOTE — Progress Notes (Signed)
No egg or soy allergy known to patient  No issues with past sedation with any surgeries  or procedures, no intubation problems --pt eats some soy  No diet pills per patient No home 02 use per patient  No blood thinners per patient  Pt denies issues with constipation  No A fib or A flutter  EMMI video sent to pt's e mail - pt declined

## 2018-03-06 ENCOUNTER — Telehealth: Payer: Self-pay | Admitting: *Deleted

## 2018-03-06 NOTE — Telephone Encounter (Signed)
Thanks so so very much .  Lelan Pons PV

## 2018-03-06 NOTE — Telephone Encounter (Signed)
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 She was concerned in [facility] and has sent a my chart message to [facility] as well- See Allergy Notes   Please advise, Thanks Lelan Pons

## 2018-03-06 NOTE — Telephone Encounter (Signed)
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  Either way should be fine for propofol.  Thanks,  Osvaldo Angst

## 2018-03-06 NOTE — Telephone Encounter (Signed)
Last procedure she had Fentanyl and Versed in 2019-  But she told me she eats soy in small amounts!! FYI

## 2018-03-19 ENCOUNTER — Ambulatory Visit (AMBULATORY_SURGERY_CENTER): Payer: 59 | Admitting: Internal Medicine

## 2018-03-19 ENCOUNTER — Encounter: Payer: Self-pay | Admitting: Internal Medicine

## 2018-03-19 VITALS — BP 110/73 | HR 55 | Temp 98.6°F | Resp 9 | Ht 63.0 in | Wt 145.0 lb

## 2018-03-19 DIAGNOSIS — R1084 Generalized abdominal pain: Secondary | ICD-10-CM | POA: Diagnosis not present

## 2018-03-19 DIAGNOSIS — R197 Diarrhea, unspecified: Secondary | ICD-10-CM

## 2018-03-19 MED ORDER — SODIUM CHLORIDE 0.9 % IV SOLN: 500.0000 mL | Freq: Once | INTRAVENOUS | Status: DC

## 2018-03-19 NOTE — Progress Notes (Signed)
Spontaneous respirations throughout. VSS. Resting comfortably. To PACU on room air. Report to  RN. 

## 2018-03-19 NOTE — Op Note (Signed)
[facility] Patient Name: Michelle Waller Procedure Date: 03/19/2018 8:16 AM MRN: [id number] Endoscopist: Jerene Bears , MD Age: 28 Referring MD:  Date of Birth: [birth year] Gender: Female Account #: [id number] Procedure:                Colonoscopy Indications:              Generalized abdominal pain, Clinically significant                            diarrhea of unexplained origin, abdominal bloating Medicines:                Fentanyl 150 micrograms IV, Midazolam 6 mg IV Procedure:                Pre-Anesthesia Assessment:                           - Prior to the procedure, a History and Physical                            was performed, and patient medications and                            allergies were reviewed. The patient's tolerance of                            previous anesthesia was also reviewed. The risks                            and benefits of the procedure and the sedation                            options and risks were discussed with the patient.                            All questions were answered, and informed consent                            was obtained. Prior Anticoagulants: The patient has                            taken no previous anticoagulant or antiplatelet                            agents. ASA Grade Assessment: II - A patient with                            mild systemic disease. After reviewing the risks                            and benefits, the patient was deemed in                            satisfactory condition to undergo the procedure.  After obtaining informed consent, the colonoscope                            was passed under direct vision. Throughout the                            procedure, the patient's blood pressure, pulse, and                            oxygen saturations were monitored continuously. The                            Colonoscope was introduced through the anus and                             advanced to the terminal ileum. The colonoscopy was                            performed without difficulty. The patient tolerated                            the procedure well. The quality of the bowel                            preparation was excellent. The terminal ileum,                            ileocecal valve, appendiceal orifice, and rectum                            were photographed. Scope In: 8:32:12 AM Scope Out: 8:47:16 AM Scope Withdrawal Time: 0 hours 11 minutes 25 seconds  Total Procedure Duration: 0 hours 15 minutes 4 seconds  Findings:                 The digital rectal exam was normal.                           The terminal ileum appeared normal.                           The entire examined colon appeared normal on direct                            and retroflexion views.                           Biopsies for histology were taken with a cold                            forceps from the right colon, left colon and rectum                            for evaluation of microscopic colitis. Complications:  No immediate complications. Estimated Blood Loss:     Estimated blood loss was minimal. Impression:               - The examined portion of the ileum was normal.                           - The entire examined colon is normal on direct and                            retroflexion views.                           - Biopsies were taken with a cold forceps from the                            right colon, left colon and rectum for evaluation                            of microscopic colitis. Recommendation:           - Patient has a contact number available for                            emergencies. The signs and symptoms of potential                            delayed complications were discussed with the                            patient. Return to normal activities tomorrow.                            Written discharge instructions were provided to the                             patient.                           - Resume previous diet.                           - Continue present medications. Change pantoprazole                            to famotidine (Pepcid) 20 mg once to twice daily to                            see if this improves loose stools/diarrhea.                           - Await pathology results. If unrevealing and not                            improved with discontinuation of PPI, then  rifaximin 550 mg TID x 14 days is recommended.                           - No recommendation at this time regarding repeat                            colonoscopy due to young age.                           - Office follow-up with me in 2-3 months. Jerene Bears, MD 03/19/2018 8:54:53 AM This report has been signed electronically.

## 2018-03-19 NOTE — Progress Notes (Signed)
Pt's states no medical or surgical changes since previsit or office visit. 

## 2018-03-19 NOTE — Patient Instructions (Signed)
Discharge instructions given. Biopsies taken. Change protonix to pepcid. Office follow-up in 2-3 months. Office will schedule.  YOU HAD AN ENDOSCOPIC PROCEDURE TODAY AT [facility]:   Refer to the procedure report that was given to you for any specific questions about what was found during the examination.  If the procedure report does not answer your questions, please call your gastroenterologist to clarify.  If you requested that your care partner not be given the details of your procedure findings, then the procedure report has been included in a sealed envelope for you to review at your convenience later.  YOU SHOULD EXPECT: Some feelings of bloating in the abdomen. Passage of more gas than usual.  Walking can help get rid of the air that was put into your GI tract during the procedure and reduce the bloating. If you had a lower endoscopy (such as a colonoscopy or flexible sigmoidoscopy) you may notice spotting of blood in your stool or on the toilet paper. If you underwent a bowel prep for your procedure, you may not have a normal bowel movement for a few days.  Please Note:  You might notice some irritation and congestion in your nose or some drainage.  This is from the oxygen used during your procedure.  There is no need for concern and it should clear up in a day or so.  SYMPTOMS TO REPORT IMMEDIATELY:   Following lower endoscopy (colonoscopy or flexible sigmoidoscopy):  Excessive amounts of blood in the stool  Significant tenderness or worsening of abdominal pains  Swelling of the abdomen that is new, acute  Fever of 100F or higher   For urgent or emergent issues, a gastroenterologist can be reached at any hour by calling [phone].   DIET:  We do recommend a small meal at first, but then you may proceed to your regular diet.  Drink plenty of fluids but you should avoid alcoholic beverages for 24 hours.  ACTIVITY:  You should plan to take it easy for the rest  of today and you should NOT DRIVE or use heavy machinery until tomorrow (because of the sedation medicines used during the test).    FOLLOW UP: Our staff will call the number listed on your records the next business day following your procedure to check on you and address any questions or concerns that you may have regarding the information given to you following your procedure. If we do not reach you, we will leave a message.  However, if you are feeling well and you are not experiencing any problems, there is no need to return our call.  We will assume that you have returned to your regular daily activities without incident.  If any biopsies were taken you will be contacted by phone or by letter within the next 1-3 weeks.  Please call us at [phone] if you have not heard about the biopsies in 3 weeks.    SIGNATURES/CONFIDENTIALITY: You and/or your care partner have signed paperwork which will be entered into your electronic medical record.  These signatures attest to the fact that that the information above on your After Visit Summary has been reviewed and is understood.  Full responsibility of the confidentiality of this discharge information lies with you and/or your care-partner.

## 2018-03-19 NOTE — Progress Notes (Signed)
Called to room to assist during endoscopic procedure.  Patient ID and intended procedure confirmed with present staff. Received instructions for my participation in the procedure from the performing physician.  

## 2018-03-20 ENCOUNTER — Telehealth: Payer: Self-pay

## 2018-03-20 NOTE — Telephone Encounter (Signed)
Left message on f/u call 

## 2018-03-20 NOTE — Telephone Encounter (Signed)
No answer, left message to call if having any issues or concerns, B.Marea Reasner RN 

## 2018-04-12 ENCOUNTER — Other Ambulatory Visit: Payer: Self-pay | Admitting: Internal Medicine

## 2018-04-25 ENCOUNTER — Ambulatory Visit: Payer: 59 | Admitting: Family Medicine

## 2018-04-28 DIAGNOSIS — H1045 Other chronic allergic conjunctivitis: Secondary | ICD-10-CM | POA: Diagnosis not present

## 2018-04-29 ENCOUNTER — Telehealth: Payer: 59 | Admitting: Family

## 2018-04-29 DIAGNOSIS — H00012 Hordeolum externum right lower eyelid: Secondary | ICD-10-CM

## 2018-04-29 NOTE — Progress Notes (Signed)
We are sorry that you are not feeling well. Here is how we plan to help!  Based on what you have shared with me it looks like you have a stye.  A stye is an inflammation of the eyelid.  It is often a red, painful lump near the edge of the eyelid that may look like a boil or a pimple.  A stye develops when an infection occurs at the base of an eyelash.   We have made appropriate suggestions for you based upon your presentation: Your condition may have resulted from an allergy causing your eye to itch and water (excessive tearing). In addition to using warm compresses applied to the eye for 10-15 minutes up to four times a day, we recommend the use of Claritin eye drops that you can obtain at your local pharmacy.  Use these drops as directed on the package.  HOME CARE:   Wash your hands often!  Let the stye open on its own. Don't squeeze or open it.  Don't rub your eyes. This can irritate your eyes and let in bacteria.  If you need to touch your eyes, wash your hands first.  Don't wear eye makeup or contact lenses until the area has healed.  GET HELP RIGHT AWAY IF:   Your symptoms do not improve.  You develop blurred or loss of vision.  Your symptoms worsen (increased discharge, pain or redness).  Thank you for choosing an e-visit.  Your e-visit answers were reviewed by a board certified advanced clinical practitioner to complete your personal care plan.  Depending upon the condition, your plan could have included both over the counter or prescription medications.  Please review your pharmacy choice.  Make sure the pharmacy is open so you can pick up prescription now.  If there is a problem, you may contact your provider through [employer] and have the prescription routed to another pharmacy.    Your safety is important to [place].  If you have drug allergies check your prescription carefully.  For the next 24 hours you can use MyChart to ask questions about today's visit, request  a non-urgent call back, or ask for a work or school excuse.  You will get an email in the next two days asking about your experience.  I hope you that your e-visit has been valuable and will speed your recovery.

## 2018-05-15 ENCOUNTER — Encounter: Payer: Self-pay | Admitting: Internal Medicine

## 2018-05-25 NOTE — Progress Notes (Signed)
[name] [address] [place], [place] [address] Phone: [phone] Subjective:   I Michelle Waller am serving as a Education administrator for [name].   CC: Neck and upper back pain follow-up  [id number]  Michelle Waller is a 28 y.o. female coming in with complaint of back pain. States that she believes some of her ribs are out of place.  Recent tightness.  Not doing her activities as regularly as she should.  Patient has been sitting a lot more frequently.  Patient's job is changed as well.      Past Medical History:  Diagnosis Date  . Allergy   . Anxiety   . Asthma    physical induced asthma   . Eating disorder   . Frequent headaches   . Gastritis   . GERD (gastroesophageal reflux disease)   . Hiatal hernia   . IBS (irritable bowel syndrome)   . Kidney stones   . Ovarian cyst   . Thyroid disease   . UTI (urinary tract infection)   . Vision abnormalities    Past Surgical History:  Procedure Laterality Date  . ADENOIDECTOMY    . COLONOSCOPY    . CYSTOSCOPY    . KNEE SURGERY     Social History   Socioeconomic History  . Marital status: Single    Spouse name: Not on file  . Number of children: Not on file  . Years of education: Not on file  . Highest education level: Not on file  Occupational History  . Not on file  Social Needs  . Financial resource strain: Not on file  . Food insecurity:    Worry: Not on file    Inability: Not on file  . Transportation needs:    Medical: Not on file    Non-medical: Not on file  Tobacco Use  . Smoking status: Former Smoker    Last attempt to quit: 11/09/2011    Years since quitting: 6.5  . Smokeless tobacco: Never Used  Substance and Sexual Activity  . Alcohol use: Yes    Alcohol/week: 0.0 standard drinks    Comment: occasionally   . Drug use: No  . Sexual activity: Not on file  Lifestyle  . Physical activity:    Days per week: Not on file    Minutes per session: Not on file  .  Stress: Not on file  Relationships  . Social connections:    Talks on phone: Not on file    Gets together: Not on file    Attends religious service: Not on file    Active member of club or organization: Not on file    Attends meetings of clubs or organizations: Not on file    Relationship status: Not on file  Other Topics Concern  . Not on file  Social History Narrative  . Not on file   Allergies  Allergen Reactions  . Soy Allergy Anaphylaxis    Large amounts cause anaphylaxis Small amounts cause upset stomach   Family History  Problem Relation Age of Onset  . Depression Mother   . Atrial fibrillation Mother   . Hypertension Father   . Colon polyps Father   . Arthritis Maternal Grandmother   . Heart disease Maternal Grandmother   . Cancer Paternal Grandmother        breast  . Hypertension Paternal Grandmother   . Heart disease Paternal Grandfather   . Hypertension Paternal Grandfather   . Diabetes Paternal Grandfather   .  Diabetes Mellitus II Sister   . Hypothyroidism Sister   . Colon cancer Neg Hx   . Esophageal cancer Neg Hx   . Rectal cancer Neg Hx   . Stomach cancer Neg Hx     Current Outpatient Medications (Endocrine & Metabolic):  .  levothyroxine (SYNTHROID, LEVOTHROID) 50 MCG tablet, TAKE 1 TABLET BY MOUTH DAILY BEFORE BREAKFAST.   Current Outpatient Medications (Respiratory):  .  albuterol (PROVENTIL HFA;VENTOLIN HFA) 108 (90 Base) MCG/ACT inhaler, Inhale 2 puffs into the lungs every 4 (four) hours as needed for wheezing or shortness of breath.   Current Outpatient Medications (Hematological):  Marland Kitchen  Cyanocobalamin (VITAMIN B 12 PO), Take by mouth. .  ferrous sulfate 325 (65 FE) MG tablet, Take 325 mg by mouth daily with breakfast.  Current Outpatient Medications (Other):  .  cholecalciferol (VITAMIN D3) 25 MCG (1000 UT) tablet, Take 1,000 Units by mouth daily. .  diazepam (VALIUM) 10 MG tablet, Take 1 tablet (10 mg total) by mouth every 8 (eight) hours  as needed for anxiety. .  dicyclomine (BENTYL) 20 MG tablet, Take 20 mg by mouth every 6 (six) hours. Marland Kitchen  FLUoxetine (PROZAC) 10 MG capsule, Take 1 capsule (10 mg total) by mouth daily. Marland Kitchen  FLUoxetine (PROZAC) 20 MG capsule, Take 1 capsule (20 mg total) by mouth daily. Marland Kitchen  LORazepam (ATIVAN) 0.5 MG tablet, Take 1 tablet (0.5 mg total) by mouth daily as needed for anxiety.    Past medical history, social, surgical and family history all reviewed in electronic medical record.  No pertanent information unless stated regarding to the chief complaint.   Review of Systems:  No headache, visual changes, nausea, vomiting, diarrhea, constipation, dizziness, abdominal pain, skin rash, fevers, chills, night sweats, weight loss, swollen lymph nodes, body aches, joint swelling,  chest pain, shortness of breath, mood changes.  Positive muscle aches  Objective  Blood pressure 100/82, pulse 74, height 5\' 3"  (1.6 m), weight 142 lb (64.4 kg), SpO2 98 %.    General: No apparent distress alert and oriented x3 mood and affect normal, dressed appropriately.  HEENT: Pupils equal, extraocular movements intact  Respiratory: Patient's speak in full sentences and does not appear short of breath  Cardiovascular: No lower extremity edema, non tender, no erythema  Skin: Warm dry intact with no signs of infection or rash on extremities or on axial skeleton.  Abdomen: Soft nontender  Neuro: Cranial nerves II through XII are intact, neurovascularly intact in all extremities with 2+ DTRs and 2+ pulses.  Lymph: No lymphadenopathy of posterior or anterior cervical chain or axillae bilaterally.  Gait normal with good balance and coordination.  MSK:  Non tender with full range of motion and good stability and symmetric strength and tone of shoulders, elbows, wrist, hip, knee and ankles bilaterally.  Neck: Inspection loss of lordosis. No palpable stepoffs. Negative Spurling's maneuver. Full neck range of motion Grip strength  and sensation normal in bilateral hands Strength good C4 to T1 distribution No sensory change to C4 to T1 Negative Hoffman sign bilaterally Reflexes normal Trapezius bilaterally  Osteopathic findings  C2 flexed rotated and side bent right C4 flexed rotated and side bent left C6 flexed rotated and side bent left T3 extended rotated and side bent right inhaled third rib T9 extended rotated and side bent left L2 flexed rotated and side bent right Sacrum right on right    Impression and Recommendations:     This case required medical decision making of moderate complexity. The above  documentation has been reviewed and is accurate and complete Lyndal Pulley, DO       Note: This dictation was prepared with Dragon dictation along with smaller phrase technology. Any transcriptional errors that result from this process are unintentional.

## 2018-05-27 ENCOUNTER — Encounter: Payer: Self-pay | Admitting: Family Medicine

## 2018-05-27 ENCOUNTER — Other Ambulatory Visit: Payer: Self-pay

## 2018-05-27 ENCOUNTER — Ambulatory Visit (INDEPENDENT_AMBULATORY_CARE_PROVIDER_SITE_OTHER): Payer: 59 | Admitting: Family Medicine

## 2018-05-27 VITALS — BP 100/82 | HR 74 | Ht 63.0 in | Wt 142.0 lb

## 2018-05-27 DIAGNOSIS — M940 Chondrocostal junction syndrome [Tietze]: Secondary | ICD-10-CM

## 2018-05-27 DIAGNOSIS — M999 Biomechanical lesion, unspecified: Secondary | ICD-10-CM

## 2018-05-27 NOTE — Assessment & Plan Note (Signed)
Decision today to treat with OMT was based on Physical Exam  After verbal consent patient was treated with HVLA, ME, FPR techniques in cervical, thoracic, rib lumbar and sacral areas  Patient tolerated the procedure well with improvement in symptoms  Patient given exercises, stretches and lifestyle modifications  See medications in patient instructions if given  Patient will follow up in 4-8 weeks 

## 2018-05-27 NOTE — Patient Instructions (Signed)
God to see you  Michelle Waller is your friend Stay active Tape the wrist with a lot of extension with the wrist  See me again in 4-5 weeks Be safe

## 2018-05-27 NOTE — Assessment & Plan Note (Signed)
Patient has hypermobility syndrome.  We discussed with patient again at great length.  We discussed icing regimen and home exercise.  Discussed which activities of doing which was to avoid.  Increase activity slowly.  Discussed posture and ergonomics.  Follow-up again 4 to 8 weeks

## 2018-06-10 ENCOUNTER — Ambulatory Visit: Payer: Self-pay

## 2018-06-10 ENCOUNTER — Other Ambulatory Visit: Payer: Self-pay

## 2018-06-10 ENCOUNTER — Ambulatory Visit (INDEPENDENT_AMBULATORY_CARE_PROVIDER_SITE_OTHER): Payer: 59 | Admitting: Family Medicine

## 2018-06-10 ENCOUNTER — Encounter: Payer: Self-pay | Admitting: Family Medicine

## 2018-06-10 VITALS — BP 128/72 | HR 75 | Ht 63.0 in | Wt 145.0 lb

## 2018-06-10 DIAGNOSIS — M79602 Pain in left arm: Secondary | ICD-10-CM

## 2018-06-10 DIAGNOSIS — S46209A Unspecified injury of muscle, fascia and tendon of other parts of biceps, unspecified arm, initial encounter: Secondary | ICD-10-CM | POA: Diagnosis not present

## 2018-06-10 NOTE — Progress Notes (Signed)
[name] [place], [place] [address] Phone: [phone] Subjective:   Michelle Waller, am serving as a scribe for [name].   CC: Left arm pain  [id number]  Michelle Waller is a 28 y.o. female coming in with complaint of left elbow pain. Moved some boulders in her hard and had pain the next day in her bicep. Pain is over lateral epicondyle into the bicep. Pain is achy and numb in left arm.  Patient states that seems to be worse.  Has had a subluxation of the radial head previously.  States it felt like this initially but does feel little different.  Denies any swelling.  Denies any significant weakness    Past Medical History:  Diagnosis Date  . Allergy   . Anxiety   . Asthma    physical induced asthma   . Eating disorder   . Frequent headaches   . Gastritis   . GERD (gastroesophageal reflux disease)   . Hiatal hernia   . IBS (irritable bowel syndrome)   . Kidney stones   . Ovarian cyst   . Thyroid disease   . UTI (urinary tract infection)   . Vision abnormalities    Past Surgical History:  Procedure Laterality Date  . ADENOIDECTOMY    . COLONOSCOPY    . CYSTOSCOPY    . KNEE SURGERY     Social History   Socioeconomic History  . Marital status: Single    Spouse name: Not on file  . Number of children: Not on file  . Years of education: Not on file  . Highest education level: Not on file  Occupational History  . Not on file  Social Needs  . Financial resource strain: Not on file  . Food insecurity:    Worry: Not on file    Inability: Not on file  . Transportation needs:    Medical: Not on file    Non-medical: Not on file  Tobacco Use  . Smoking status: Former Smoker    Last attempt to quit: 11/09/2011    Years since quitting: 6.5  . Smokeless tobacco: Never Used  Substance and Sexual Activity  . Alcohol use: Yes    Alcohol/week: 0.0 standard drinks    Comment: occasionally   . Drug use: Waller  .  Sexual activity: Not on file  Lifestyle  . Physical activity:    Days per week: Not on file    Minutes per session: Not on file  . Stress: Not on file  Relationships  . Social connections:    Talks on phone: Not on file    Gets together: Not on file    Attends religious service: Not on file    Active member of club or organization: Not on file    Attends meetings of clubs or organizations: Not on file    Relationship status: Not on file  Other Topics Concern  . Not on file  Social History Narrative  . Not on file   Allergies  Allergen Reactions  . Soy Allergy Anaphylaxis    Large amounts cause anaphylaxis Small amounts cause upset stomach   Family History  Problem Relation Age of Onset  . Depression Mother   . Atrial fibrillation Mother   . Hypertension Father   . Colon polyps Father   . Arthritis Maternal Grandmother   . Heart disease Maternal Grandmother   . Cancer Paternal Grandmother  breast  . Hypertension Paternal Grandmother   . Heart disease Paternal Grandfather   . Hypertension Paternal Grandfather   . Diabetes Paternal Grandfather   . Diabetes Mellitus II Sister   . Hypothyroidism Sister   . Colon cancer Neg Hx   . Esophageal cancer Neg Hx   . Rectal cancer Neg Hx   . Stomach cancer Neg Hx     Current Outpatient Medications (Endocrine & Metabolic):  .  levothyroxine (SYNTHROID, LEVOTHROID) 50 MCG tablet, TAKE 1 TABLET BY MOUTH DAILY BEFORE BREAKFAST.   Current Outpatient Medications (Respiratory):  .  albuterol (PROVENTIL HFA;VENTOLIN HFA) 108 (90 Base) MCG/ACT inhaler, Inhale 2 puffs into the lungs every 4 (four) hours as needed for wheezing or shortness of breath.   Current Outpatient Medications (Hematological):  Marland Kitchen  Cyanocobalamin (VITAMIN B 12 PO), Take by mouth. .  ferrous sulfate 325 (65 FE) MG tablet, Take 325 mg by mouth daily with breakfast.  Current Outpatient Medications (Other):  .  cholecalciferol (VITAMIN D3) 25 MCG (1000 UT)  tablet, Take 1,000 Units by mouth daily. .  diazepam (VALIUM) 10 MG tablet, Take 1 tablet (10 mg total) by mouth every 8 (eight) hours as needed for anxiety. .  dicyclomine (BENTYL) 20 MG tablet, Take 20 mg by mouth every 6 (six) hours. Marland Kitchen  FLUoxetine (PROZAC) 10 MG capsule, Take 1 capsule (10 mg total) by mouth daily. Marland Kitchen  FLUoxetine (PROZAC) 20 MG capsule, Take 1 capsule (20 mg total) by mouth daily. Marland Kitchen  LORazepam (ATIVAN) 0.5 MG tablet, Take 1 tablet (0.5 mg total) by mouth daily as needed for anxiety.    Past medical history, social, surgical and family history all reviewed in electronic medical record.  Waller pertanent information unless stated regarding to the chief complaint.   Review of Systems:  Waller headache, visual changes, nausea, vomiting, diarrhea, constipation, dizziness, abdominal pain, skin rash, fevers, chills, night sweats, weight loss, swollen lymph nodes, body aches, joint swelling, chest pain, shortness of breath, mood changes.  Positive muscle aches  Objective  Blood pressure 128/72, pulse 75, height 5\' 3"  (1.6 m), weight 145 lb (65.8 kg), SpO2 98 %.   General: Waller apparent distress alert and oriented x3 mood and affect normal, dressed appropriately.  HEENT: Pupils equal, extraocular movements intact  Respiratory: Patient's speak in full sentences and does not appear short of breath  Cardiovascular: Waller lower extremity edema, non tender, Waller erythema  Skin: Warm dry intact with Waller signs of infection or rash on extremities or on axial skeleton.  Abdomen: Soft nontender  Neuro: Cranial nerves II through XII are intact, neurovascularly intact in all extremities with 2+ DTRs and 2+ pulses.  Lymph: Waller lymphadenopathy of posterior or anterior cervical chain or axillae bilaterally.  Gait normal with good balance and coordination.  MSK:  Non tender with full range of motion and good stability and symmetric strength and tone of shoulders, , wrist, hip, knee and ankles bilaterally.   Elbow: Left Unremarkable to inspection. Range of motion full pronation, supination, flexion, extension. Strength is full to all of the above directions Stable to varus, valgus stress. Negative moving valgus stress test.  Tender over the bicep and the brachial radialis area. Ulnar nerve does not sublux. Negative cubital tunnel Tinel's. Contralateral elbow unremarkable   Musculoskeletal ultrasound was performed and interpreted by Charlann Boxer D.O.   Elbow:  Patient's radial head does not sublux at the time.  Patient's brachial radialis as well as bicep muscle lowering does show some hyperacute  changes and can be with a secondary strain.  Waller true defect noted.  IMPRESSION: Possible bicep injury Impression and Recommendations:     This case required medical decision making of moderate complexity. The above documentation has been reviewed and is accurate and complete Lyndal Pulley, DO       Note: This dictation was prepared with Dragon dictation along with smaller phrase technology. Any transcriptional errors that result from this process are unintentional.

## 2018-06-10 NOTE — Patient Instructions (Signed)
You are special  Ice is your friend Compression daily for at least a week plus then with working out for another 2-3 weeks Exercises 3 times a week.   See me again in 4 weeks

## 2018-06-10 NOTE — Assessment & Plan Note (Signed)
Patient does have more of a bicep injury.  Discussed with patient about compression, icing regimen.  Radial head and seemed to be unremarkable.  Patient given home exercises and range of motion exercises that I think will be helpful.  No heavy lifting for 2 weeks.  Follow-up again in 3 weeks

## 2018-07-08 ENCOUNTER — Encounter: Payer: Self-pay | Admitting: Family Medicine

## 2018-07-08 ENCOUNTER — Ambulatory Visit (INDEPENDENT_AMBULATORY_CARE_PROVIDER_SITE_OTHER): Payer: 59 | Admitting: Family Medicine

## 2018-07-08 ENCOUNTER — Other Ambulatory Visit: Payer: Self-pay

## 2018-07-08 VITALS — BP 100/86 | HR 69 | Ht 63.0 in | Wt 145.0 lb

## 2018-07-08 DIAGNOSIS — M940 Chondrocostal junction syndrome [Tietze]: Secondary | ICD-10-CM

## 2018-07-08 DIAGNOSIS — M999 Biomechanical lesion, unspecified: Secondary | ICD-10-CM | POA: Diagnosis not present

## 2018-07-08 NOTE — Patient Instructions (Signed)
Good to see you.  Doing well! See me again in 3 weeks

## 2018-07-08 NOTE — Progress Notes (Signed)
[facility] [place], [place] [address] Phone: [phone] Subjective:   I Michelle Waller am serving as a Education administrator for [name].  I'm seeing this patient by the request  of:    CC:   [id number]   05/31/2018 Patient does have more of a bicep injury.  Discussed with patient about compression, icing regimen.  Radial head and seemed to be unremarkable.  Patient given home exercises and range of motion exercises that I think will be helpful.  No heavy lifting for 2 weeks.  Follow-up again in 3 weeks  07/08/2018 Michelle Waller is a 28 y.o. female coming in with complaint of right arm pain. States that she is feeling much better Patient has not had a significant amount of pain at the moment.  And seems to be doing relatively well.  Has some mild increase in stress.  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    Past Medical History:  Diagnosis Date  . Allergy   . Anxiety   . Asthma    physical induced asthma   . Eating disorder   . Frequent headaches   . Gastritis   . GERD (gastroesophageal reflux disease)   . Hiatal hernia   . IBS (irritable bowel syndrome)   . Kidney stones   . Ovarian cyst   . Thyroid disease   . UTI (urinary tract infection)   . Vision abnormalities    Past Surgical History:  Procedure Laterality Date  . ADENOIDECTOMY    . COLONOSCOPY    . CYSTOSCOPY    . KNEE SURGERY     Social History   Socioeconomic History  . Marital status: Single    Spouse name: Not on file  . Number of children: Not on file  . Years of education: Not on file  . Highest education level: Not on file  Occupational History  . Not on file  Social Needs  . Financial resource strain: Not on file  . Food insecurity    Worry: Not on file    Inability: Not on file  . Transportation needs    Medical: Not on file    Non-medical: Not on file  Tobacco Use  . Smoking status: Former Smoker    Quit date: 11/09/2011    Years since  quitting: 6.6  . Smokeless tobacco: Never Used  Substance and Sexual Activity  . Alcohol use: Yes    Alcohol/week: 0.0 standard drinks    Comment: occasionally   . Drug use: No  . Sexual activity: Not on file  Lifestyle  . Physical activity    Days per week: Not on file    Minutes per session: Not on file  . Stress: Not on file  Relationships  . Social Herbalist on phone: Not on file    Gets together: Not on file    Attends religious service: Not on file    Active member of club or organization: Not on file    Attends meetings of clubs or organizations: Not on file    Relationship status: Not on file  Other Topics Concern  . Not on file  Social History Narrative  . Not on file   Allergies  Allergen Reactions  . Soy Allergy Anaphylaxis    Large amounts cause anaphylaxis Small amounts cause upset stomach   Family History  Problem Relation Age of Onset  . Depression Mother   . Atrial fibrillation Mother   .  Hypertension Father   . Colon polyps Father   . Arthritis Maternal Grandmother   . Heart disease Maternal Grandmother   . Cancer Paternal Grandmother        breast  . Hypertension Paternal Grandmother   . Heart disease Paternal Grandfather   . Hypertension Paternal Grandfather   . Diabetes Paternal Grandfather   . Diabetes Mellitus II Sister   . Hypothyroidism Sister   . Colon cancer Neg Hx   . Esophageal cancer Neg Hx   . Rectal cancer Neg Hx   . Stomach cancer Neg Hx     Current Outpatient Medications (Endocrine & Metabolic):  .  levothyroxine (SYNTHROID, LEVOTHROID) 50 MCG tablet, TAKE 1 TABLET BY MOUTH DAILY BEFORE BREAKFAST.   Current Outpatient Medications (Respiratory):  .  albuterol (PROVENTIL HFA;VENTOLIN HFA) 108 (90 Base) MCG/ACT inhaler, Inhale 2 puffs into the lungs every 4 (four) hours as needed for wheezing or shortness of breath.   Current Outpatient Medications (Hematological):  Marland Kitchen  Cyanocobalamin (VITAMIN B 12 PO), Take by  mouth. .  ferrous sulfate 325 (65 FE) MG tablet, Take 325 mg by mouth daily with breakfast.  Current Outpatient Medications (Other):  .  cholecalciferol (VITAMIN D3) 25 MCG (1000 UT) tablet, Take 1,000 Units by mouth daily. .  diazepam (VALIUM) 10 MG tablet, Take 1 tablet (10 mg total) by mouth every 8 (eight) hours as needed for anxiety. .  dicyclomine (BENTYL) 20 MG tablet, Take 20 mg by mouth every 6 (six) hours. Marland Kitchen  FLUoxetine (PROZAC) 10 MG capsule, Take 1 capsule (10 mg total) by mouth daily. Marland Kitchen  FLUoxetine (PROZAC) 20 MG capsule, Take 1 capsule (20 mg total) by mouth daily. Marland Kitchen  LORazepam (ATIVAN) 0.5 MG tablet, Take 1 tablet (0.5 mg total) by mouth daily as needed for anxiety.    Past medical history, social, surgical and family history all reviewed in electronic medical record.  No pertanent information unless stated regarding to the chief complaint.   Review of Systems:  No headache, visual changes, nausea, vomiting, diarrhea, constipation, dizziness, abdominal pain, skin rash, fevers, chills, night sweats, weight loss, swollen lymph nodes, body aches, joint swelling,chest pain, shortness of breath, mood changes.  Positive muscle aches  Objective  Blood pressure 100/86, pulse 69, height 5\' 3"  (1.6 m), weight 145 lb (65.8 kg), SpO2 98 %.   General: No apparent distress alert and oriented x3 mood and affect normal, dressed appropriately.  HEENT: Pupils equal, extraocular movements intact  Respiratory: Patient's speak in full sentences and does not appear short of breath  Cardiovascular: No lower extremity edema, non tender, no erythema  Skin: Warm dry intact with no signs of infection or rash on extremities or on axial skeleton.  Abdomen: Soft nontender  Neuro: Cranial nerves II through XII are intact, neurovascularly intact in all extremities with 2+ DTRs and 2+ pulses.  Lymph: No lymphadenopathy of posterior or anterior cervical chain or axillae bilaterally.  Gait normal with good  balance and coordination.  MSK:  Non tender with full range of motion and good stability and symmetric strength and tone of shoulders, elbows, wrist, hip, knee and ankles bilaterally.  Neck: Inspection mild loss of lordosis. No palpable stepoffs. Negative Spurling's maneuver. Full neck range of motion Grip strength and sensation normal in bilateral hands Strength good C4 to T1 distribution No sensory change to C4 to T1 Negative Hoffman sign bilaterally Reflexes normal Tightness of the trapezius bilaterally  Back Exam:  Inspection: Unremarkable  Motion: Flexion 45 deg,  Extension 20 deg, Side Bending to 20 deg bilaterally,  Rotation to 45 deg bilaterally  SLR laying: Negative  XSLR laying: Negative  Palpable tenderness: None. FABER: negative. Sensory change: Gross sensation intact to all lumbar and sacral dermatomes.  Reflexes: 2+ at both patellar tendons, 2+ at achilles tendons, Babinski's downgoing.  Strength at foot  Plantar-flexion: 5/5 Dorsi-flexion: 5/5 Eversion: 5/5 Inversion: 5/5  Leg strength  Quad: 5/5 Hamstring: 5/5 Hip flexor: 5/5 Hip abductors: 5/5  Gait unremarkable.  Osteopathic findings C2 flexed rotated and side bent right C6 flexed rotated and side bent left T5 extended rotated and side bent right inhaled rib T9 extended rotated and side bent left L2 flexed rotated and side bent right Sacrum right on right    Impression and Recommendations:     This case required medical decision making of moderate complexity. The above documentation has been reviewed and is accurate and complete Lyndal Pulley, DO       Note: This dictation was prepared with Dragon dictation along with smaller phrase technology. Any transcriptional errors that result from this process are unintentional.

## 2018-07-08 NOTE — Assessment & Plan Note (Signed)
Decision today to treat with OMT was based on Physical Exam  After verbal consent patient was treated with HVLA, ME, FPR techniques in cervical, thoracic, rib, lumbar and sacral areas  Patient tolerated the procedure well with improvement in symptoms  Patient given exercises, stretches and lifestyle modifications  See medications in patient instructions if given  Patient will follow up in 4-6 weeks 

## 2018-07-08 NOTE — Assessment & Plan Note (Signed)
Slipped rib syndrome.  Discussed icing regimen, home exercises.  Doing much better overall.  No significant changes in management.  Follow-up again in 4 to 6 weeks

## 2018-07-12 ENCOUNTER — Other Ambulatory Visit: Payer: Self-pay | Admitting: Internal Medicine

## 2018-07-12 NOTE — Telephone Encounter (Signed)
Can have 1 month supply but needs physical

## 2018-08-06 ENCOUNTER — Ambulatory Visit (INDEPENDENT_AMBULATORY_CARE_PROVIDER_SITE_OTHER): Payer: 59 | Admitting: Family Medicine

## 2018-08-06 ENCOUNTER — Encounter: Payer: Self-pay | Admitting: Family Medicine

## 2018-08-06 ENCOUNTER — Other Ambulatory Visit: Payer: Self-pay

## 2018-08-06 VITALS — BP 110/70 | HR 72 | Ht 63.0 in

## 2018-08-06 DIAGNOSIS — M999 Biomechanical lesion, unspecified: Secondary | ICD-10-CM | POA: Diagnosis not present

## 2018-08-06 DIAGNOSIS — M940 Chondrocostal junction syndrome [Tietze]: Secondary | ICD-10-CM

## 2018-08-06 NOTE — Assessment & Plan Note (Signed)
Stable overall.  Responds well to manipulation.  Discussed icing regimen and home exercise.  Discussed core strengthening.  Patient will follow-up with me again in 4 to 8 weeks.

## 2018-08-06 NOTE — Patient Instructions (Addendum)
See me again in 5-6 weeks Take Vitamin C 500mg  with Iron

## 2018-08-06 NOTE — Assessment & Plan Note (Signed)
Decision today to treat with OMT was based on Physical Exam  After verbal consent patient was treated with HVLA, ME, FPR techniques in cervical, thoracic, rib lumbar and sacral areas  Patient tolerated the procedure well with improvement in symptoms  Patient given exercises, stretches and lifestyle modifications  See medications in patient instructions if given  Patient will follow up in 4-8 weeks 

## 2018-08-06 NOTE — Progress Notes (Signed)
[name] [address], [place] [address] Phone: [phone] Subjective:   Fontaine No, am serving as a scribe for [name].   CC: Low back pain  Michelle Waller:Michelle Waller  Michelle Waller is a 28 y.o. female coming in with complaint of back pain. Last seen on 07/08/2018. Patient states that she has been doing well. No change in pain since last visit.  Mild tightness overall.  No radiation.  Patient has been working out on a more regular basis.    Past Medical History:  Diagnosis Date  . Allergy   . Anxiety   . Asthma    physical induced asthma   . Eating disorder   . Frequent headaches   . Gastritis   . GERD (gastroesophageal reflux disease)   . Hiatal hernia   . IBS (irritable bowel syndrome)   . Kidney stones   . Ovarian cyst   . Thyroid disease   . UTI (urinary tract infection)   . Vision abnormalities    Past Surgical History:  Procedure Laterality Date  . ADENOIDECTOMY    . COLONOSCOPY    . CYSTOSCOPY    . KNEE SURGERY     Social History   Socioeconomic History  . Marital status: Single    Spouse name: Not on file  . Number of children: Not on file  . Years of education: Not on file  . Highest education level: Not on file  Occupational History  . Not on file  Social Needs  . Financial resource strain: Not on file  . Food insecurity    Worry: Not on file    Inability: Not on file  . Transportation needs    Medical: Not on file    Non-medical: Not on file  Tobacco Use  . Smoking status: Former Smoker    Quit date: 11/09/2011    Years since quitting: 6.7  . Smokeless tobacco: Never Used  Substance and Sexual Activity  . Alcohol use: Yes    Alcohol/week: 0.0 standard drinks    Comment: occasionally   . Drug use: No  . Sexual activity: Not on file  Lifestyle  . Physical activity    Days per week: Not on file    Minutes per session: Not on file  . Stress: Not on file  Relationships  . Social Herbalist on phone: Not on file    Gets together: Not on file    Attends religious service: Not on file    Active member of club or organization: Not on file    Attends meetings of clubs or organizations: Not on file    Relationship status: Not on file  Other Topics Concern  . Not on file  Social History Narrative  . Not on file   Allergies  Allergen Reactions  . Soy Allergy Anaphylaxis    Large amounts cause anaphylaxis Small amounts cause upset stomach   Family History  Problem Relation Age of Onset  . Depression Mother   . Atrial fibrillation Mother   . Hypertension Father   . Colon polyps Father   . Arthritis Maternal Grandmother   . Heart disease Maternal Grandmother   . Cancer Paternal Grandmother        breast  . Hypertension Paternal Grandmother   . Heart disease Paternal Grandfather   . Hypertension Paternal Grandfather   . Diabetes Paternal Grandfather   . Diabetes Mellitus II Sister   . Hypothyroidism Sister   .  Colon cancer Neg Hx   . Esophageal cancer Neg Hx   . Rectal cancer Neg Hx   . Stomach cancer Neg Hx     Current Outpatient Medications (Endocrine & Metabolic):  .  levothyroxine (SYNTHROID) 50 MCG tablet, Take 1 tablet (50 mcg total) by mouth daily before breakfast. Need appointment for further refills   Current Outpatient Medications (Respiratory):  .  albuterol (PROVENTIL HFA;VENTOLIN HFA) 108 (90 Base) MCG/ACT inhaler, Inhale 2 puffs into the lungs every 4 (four) hours as needed for wheezing or shortness of breath.   Current Outpatient Medications (Hematological):  Marland Kitchen  Cyanocobalamin (VITAMIN B 12 PO), Take by mouth. .  ferrous sulfate 325 (65 FE) MG tablet, Take 325 mg by mouth daily with breakfast.  Current Outpatient Medications (Other):  .  cholecalciferol (VITAMIN D3) 25 MCG (1000 UT) tablet, Take 1,000 Units by mouth daily. .  diazepam (VALIUM) 10 MG tablet, Take 1 tablet (10 mg total) by mouth every 8 (eight) hours as needed for anxiety.  .  dicyclomine (BENTYL) 20 MG tablet, Take 20 mg by mouth every 6 (six) hours. Marland Kitchen  FLUoxetine (PROZAC) 10 MG capsule, Take 1 capsule (10 mg total) by mouth daily. Marland Kitchen  FLUoxetine (PROZAC) 20 MG capsule, Take 1 capsule (20 mg total) by mouth daily. Marland Kitchen  LORazepam (ATIVAN) 0.5 MG tablet, Take 1 tablet (0.5 mg total) by mouth daily as needed for anxiety.    Past medical history, social, surgical and family history all reviewed in electronic medical record.  No pertanent information unless stated regarding to the chief complaint.   Review of Systems:  No headache, visual changes, nausea, vomiting, diarrhea, constipation, dizziness, abdominal pain, skin rash, fevers, chills, night sweats, weight loss, swollen lymph nodes, body aches, joint swelling,  chest pain, shortness of breath, mood changes.  Positive muscle aches  Objective  Blood pressure 110/70, pulse 72, height 5\' 3"  (1.6 m), SpO2 99 %.    General: No apparent distress alert and oriented x3 mood and affect normal, dressed appropriately.  HEENT: Pupils equal, extraocular movements intact  Respiratory: Patient's speak in full sentences and does not appear short of breath  Cardiovascular: No lower extremity edema, non tender, no erythema  Skin: Warm dry intact with no signs of infection or rash on extremities or on axial skeleton.  Abdomen: Soft nontender  Neuro: Cranial nerves II through XII are intact, neurovascularly intact in all extremities with 2+ DTRs and 2+ pulses.  Lymph: No lymphadenopathy of posterior or anterior cervical chain or axillae bilaterally.  Gait normal with good balance and coordination.  MSK:  Non tender with full range of motion and good stability and symmetric strength and tone of shoulders, elbows, wrist, hip, knee and ankles bilaterally.  Neck: Inspection unremarkable. No palpable stepoffs. Negative Spurling's maneuver. Full neck range of motion Grip strength and sensation normal in bilateral hands Strength  good C4 to T1 distribution No sensory change to C4 to T1 Negative Hoffman sign bilaterally Reflexes normal  Back Exam:  Inspection: Mild loss of lordosis Motion: Flexion 45 deg, Extension 20 deg, Side Bending to 45 deg bilaterally,  Rotation to 45 deg bilaterally  SLR laying: Negative  XSLR laying: Negative  Palpable tenderness: Tender to palpation paraspinal musculature lumbar spine right greater than left. FABER: negative. Sensory change: Gross sensation intact to all lumbar and sacral dermatomes.  Reflexes: 2+ at both patellar tendons, 2+ at achilles tendons, Babinski's downgoing.  Strength at foot  Plantar-flexion: 5/5 Dorsi-flexion: 5/5 Eversion: 5/5 Inversion:  5/5  Leg strength  Quad: 5/5 Hamstring: 5/5 Hip flexor: 5/5 Hip abductors: 5/5   Osteopathic findings C2 flexed rotated and side bent right C6 flexed rotated and side bent left T3 extended rotated and side bent right inhaled third rib L2 flexed rotated and side bent right Sacrum right on right    Impression and Recommendations:     This case required medical decision making of moderate complexity. The above documentation has been reviewed and is accurate and complete Lyndal Pulley, DO       Note: This dictation was prepared with Dragon dictation along with smaller phrase technology. Any transcriptional errors that result from this process are unintentional.

## 2018-08-08 DIAGNOSIS — F331 Major depressive disorder, recurrent, moderate: Secondary | ICD-10-CM | POA: Diagnosis not present

## 2018-08-08 DIAGNOSIS — F411 Generalized anxiety disorder: Secondary | ICD-10-CM | POA: Diagnosis not present

## 2018-08-08 DIAGNOSIS — F4312 Post-traumatic stress disorder, chronic: Secondary | ICD-10-CM | POA: Diagnosis not present

## 2018-08-10 ENCOUNTER — Encounter: Payer: Self-pay | Admitting: Family Medicine

## 2018-08-12 ENCOUNTER — Other Ambulatory Visit: Payer: Self-pay

## 2018-08-12 MED ORDER — ALBUTEROL SULFATE HFA 108 (90 BASE) MCG/ACT IN AERS: 2.0000 | INHALATION_SPRAY | Freq: Four times a day (QID) | RESPIRATORY_TRACT | 6 refills | Status: DC | PRN

## 2018-08-16 ENCOUNTER — Other Ambulatory Visit: Payer: Self-pay | Admitting: Internal Medicine

## 2018-08-27 ENCOUNTER — Encounter: Payer: 59 | Admitting: Internal Medicine

## 2018-08-28 ENCOUNTER — Encounter: Payer: 59 | Admitting: Internal Medicine

## 2018-08-29 ENCOUNTER — Encounter: Payer: Self-pay | Admitting: Internal Medicine

## 2018-08-29 ENCOUNTER — Other Ambulatory Visit: Payer: Self-pay

## 2018-08-29 ENCOUNTER — Ambulatory Visit (INDEPENDENT_AMBULATORY_CARE_PROVIDER_SITE_OTHER): Payer: 59 | Admitting: Internal Medicine

## 2018-08-29 ENCOUNTER — Other Ambulatory Visit (INDEPENDENT_AMBULATORY_CARE_PROVIDER_SITE_OTHER): Payer: 59

## 2018-08-29 VITALS — BP 110/80 | HR 74 | Temp 98.5°F | Ht 63.0 in | Wt 145.0 lb

## 2018-08-29 DIAGNOSIS — F411 Generalized anxiety disorder: Secondary | ICD-10-CM | POA: Diagnosis not present

## 2018-08-29 DIAGNOSIS — Z0000 Encounter for general adult medical examination without abnormal findings: Secondary | ICD-10-CM

## 2018-08-29 DIAGNOSIS — F4312 Post-traumatic stress disorder, chronic: Secondary | ICD-10-CM | POA: Diagnosis not present

## 2018-08-29 DIAGNOSIS — E039 Hypothyroidism, unspecified: Secondary | ICD-10-CM | POA: Diagnosis not present

## 2018-08-29 DIAGNOSIS — F331 Major depressive disorder, recurrent, moderate: Secondary | ICD-10-CM | POA: Diagnosis not present

## 2018-08-29 LAB — COMPREHENSIVE METABOLIC PANEL
ALT: 15 U/L (ref 0–35)
AST: 20 U/L (ref 0–37)
Albumin: 4.5 g/dL (ref 3.5–5.2)
Alkaline Phosphatase: 68 U/L (ref 39–117)
BUN: 8 mg/dL (ref 6–23)
CO2: 28 mEq/L (ref 19–32)
Calcium: 9.5 mg/dL (ref 8.4–10.5)
Chloride: 104 mEq/L (ref 96–112)
Creatinine, Ser: 0.64 mg/dL (ref 0.40–1.20)
GFR: 109.47 mL/min (ref >60.00)
Glucose, Bld: 85 mg/dL (ref 70–99)
Potassium: 4.4 mEq/L (ref 3.5–5.1)
Sodium: 139 mEq/L (ref 135–145)
Total Bilirubin: 0.3 mg/dL (ref 0.2–1.2)
Total Protein: 7.5 g/dL (ref 6.0–8.3)

## 2018-08-29 LAB — LIPID PANEL
Cholesterol: 180 mg/dL (ref 0–200)
HDL: 63.7 mg/dL (ref >39.00)
LDL Cholesterol: 88 mg/dL (ref 0–99)
NonHDL: 116.71
Total CHOL/HDL Ratio: 3
Triglycerides: 144 mg/dL (ref 0.0–149.0)
VLDL: 28.8 mg/dL (ref 0.0–40.0)

## 2018-08-29 LAB — TSH: TSH: 1.56 u[IU]/mL (ref 0.35–4.50)

## 2018-08-29 LAB — CBC
HCT: 38.4 % (ref 36.0–46.0)
Hemoglobin: 13.1 g/dL (ref 12.0–15.0)
MCHC: 34 g/dL (ref 30.0–36.0)
MCV: 90.7 fl (ref 78.0–100.0)
Platelets: 194 10*3/uL (ref 150.0–400.0)
RBC: 4.24 Mil/uL (ref 3.87–5.11)
RDW: 12.2 % (ref 11.5–15.5)
WBC: 3.4 10*3/uL — ABNORMAL LOW (ref 4.0–10.5)

## 2018-08-29 LAB — T4, FREE: Free T4: 0.78 ng/dL (ref 0.60–1.60)

## 2018-08-29 NOTE — Assessment & Plan Note (Signed)
Checking TSH and free T4, adjust synthroid 50 mcg daily as needed.

## 2018-08-29 NOTE — Assessment & Plan Note (Signed)
Flu shot yearly. Tetanus agreed but we forgot to give. Pap smear up to date with gyn. Counseled about sun safety and mole surveillance. Counseled about the dangers of distracted driving. Given 10 year screening recommendations.

## 2018-08-29 NOTE — Patient Instructions (Signed)
Health Maintenance, Female Adopting a healthy lifestyle and getting preventive care are important in promoting health and wellness. Ask your health care provider about:  The right schedule for you to have regular tests and exams.  Things you can do on your own to prevent diseases and keep yourself healthy. What should I know about diet, weight, and exercise? Eat a healthy diet   Eat a diet that includes plenty of vegetables, fruits, low-fat dairy products, and lean protein.  Do not eat a lot of foods that are high in solid fats, added sugars, or sodium. Maintain a healthy weight Body mass index (BMI) is used to identify weight problems. It estimates body fat based on height and weight. Your health care provider can help determine your BMI and help you achieve or maintain a healthy weight. Get regular exercise Get regular exercise. This is one of the most important things you can do for your health. Most adults should:  Exercise for at least 150 minutes each week. The exercise should increase your heart rate and make you sweat (moderate-intensity exercise).  Do strengthening exercises at least twice a week. This is in addition to the moderate-intensity exercise.  Spend less time sitting. Even light physical activity can be beneficial. Watch cholesterol and blood lipids Have your blood tested for lipids and cholesterol at 29 years of age, then have this test every 5 years. Have your cholesterol levels checked more often if:  Your lipid or cholesterol levels are high.  You are older than 29 years of age.  You are at high risk for heart disease. What should I know about cancer screening? Depending on your health history and family history, you may need to have cancer screening at various ages. This may include screening for:  Breast cancer.  Cervical cancer.  Colorectal cancer.  Skin cancer.  Lung cancer. What should I know about heart disease, diabetes, and high blood  pressure? Blood pressure and heart disease  High blood pressure causes heart disease and increases the risk of stroke. This is more likely to develop in people who have high blood pressure readings, are of African descent, or are overweight.  Have your blood pressure checked: ? Every 3-5 years if you are 18-39 years of age. ? Every year if you are 40 years old or older. Diabetes Have regular diabetes screenings. This checks your fasting blood sugar level. Have the screening done:  Once every three years after age 40 if you are at a normal weight and have a low risk for diabetes.  More often and at a younger age if you are overweight or have a high risk for diabetes. What should I know about preventing infection? Hepatitis B If you have a higher risk for hepatitis B, you should be screened for this virus. Talk with your health care provider to find out if you are at risk for hepatitis B infection. Hepatitis C Testing is recommended for:  Everyone born from 1945 through 1965.  Anyone with known risk factors for hepatitis C. Sexually transmitted infections (STIs)  Get screened for STIs, including gonorrhea and chlamydia, if: ? You are sexually active and are younger than 29 years of age. ? You are older than 29 years of age and your health care provider tells you that you are at risk for this type of infection. ? Your sexual activity has changed since you were last screened, and you are at increased risk for chlamydia or gonorrhea. Ask your health care provider if   you are at risk.  Ask your health care provider about whether you are at high risk for HIV. Your health care provider may recommend a prescription medicine to help prevent HIV infection. If you choose to take medicine to prevent HIV, you should first get tested for HIV. You should then be tested every 3 months for as long as you are taking the medicine. Pregnancy  If you are about to stop having your period (premenopausal) and  you may become pregnant, seek counseling before you get pregnant.  Take 400 to 800 micrograms (mcg) of folic acid every day if you become pregnant.  Ask for birth control (contraception) if you want to prevent pregnancy. Osteoporosis and menopause Osteoporosis is a disease in which the bones lose minerals and strength with aging. This can result in bone fractures. If you are 65 years old or older, or if you are at risk for osteoporosis and fractures, ask your health care provider if you should:  Be screened for bone loss.  Take a calcium or vitamin D supplement to lower your risk of fractures.  Be given hormone replacement therapy (HRT) to treat symptoms of menopause. Follow these instructions at home: Lifestyle  Do not use any products that contain nicotine or tobacco, such as cigarettes, e-cigarettes, and chewing tobacco. If you need help quitting, ask your health care provider.  Do not use street drugs.  Do not share needles.  Ask your health care provider for help if you need support or information about quitting drugs. Alcohol use  Do not drink alcohol if: ? Your health care provider tells you not to drink. ? You are pregnant, may be pregnant, or are planning to become pregnant.  If you drink alcohol: ? Limit how much you use to 0-1 drink a day. ? Limit intake if you are breastfeeding.  Be aware of how much alcohol is in your drink. In the [place] one drink equals one 12 oz bottle of beer (355 mL), one 5 oz glass of wine (148 mL), or one 1 oz glass of hard liquor (44 mL). General instructions  Schedule regular health, dental, and eye exams.  Stay current with your vaccines.  Tell your health care provider if: ? You often feel depressed. ? You have ever been abused or do not feel safe at home. Summary  Adopting a healthy lifestyle and getting preventive care are important in promoting health and wellness.  Follow your health care provider's instructions about healthy  diet, exercising, and getting tested or screened for diseases.  Follow your health care provider's instructions on monitoring your cholesterol and blood pressure. This information is not intended to replace advice given to you by your health care provider. Make sure you discuss any questions you have with your health care provider. Document Released: 07/25/2010 Document Revised: 01/02/2018 Document Reviewed: 01/02/2018 Elsevier Patient Education  2020 [employer] Inc[employer]  

## 2018-08-29 NOTE — Progress Notes (Signed)
   Subjective:   Patient ID: Michelle Waller, female    DOB: 1990-07-30, 28 y.o.   MRN: [id number]  HPI The patient is a 28 YO female coming in for physical.   PMH, [place], social history reviewed and updated  Review of Systems  Constitutional: Negative.   HENT: Negative.   Eyes: Negative.   Respiratory: Negative for cough, chest tightness and shortness of breath.   Cardiovascular: Negative for chest pain, palpitations and leg swelling.  Gastrointestinal: Negative for abdominal distention, abdominal pain, constipation, diarrhea, nausea and vomiting.  Musculoskeletal: Negative.   Skin: Negative.   Neurological: Negative.   Psychiatric/Behavioral: Negative.     Objective:  Physical Exam Constitutional:      Appearance: She is well-developed.  HENT:     Head: Normocephalic and atraumatic.  Neck:     Musculoskeletal: Normal range of motion.  Cardiovascular:     Rate and Rhythm: Normal rate and regular rhythm.  Pulmonary:     Effort: Pulmonary effort is normal. No respiratory distress.     Breath sounds: Normal breath sounds. No wheezing or rales.  Abdominal:     General: Bowel sounds are normal. There is no distension.     Palpations: Abdomen is soft.     Tenderness: There is no abdominal tenderness. There is no rebound.  Skin:    General: Skin is warm and dry.  Neurological:     Mental Status: She is alert and oriented to person, place, and time.     Coordination: Coordination normal.     Vitals:   08/29/18 1257  BP: 110/80  Pulse: 74  Temp: 98.5 F (36.9 C)  TempSrc: Oral  SpO2: 99%  Weight: 145 lb (65.8 kg)  Height: 5\' 3"  (1.6 m)    Assessment & Plan:

## 2018-09-10 ENCOUNTER — Encounter: Payer: Self-pay | Admitting: Internal Medicine

## 2018-09-10 DIAGNOSIS — I83819 Varicose veins of unspecified lower extremities with pain: Secondary | ICD-10-CM

## 2018-09-15 NOTE — Progress Notes (Signed)
[name] [address] [place], [place] [address] Phone: [phone] Subjective:   Fontaine No, am serving as a scribe for [name].  I'm seeing this patient by the request  of:    CC: Back pain follow-up  [id number]  Michelle Waller is a 28 y.o. female coming in with complaint of back pain. Is here for OMT. Was last seen on 08/06/2018. Patient states Doing relatively well.  Continues to have some mild discomfort and pain.  Working out.  Not going to be getting married in November.    Past Medical History:  Diagnosis Date  . Allergy   . Anxiety   . Asthma    physical induced asthma   . Eating disorder   . Frequent headaches   . Gastritis   . GERD (gastroesophageal reflux disease)   . Hiatal hernia   . IBS (irritable bowel syndrome)   . Kidney stones   . Ovarian cyst   . Thyroid disease   . UTI (urinary tract infection)   . Vision abnormalities    Past Surgical History:  Procedure Laterality Date  . ADENOIDECTOMY    . COLONOSCOPY    . CYSTOSCOPY    . KNEE SURGERY     Social History   Socioeconomic History  . Marital status: Single    Spouse name: Not on file  . Number of children: Not on file  . Years of education: Not on file  . Highest education level: Not on file  Occupational History  . Not on file  Social Needs  . Financial resource strain: Not on file  . Food insecurity    Worry: Not on file    Inability: Not on file  . Transportation needs    Medical: Not on file    Non-medical: Not on file  Tobacco Use  . Smoking status: Former Smoker    Quit date: 11/09/2011    Years since quitting: 6.8  . Smokeless tobacco: Never Used  Substance and Sexual Activity  . Alcohol use: Yes    Alcohol/week: 0.0 standard drinks    Comment: occasionally   . Drug use: No  . Sexual activity: Not on file  Lifestyle  . Physical activity    Days per week: Not on file    Minutes per session: Not on file  . Stress: Not on  file  Relationships  . Social Herbalist on phone: Not on file    Gets together: Not on file    Attends religious service: Not on file    Active member of club or organization: Not on file    Attends meetings of clubs or organizations: Not on file    Relationship status: Not on file  Other Topics Concern  . Not on file  Social History Narrative  . Not on file   Allergies  Allergen Reactions  . Soy Allergy Anaphylaxis    Large amounts cause anaphylaxis Small amounts cause upset stomach   Family History  Problem Relation Age of Onset  . Depression Mother   . Atrial fibrillation Mother   . Hypertension Father   . Colon polyps Father   . Arthritis Maternal Grandmother   . Heart disease Maternal Grandmother   . Cancer Paternal Grandmother        breast  . Hypertension Paternal Grandmother   . Heart disease Paternal Grandfather   . Hypertension Paternal Grandfather   . Diabetes Paternal Grandfather   .  Diabetes Mellitus II Sister   . Hypothyroidism Sister   . Colon cancer Neg Hx   . Esophageal cancer Neg Hx   . Rectal cancer Neg Hx   . Stomach cancer Neg Hx     Current Outpatient Medications (Endocrine & Metabolic):  .  levothyroxine (SYNTHROID) 50 MCG tablet, TAKE 1 TABLET (50 MCG TOTAL) BY MOUTH DAILY BEFORE BREAKFAST. MUST KEEP SCHEDULED APPT FOR AUGUST 6 FOR FUTURE REFILLS   Current Outpatient Medications (Respiratory):  .  albuterol (PROVENTIL HFA;VENTOLIN HFA) 108 (90 Base) MCG/ACT inhaler, Inhale 2 puffs into the lungs every 4 (four) hours as needed for wheezing or shortness of breath.   Current Outpatient Medications (Hematological):  Marland Kitchen  Cyanocobalamin (VITAMIN B 12 PO), Take by mouth. .  ferrous sulfate 325 (65 FE) MG tablet, Take 325 mg by mouth daily with breakfast.  Current Outpatient Medications (Other):  .  cholecalciferol (VITAMIN D3) 25 MCG (1000 UT) tablet, Take 1,000 Units by mouth daily. .  diazepam (VALIUM) 10 MG tablet, Take 1 tablet  (10 mg total) by mouth every 8 (eight) hours as needed for anxiety. .  dicyclomine (BENTYL) 20 MG tablet, Take 20 mg by mouth every 6 (six) hours. Marland Kitchen  LORazepam (ATIVAN) 0.5 MG tablet, Take 1 tablet (0.5 mg total) by mouth daily as needed for anxiety.    Past medical history, social, surgical and family history all reviewed in electronic medical record.  No pertanent information unless stated regarding to the chief complaint.   Review of Systems:  No headache, visual changes, nausea, vomiting, diarrhea, constipation, dizziness, abdominal pain, skin rash, fevers, chills, night sweats, weight loss, swollen lymph nodes, body aches, joint swelling, chest pain, shortness of breath, mood changes.  Positive muscle aches  Objective  Blood pressure 100/70, pulse 88, height 5\' 3"  (1.6 m), weight 157 lb (71.2 kg), SpO2 98 %.    General: No apparent distress alert and oriented x3 mood and affect normal, dressed appropriately.  HEENT: Pupils equal, extraocular movements intact  Respiratory: Patient's speak in full sentences and does not appear short of breath  Cardiovascular: No lower extremity edema, non tender, no erythema  Skin: Warm dry intact with no signs of infection or rash on extremities or on axial skeleton.  Abdomen: Soft nontender  Neuro: Cranial nerves II through XII are intact, neurovascularly intact in all extremities with 2+ DTRs and 2+ pulses.  Lymph: No lymphadenopathy of posterior or anterior cervical chain or axillae bilaterally.  Gait normal with good balance and coordination.  MSK:  Non tender with full range of motion and good stability and symmetric strength and tone of shoulders, elbows, wrist, hip, knee and ankles bilaterally.  Back Exam:  Inspection: Unremarkable  Motion: Flexion 45 deg, Extension 45 deg, Side Bending to 45 deg bilaterally,  Rotation to 45 deg bilaterally  SLR laying: Negative  XSLR laying: Negative  Palpable tenderness: Tender to palpation the paraspinal  musculature lumbar spine right greater than left. FABER: Tightness bilaterally. Sensory change: Gross sensation intact to all lumbar and sacral dermatomes.  Reflexes: 2+ at both patellar tendons, 2+ at achilles tendons, Babinski's downgoing.  Strength at foot  Plantar-flexion: 5/5 Dorsi-flexion: 5/5 Eversion: 5/5 Inversion: 5/5  Leg strength  Quad: 5/5 Hamstring: 5/5 Hip flexor: 5/5 Hip abductors: 5/5  Gait unremarkable.  Osteopathic findings  C6 flexed rotated and side bent left T3 extended rotated and side bent right inhaled third rib T9 extended rotated and side bent left L2 flexed rotated and side bent right  Sacrum right on right    Impression and Recommendations:     This case required medical decision making of moderate complexity. The above documentation has been reviewed and is accurate and complete Lyndal Pulley, DO       Note: This dictation was prepared with Dragon dictation along with smaller phrase technology. Any transcriptional errors that result from this process are unintentional.

## 2018-09-16 ENCOUNTER — Encounter: Payer: Self-pay | Admitting: Family Medicine

## 2018-09-16 ENCOUNTER — Other Ambulatory Visit: Payer: Self-pay | Admitting: Internal Medicine

## 2018-09-16 ENCOUNTER — Ambulatory Visit (INDEPENDENT_AMBULATORY_CARE_PROVIDER_SITE_OTHER): Payer: 59 | Admitting: Family Medicine

## 2018-09-16 ENCOUNTER — Other Ambulatory Visit: Payer: Self-pay

## 2018-09-16 VITALS — BP 100/70 | HR 88 | Ht 63.0 in | Wt 157.0 lb

## 2018-09-16 DIAGNOSIS — M999 Biomechanical lesion, unspecified: Secondary | ICD-10-CM | POA: Diagnosis not present

## 2018-09-16 DIAGNOSIS — M940 Chondrocostal junction syndrome [Tietze]: Secondary | ICD-10-CM | POA: Diagnosis not present

## 2018-09-16 NOTE — Assessment & Plan Note (Signed)
Decision today to treat with OMT was based on Physical Exam  After verbal consent patient was treated with HVLA, ME, FPR techniques in cervical, thoracic, rib,  lumbar and sacral areas  Patient tolerated the procedure well with improvement in symptoms  Patient given exercises, stretches and lifestyle modifications  See medications in patient instructions if given  Patient will follow up in 4-8 weeks 

## 2018-09-16 NOTE — Patient Instructions (Signed)
See me in 5-6 weeks 

## 2018-09-16 NOTE — Assessment & Plan Note (Signed)
Stable at the moment.  He was having some increasing discomfort and pain.  Discussed posture and ergonomics.  Discussed which activities of doing which will still avoid.  Patient is to increase activity as tolerated.  Follow-up again in 4-8 weeks

## 2018-09-17 ENCOUNTER — Ambulatory Visit (INDEPENDENT_AMBULATORY_CARE_PROVIDER_SITE_OTHER): Payer: Self-pay | Admitting: Physician Assistant

## 2018-09-17 ENCOUNTER — Other Ambulatory Visit: Payer: Self-pay

## 2018-09-17 VITALS — BP 110/80 | HR 65 | Temp 98.0°F | Resp 16 | Wt 148.0 lb

## 2018-09-17 DIAGNOSIS — R3129 Other microscopic hematuria: Secondary | ICD-10-CM

## 2018-09-17 DIAGNOSIS — R300 Dysuria: Secondary | ICD-10-CM

## 2018-09-17 DIAGNOSIS — B379 Candidiasis, unspecified: Secondary | ICD-10-CM

## 2018-09-17 DIAGNOSIS — T3695XA Adverse effect of unspecified systemic antibiotic, initial encounter: Secondary | ICD-10-CM

## 2018-09-17 LAB — POCT URINALYSIS DIPSTICK
Bilirubin, UA: NEGATIVE
Glucose, UA: NEGATIVE
Ketones, UA: NEGATIVE
Leukocytes, UA: NEGATIVE
Nitrite, UA: NEGATIVE
Protein, UA: NEGATIVE
Spec Grav, UA: 1.025 (ref 1.010–1.025)
Urobilinogen, UA: 0.2 E.U./dL
pH, UA: 5 (ref 5.0–8.0)

## 2018-09-17 LAB — POCT URINE PREGNANCY: Preg Test, Ur: NEGATIVE

## 2018-09-17 MED ORDER — FLUCONAZOLE 150 MG PO TABS: 150.0000 mg | ORAL_TABLET | Freq: Every day | ORAL | 0 refills | Status: AC

## 2018-09-17 MED ORDER — CEPHALEXIN 500 MG PO CAPS: 500.0000 mg | ORAL_CAPSULE | Freq: Two times a day (BID) | ORAL | 0 refills | Status: AC

## 2018-09-17 NOTE — Patient Instructions (Addendum)
Thank you for choosing [facility] for your health care needs.  You have been diagnosed with: 1. Dysuria - POCT Urinalysis Dipstick - POCT urine pregnancy - cephALEXin (KEFLEX) 500 MG capsule; Take 1 capsule (500 mg total) by mouth 2 (two) times daily for 5 days.  Dispense: 10 capsule; Refill: 0  2. Hematuria, microscopic  3. Antibiotic-induced yeast infection - fluconazole (DIFLUCAN) 150 MG tablet; Take 1 tablet (150 mg total) by mouth daily for 1 day.  Dispense: 1 tablet; Refill: 0  Take antibiotic with food to help prevent stomach upset. Take Diflucan after course of antibiotic, if you develop a vaginal yeast infection.  Increase fluids; water, Gatorade, or cranberry juice. Empty bladder frequently.  May use Pyridium (Azo) or Cystex for discomfort/burning with urination.  Follow-up with family physician, Ob/Gyn or urgent care in 2-3 days if symptoms have not resolved.  Go directly to the ED if you develop fever, chills, back ache, abdominal pain, nausea/vomiting, urinary incontinence, or other new/concerning symptom.  Hope you feel better soon!   Urinary Tract Infection, Adult A urinary tract infection (UTI) is an infection of any part of the urinary tract. The urinary tract includes:  The kidneys.  The ureters.  The bladder.  The urethra. These organs make, store, and get rid of pee (urine) in the body. What are the causes? This is caused by germs (bacteria) in your genital area. These germs grow and cause swelling (inflammation) of your urinary tract. What increases the risk? You are more likely to develop this condition if:  You have a small, thin tube (catheter) to drain pee.  You cannot control when you pee or poop (incontinence).  You are female, and: ? You use these methods to prevent pregnancy: ? A medicine that kills sperm (spermicide). ? A device that blocks sperm (diaphragm). ? You have low levels of a female hormone (estrogen). ? You are  pregnant.  You have genes that add to your risk.  You are sexually active.  You take antibiotic medicines.  You have trouble peeing because of: ? A prostate that is bigger than normal, if you are female. ? A blockage in the part of your body that drains pee from the bladder (urethra). ? A kidney stone. ? A nerve condition that affects your bladder (neurogenic bladder). ? Not getting enough to drink. ? Not peeing often enough.  You have other conditions, such as: ? Diabetes. ? A weak disease-fighting system (immune system). ? Sickle cell disease. ? Gout. ? Injury of the spine. What are the signs or symptoms? Symptoms of this condition include:  Needing to pee right away (urgently).  Peeing often.  Peeing small amounts often.  Pain or burning when peeing.  Blood in the pee.  Pee that smells bad or not like normal.  Trouble peeing.  Pee that is cloudy.  Fluid coming from the vagina, if you are female.  Pain in the belly or lower back. Other symptoms include:  Throwing up (vomiting).  No urge to eat.  Feeling mixed up (confused).  Being tired and grouchy (irritable).  A fever.  Watery poop (diarrhea). How is this treated? This condition may be treated with:  Antibiotic medicine.  Other medicines.  Drinking enough water. Follow these instructions at home:  Medicines  Take over-the-counter and prescription medicines only as told by your doctor.  If you were prescribed an antibiotic medicine, take it as told by your doctor. Do not stop taking it even if you start to  feel better. General instructions  Make sure you: ? Pee until your bladder is empty. ? Do not hold pee for a long time. ? Empty your bladder after sex. ? Wipe from front to back after pooping if you are a female. Use each tissue one time when you wipe.  Drink enough fluid to keep your pee pale yellow.  Keep all follow-up visits as told by your doctor. This is important. Contact a  doctor if:  You do not get better after 1-2 days.  Your symptoms go away and then come back. Get help right away if:  You have very bad back pain.  You have very bad pain in your lower belly.  You have a fever.  You are sick to your stomach (nauseous).  You are throwing up. Summary  A urinary tract infection (UTI) is an infection of any part of the urinary tract.  This condition is caused by germs in your genital area.  There are many risk factors for a UTI. These include having a small, thin tube to drain pee and not being able to control when you pee or poop.  Treatment includes antibiotic medicines for germs.  Drink enough fluid to keep your pee pale yellow. This information is not intended to replace advice given to you by your health care provider. Make sure you discuss any questions you have with your health care provider. Document Released: 06/28/2007 Document Revised: 12/27/2017 Document Reviewed: 07/19/2017 Elsevier Patient Education  2020 [employer].

## 2018-09-17 NOTE — Progress Notes (Signed)
Patient ID: Zamyra Risk DOB: 09-30-1990 AGE: 29 y.o. MRN: [id number]   PCP: Hoyt Koch, MD   Chief Complaint:  Chief Complaint  Patient presents with  . Urinary Tract Infection     a.m     Subjective:    HPI:  Michelle Waller is a 28 y.o. female presents for evaluation  Chief Complaint  Patient presents with  . Urinary Tract Infection     a.m    28 year old female presents to [facility] with one day history of dysuria. Present for first urination of the day, early this morning. Has since resolved. Associated suprapubic pressure and mild sensation of incomplete voiding/constantly having to urinate. Took OTC Cystex this morning, provided significant symptom relief. Denies current dysuria, increased urinary frequency, urinary urgency, cloudy urine, foul odor to urine, gross hematuria. Denies fever, chills, nausea/vomiting, abdominal pain, back pain / flank pain, vaginal rash/discharge/pruritis, urinary incontinence. Patient denies concern for STD. Patient due to start period in a few days.  Patient with previous history of ureteral stones, several episodes, once required a stent. 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 Previous history of pyelonephritis secondary to ureteral stone.   A limited review of symptoms was performed, pertinent positives and negatives as mentioned in HPI.  The following portions of the patient's history were reviewed and updated as appropriate: allergies, current medications and past medical history.  Patient Active Problem List   Diagnosis Date Noted  . Nonallopathic lesion of lumbosacral region 10/02/2017  . Nonallopathic lesion of sacral region 10/02/2017  . Irritable bowel syndrome 06/21/2017  . Routine general medical examination at a health care facility 06/14/2017  . 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 Nonallopathic lesion of thoracic region 04/30/2017  . Nonallopathic lesion of rib cage 04/30/2017  .  Nonallopathic lesion of cervical region 04/30/2017  . ANA positive 10/31/2016  . Anxiety 08/15/2016  . Hypothyroidism 02/03/2016  . GERD (gastroesophageal reflux disease) 02/11/2015    Allergies  Allergen Reactions  . Soy Allergy Anaphylaxis    Large amounts cause anaphylaxis Small amounts cause upset stomach    Current Outpatient Medications on File Prior to Visit  Medication Sig Dispense Refill  . albuterol (PROVENTIL HFA;VENTOLIN HFA) 108 (90 Base) MCG/ACT inhaler Inhale 2 puffs into the lungs every 4 (four) hours as needed for wheezing or shortness of breath. 1 each 6  . cholecalciferol (VITAMIN D3) 25 MCG (1000 UT) tablet Take 1,000 Units by mouth daily.    . Cyanocobalamin (VITAMIN B 12 PO) Take by mouth.    . diazepam (VALIUM) 10 MG tablet Take 1 tablet (10 mg total) by mouth every 8 (eight) hours as needed for anxiety. 10 tablet 0  . dicyclomine (BENTYL) 20 MG tablet Take 20 mg by mouth every 6 (six) hours.    . ferrous sulfate 325 (65 FE) MG tablet Take 325 mg by mouth daily with breakfast.    . levothyroxine (SYNTHROID) 50 MCG tablet TAKE 1 TABLET (50 MCG TOTAL) BY MOUTH DAILY BEFORE BREAKFAST. MUST KEEP SCHEDULED APPT FOR AUGUST 6 FOR FUTURE REFILLS 90 tablet 3  . LORazepam (ATIVAN) 0.5 MG tablet Take 1 tablet (0.5 mg total) by mouth daily as needed for anxiety. 30 tablet 0   No current facility-administered medications on file prior to visit.        Objective:   Vitals:   09/17/18 1707  BP: 110/80  Pulse: 65  Resp: 16  Temp: 98 F (36.7 C)  SpO2: 99%  Wt Readings from Last 3 Encounters:  09/17/18 148 lb (67.1 kg)  09/16/18 157 lb (71.2 kg)  08/29/18 145 lb (65.8 kg)    Physical Exam:   General Appearance:  Patient sitting comfortably on examination table. Conversational. [name] self-historian. In no acute distress. Afebrile.   Head:  Normocephalic, without obvious abnormality, atraumatic  Eyes:  PERRL, conjunctiva/corneas clear, EOM's intact  Neck:  Supple, symmetrical, trachea midline, no adenopathy  Lungs:   Clear to auscultation bilaterally, respirations unlabored. Good aeration. No rales, rhonchi, crackles or wheezing.  Heart:  Regular rate and rhythm, S1 and S2 normal, no murmur, rub, or gallop  Abdomen:   Normal to inspection. Normoactive bowel sounds. No tenderness with palpation. No guarding, rigidity or rebound tenderness. No palpable organomegaly. No CVA tenderness with percussion bilaterally.  Extremities: Extremities normal, atraumatic, no cyanosis or edema  Pulses: 2+ and symmetric  Skin: Skin color, texture, turgor normal, no rashes or lesions  Lymph nodes: Cervical, supraclavicular, and axillary nodes normal  Neurologic: Normal    Assessment & Plan:    Exam findings, diagnosis etiology and medication use and indications reviewed with patient. Follow-Up and discharge instructions provided. No emergent/urgent issues found on exam.  Patient education was provided.   Patient verbalized understanding of information provided and agrees with plan of care (POC), all questions answered. The patient is advised to call or return to clinic if condition does not see an improvement in symptoms, or to seek the care of the closest emergency department if condition worsens with the below plan.    1. Dysuria - POCT Urinalysis Dipstick - POCT urine pregnancy - cephALEXin (KEFLEX) 500 MG capsule; Take 1 capsule (500 mg total) by mouth 2 (two) times daily for 5 days.  Dispense: 10 capsule; Refill: 0  2. Hematuria, microscopic  3. Antibiotic-induced yeast infection - fluconazole (DIFLUCAN) 150 MG tablet; Take 1 tablet (150 mg total) by mouth daily for 1 day.  Dispense: 1 tablet; Refill: 31  28 year old female presents with one day history of dysuria and suprapubic pressure. Urinalysis reveals hematuria; no leukocytes or nitrites. VSS, afebrile, in no acute distress, benign abdominal exam. No CVA tenderness suggestive of pyelonephritis. At  this time, will treat patient empirically for suspected uncomplicated UTI. Discussed possibility of small ureteral stone, causing hematuria as opposed to cystitis. Prescribed 5-day course of Keflex 500mg  bid (has worked well for the patient in the past). Patient requested Diflucan for antibiotic induced vaginal candidiasis. Advised patient follow-up with PCP, Ob/Gyn or urgent care in 2-3 days if symptoms have not resolved. Advised immediate evaluation at ED if patient develops fever, chills, back ache, abdominal pain, nausea/vomiting, urinary incontinence, or other new/concerning symptom. Patient agreed with plan.    Darlin Priestly, MHS, PA-C Montey Hora, MHS, PA-C Advanced Practice Provider [facility]  [place], [place] [phone] (p): [phone] [email or web address]

## 2018-09-18 ENCOUNTER — Encounter: Payer: Self-pay | Admitting: Internal Medicine

## 2018-09-18 NOTE — Progress Notes (Signed)
Abstracted and sent to scan  

## 2018-09-20 ENCOUNTER — Telehealth: Payer: Self-pay

## 2018-09-20 NOTE — Telephone Encounter (Signed)
I was no able to speak with the patient.

## 2018-09-24 ENCOUNTER — Encounter (HOSPITAL_COMMUNITY): Payer: Self-pay | Admitting: Emergency Medicine

## 2018-09-24 ENCOUNTER — Encounter: Payer: Self-pay | Admitting: Emergency Medicine

## 2018-09-24 ENCOUNTER — Other Ambulatory Visit: Payer: Self-pay

## 2018-09-24 ENCOUNTER — Emergency Department (HOSPITAL_COMMUNITY)
Admission: EM | Admit: 2018-09-24 | Discharge: 2018-09-24 | Disposition: A | Discharge status: Home / Self Care | Payer: 59 | Attending: Emergency Medicine | Admitting: Emergency Medicine

## 2018-09-24 ENCOUNTER — Emergency Department (HOSPITAL_COMMUNITY): Payer: 59

## 2018-09-24 ENCOUNTER — Emergency Department
Admission: EM | Admit: 2018-09-24 | Discharge: 2018-09-25 | Disposition: A | Discharge status: Home / Self Care | Payer: 59 | Attending: Emergency Medicine | Admitting: Emergency Medicine

## 2018-09-24 DIAGNOSIS — E039 Hypothyroidism, unspecified: Secondary | ICD-10-CM | POA: Insufficient documentation

## 2018-09-24 DIAGNOSIS — N200 Calculus of kidney: Secondary | ICD-10-CM | POA: Insufficient documentation

## 2018-09-24 DIAGNOSIS — Z87891 Personal history of nicotine dependence: Secondary | ICD-10-CM | POA: Insufficient documentation

## 2018-09-24 DIAGNOSIS — J45909 Unspecified asthma, uncomplicated: Secondary | ICD-10-CM | POA: Diagnosis not present

## 2018-09-24 DIAGNOSIS — R319 Hematuria, unspecified: Secondary | ICD-10-CM | POA: Insufficient documentation

## 2018-09-24 DIAGNOSIS — Z79899 Other long term (current) drug therapy: Secondary | ICD-10-CM | POA: Diagnosis not present

## 2018-09-24 DIAGNOSIS — N1330 Unspecified hydronephrosis: Secondary | ICD-10-CM | POA: Diagnosis not present

## 2018-09-24 DIAGNOSIS — R109 Unspecified abdominal pain: Secondary | ICD-10-CM | POA: Diagnosis present

## 2018-09-24 DIAGNOSIS — R350 Frequency of micturition: Secondary | ICD-10-CM | POA: Diagnosis not present

## 2018-09-24 DIAGNOSIS — R300 Dysuria: Secondary | ICD-10-CM | POA: Insufficient documentation

## 2018-09-24 LAB — CBC WITH DIFFERENTIAL/PLATELET
Abs Immature Granulocytes: 0.01 10*3/uL (ref 0.00–0.07)
Abs Immature Granulocytes: 0.01 10*3/uL (ref 0.00–0.07)
Basophils Absolute: 0 10*3/uL (ref 0.0–0.1)
Basophils Absolute: 0 10*3/uL (ref 0.0–0.1)
Basophils Relative: 1 %
Basophils Relative: 1 %
Eosinophils Absolute: 0.1 10*3/uL (ref 0.0–0.5)
Eosinophils Absolute: 0.1 10*3/uL (ref 0.0–0.5)
Eosinophils Relative: 1 %
Eosinophils Relative: 1 %
HCT: 37 % (ref 36.0–46.0)
HCT: 40.8 % (ref 36.0–46.0)
Hemoglobin: 12.8 g/dL (ref 12.0–15.0)
Hemoglobin: 13.5 g/dL (ref 12.0–15.0)
Immature Granulocytes: 0 %
Immature Granulocytes: 0 %
Lymphocytes Relative: 25 %
Lymphocytes Relative: 29 %
Lymphs Abs: 1.1 10*3/uL (ref 0.7–4.0)
Lymphs Abs: 1.3 10*3/uL (ref 0.7–4.0)
MCH: 30.3 pg (ref 26.0–34.0)
MCH: 30.8 pg (ref 26.0–34.0)
MCHC: 33.1 g/dL (ref 30.0–36.0)
MCHC: 34.6 g/dL (ref 30.0–36.0)
MCV: 87.5 fL (ref 80.0–100.0)
MCV: 93.2 fL (ref 80.0–100.0)
Monocytes Absolute: 0.4 10*3/uL (ref 0.1–1.0)
Monocytes Absolute: 0.5 10*3/uL (ref 0.1–1.0)
Monocytes Relative: 10 %
Monocytes Relative: 11 %
Neutro Abs: 2.6 10*3/uL (ref 1.7–7.7)
Neutro Abs: 2.6 10*3/uL (ref 1.7–7.7)
Neutrophils Relative %: 58 %
Neutrophils Relative %: 63 %
Platelets: 198 10*3/uL (ref 150–400)
Platelets: 199 10*3/uL (ref 150–400)
RBC: 4.23 MIL/uL (ref 3.87–5.11)
RBC: 4.38 MIL/uL (ref 3.87–5.11)
RDW: 11.6 % (ref 11.5–15.5)
RDW: 11.9 % (ref 11.5–15.5)
WBC: 4.2 10*3/uL (ref 4.0–10.5)
WBC: 4.5 10*3/uL (ref 4.0–10.5)
nRBC: 0 % (ref 0.0–0.2)
nRBC: 0 % (ref 0.0–0.2)

## 2018-09-24 LAB — URINALYSIS, ROUTINE W REFLEX MICROSCOPIC
Bilirubin Urine: NEGATIVE
Glucose, UA: NEGATIVE mg/dL
Ketones, ur: NEGATIVE mg/dL
Leukocytes,Ua: NEGATIVE
Nitrite: NEGATIVE
Protein, ur: NEGATIVE mg/dL
Specific Gravity, Urine: 1.004 — ABNORMAL LOW (ref 1.005–1.030)
pH: 6 (ref 5.0–8.0)

## 2018-09-24 LAB — BASIC METABOLIC PANEL
Anion gap: 8 (ref 5–15)
BUN: 8 mg/dL (ref 6–20)
CO2: 26 mmol/L (ref 22–32)
Calcium: 9.3 mg/dL (ref 8.9–10.3)
Chloride: 106 mmol/L (ref 98–111)
Creatinine, Ser: 0.6 mg/dL (ref 0.44–1.00)
GFR calc Af Amer: >60 mL/min (ref >60)
GFR calc non Af Amer: >60 mL/min (ref >60)
Glucose, Bld: 86 mg/dL (ref 70–99)
Potassium: 4.1 mmol/L (ref 3.5–5.1)
Sodium: 140 mmol/L (ref 135–145)

## 2018-09-24 LAB — PREGNANCY, URINE: Preg Test, Ur: NEGATIVE

## 2018-09-24 LAB — POCT PREGNANCY, URINE: Preg Test, Ur: NEGATIVE

## 2018-09-24 MED ORDER — FENTANYL CITRATE (PF) 100 MCG/2ML IJ SOLN
50.0000 ug | Freq: Once | INTRAMUSCULAR | Status: AC
  Administered 2018-09-24: 50 ug via INTRAVENOUS
  Filled 2018-09-24: qty 2

## 2018-09-24 MED ORDER — KETOROLAC TROMETHAMINE 30 MG/ML IJ SOLN: 30.0000 mg | Freq: Once | INTRAMUSCULAR | Status: DC

## 2018-09-24 MED ORDER — KETOROLAC TROMETHAMINE 30 MG/ML IJ SOLN
30.0000 mg | Freq: Once | INTRAMUSCULAR | Status: AC
  Administered 2018-09-24: 30 mg via INTRAMUSCULAR
  Filled 2018-09-24: qty 1

## 2018-09-24 MED ORDER — ONDANSETRON HCL 4 MG/2ML IJ SOLN
4.0000 mg | Freq: Once | INTRAMUSCULAR | Status: AC
  Administered 2018-09-24: 4 mg via INTRAVENOUS
  Filled 2018-09-24: qty 2

## 2018-09-24 NOTE — ED Provider Notes (Signed)
[facility] DEPT Provider Note   CSN: [id number] Arrival date & time: 09/24/18  1144     History   Chief Complaint Chief Complaint  Patient presents with  . flank pain  . Dysuria    HPI Michelle Waller is a 28 y.o. female.     28 year old female with history of kidney stones presents with complaint of pain in her bladder.  Patient states that she thought she maybe has a kidney stone 1 week ago, went to [facility] and was found to have hematuria.  Patient was given Keflex but did not take this as her symptoms seem to improve after taking AZO.  Patient felt she was doing better until this morning around 7 AM had sudden onset of sharp pain near her bladder which lasted for about an hour and a half before easing off.  Reports occasional throbbing pain over her bladder or right pelvic areas.  Reports urinary frequency and dysuria.  Denies fevers or vomiting.  Denies vaginal discharge, denies possibility of STD, states not sexually active at this time. 1 prior diagnosis of chlamydia in 2016 which was treated at that time. LMP now. Reports prior ureteral stent on left for 33mm distal left stone.     Past Medical History:  Diagnosis Date  . Allergy   . Anxiety   . Asthma    physical induced asthma   . Eating disorder   . Frequent headaches   . Gastritis   . GERD (gastroesophageal reflux disease)   . Hiatal hernia   . IBS (irritable bowel syndrome)   . Kidney stones   . Ovarian cyst   . Thyroid disease   . UTI (urinary tract infection)   . Vision abnormalities     Patient Active Problem List   Diagnosis Date Noted  . Nonallopathic lesion of lumbosacral region 10/02/2017  . Nonallopathic lesion of sacral region 10/02/2017  . Irritable bowel syndrome 06/21/2017  . Routine general medical examination at a health care facility 06/14/2017  . 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 Nonallopathic lesion of thoracic region 04/30/2017  . Nonallopathic lesion of  rib cage 04/30/2017  . Nonallopathic lesion of cervical region 04/30/2017  . ANA positive 10/31/2016  . Anxiety 08/15/2016  . Hypothyroidism 02/03/2016  . GERD (gastroesophageal reflux disease) 02/11/2015    Past Surgical History:  Procedure Laterality Date  . ADENOIDECTOMY    . COLONOSCOPY    . CYSTOSCOPY    . KNEE SURGERY       OB History    Gravida  0   Para  0   Term  0   Preterm  0   AB  0   Living  0     SAB  0   TAB  0   Ectopic  0   Multiple  0   Live Births               Home Medications    Prior to Admission medications   Medication Sig Start Date End Date Taking? Authorizing Provider  albuterol (PROVENTIL HFA;VENTOLIN HFA) 108 (90 Base) MCG/ACT inhaler Inhale 2 puffs into the lungs every 4 (four) hours as needed for wheezing or shortness of breath. 11/19/17   Lyndal Pulley, DO  cholecalciferol (VITAMIN D3) 25 MCG (1000 UT) tablet Take 1,000 Units by mouth daily.    [provider]  Cyanocobalamin (VITAMIN B 12 PO) Take by mouth.    [provider]  diazepam (VALIUM) 10 MG  tablet Take 1 tablet (10 mg total) by mouth every 8 (eight) hours as needed for anxiety. 02/14/18   Darlin Priestly, PA-C  dicyclomine (BENTYL) 20 MG tablet Take 20 mg by mouth every 6 (six) hours.    [provider]  ferrous sulfate 325 (65 FE) MG tablet Take 325 mg by mouth daily with breakfast.    [provider]  levothyroxine (SYNTHROID) 50 MCG tablet TAKE 1 TABLET (50 MCG TOTAL) BY MOUTH DAILY BEFORE BREAKFAST. MUST KEEP SCHEDULED APPT FOR AUGUST 6 FOR FUTURE REFILLS 09/16/18   Hoyt Koch, MD  LORazepam (ATIVAN) 0.5 MG tablet Take 1 tablet (0.5 mg total) by mouth daily as needed for anxiety. 11/06/17   Hoyt Koch, MD    Family History Family History  Problem Relation Age of Onset  . Depression Mother   . Atrial fibrillation Mother   . Hypertension Father   . Colon polyps Father   . Arthritis Maternal  Grandmother   . Heart disease Maternal Grandmother   . Cancer Paternal Grandmother        breast  . Hypertension Paternal Grandmother   . Heart disease Paternal Grandfather   . Hypertension Paternal Grandfather   . Diabetes Paternal Grandfather   . Diabetes Mellitus II Sister   . Hypothyroidism Sister   . Colon cancer Neg Hx   . Esophageal cancer Neg Hx   . Rectal cancer Neg Hx   . Stomach cancer Neg Hx     Social History Social History   Tobacco Use  . Smoking status: Former Smoker    Quit date: 11/09/2011    Years since quitting: 6.8  . Smokeless tobacco: Never Used  Substance Use Topics  . Alcohol use: Yes    Alcohol/week: 0.0 standard drinks    Comment: occasionally   . Drug use: No     Allergies   Soy allergy   Review of Systems Review of Systems  Constitutional: Negative for fever.  Gastrointestinal: Positive for abdominal pain and nausea. Negative for vomiting.  Genitourinary: Positive for dysuria, frequency and pelvic pain. Negative for difficulty urinating and vaginal discharge.  Musculoskeletal: Negative for back pain and myalgias.  Skin: Negative for rash and wound.  Allergic/Immunologic: Negative for immunocompromised state.  Neurological: Negative for weakness.  Psychiatric/Behavioral: Negative for confusion.  All other systems reviewed and are negative.    Physical Exam Updated Vital Signs BP 137/89 (BP Location: Right Arm)   Pulse 72   Temp 99 F (37.2 C) (Oral)   Resp 16   LMP 09/23/2018   SpO2 100%   Physical Exam Vitals signs and nursing note reviewed.  Constitutional:      General: She is not in acute distress.    Appearance: She is well-developed. She is not diaphoretic.  HENT:     Head: Normocephalic and atraumatic.  Cardiovascular:     Rate and Rhythm: Normal rate and regular rhythm.     Pulses: Normal pulses.     Heart sounds: Normal heart sounds.  Pulmonary:     Effort: Pulmonary effort is normal.     Breath sounds:  Normal breath sounds.  Abdominal:     Palpations: Abdomen is soft.     Tenderness: There is no abdominal tenderness. There is no right CVA tenderness or left CVA tenderness.  Skin:    General: Skin is warm and dry.     Findings: No erythema or rash.  Neurological:     Mental Status: She is alert and  oriented to person, place, and time.  Psychiatric:        Behavior: Behavior normal.      ED Treatments / Results  Labs (all labs ordered are listed, but only abnormal results are displayed) Labs Reviewed  URINALYSIS, ROUTINE W REFLEX MICROSCOPIC - Abnormal; Notable for the following components:      Result Value   Color, Urine STRAW (*)    Specific Gravity, Urine 1.004 (*)    Hgb urine dipstick LARGE (*)    Bacteria, UA FEW (*)    All other components within normal limits  PREGNANCY, URINE  BASIC METABOLIC PANEL  CBC WITH DIFFERENTIAL/PLATELET    EKG None  Radiology US Renal  Result Date: 09/24/2018 CLINICAL DATA:  Right flank pain.  History of kidney stones. EXAM: RENAL / URINARY TRACT ULTRASOUND COMPLETE COMPARISON:  None. FINDINGS: Right Kidney: Renal measurements: 10.1 x 4.7 x 4.3 cm = volume: 107 mL . Echogenicity within normal limits. No mass or hydronephrosis visualized. 1.8 mm stone in the mid right kidney. Left Kidney: Renal measurements: 10.8 x 4.9 x 4.5 cm = volume: 124 mL. Echogenicity within normal limits. No mass or hydronephrosis visualized. 4.4 mm stone in the left upper pole. Bladder: Minimal urine in the bladder, not adequate for assessment. IMPRESSION: Small bilateral renal calculi.  No  hydronephrosis. Electronically Signed   By: Lorriane Shire M.D.   On: 09/24/2018 17:51    Procedures Procedures (including critical care time)  Medications Ordered in ED Medications  ketorolac (TORADOL) 30 MG/ML injection 30 mg (30 mg Intramuscular Given 09/24/18 1809)     Initial Impression / Assessment and Plan / ED Course  I have reviewed the triage vital signs and the  nursing notes.  Pertinent labs & imaging results that were available during my care of the patient were reviewed by me and considered in my medical decision making (see chart for details).  Clinical Course as of Sep 24 1819  Tue Sep 23, 6345  321 28 year old female presents with complaint of dysuria, frequency, sharp pain in the bladder today.  [facility], had a hematuria and was given Keflex however did not take the Keflex as she took AZO and her symptoms resolved before returning today.  Patient is a history of kidney stones, is questioning if she has a kidney stone or UTI, denies vaginal discharge.  On exam, abdomen soft nontender, no CVA tenderness, patient is well-appearing.  CBC and BMP are unremarkable, normal renal function.  Urinalysis is positive for large hemoglobin without other indicators of UTI, pregnancy test negative.  Renal ultrasound shows small intrarenal stones, no hydro.  Discussed results with patient, patient be given injection of Toradol prior to discharge with plan for follow-up with PCP or her urologist.  Return to ER for fevers or worsening symptoms.   [LM]    Clinical Course User Index [LM] Tacy Learn, PA-C      Final Clinical Impressions(s) / ED Diagnoses   Final diagnoses:  Hematuria, unspecified type    ED Discharge Orders    None       Tacy Learn, PA-C 09/24/18 1821    Valarie Merino, MD 09/27/18 [phone]

## 2018-09-24 NOTE — ED Triage Notes (Addendum)
Pt to triage via w/c, crying, appears uncomfortable; c/o rt flank pain radiating into abd since last Wednesday, now with increased pain, hx kidney stones; toradol taken at 615pm at [facility] ED

## 2018-09-24 NOTE — ED Notes (Signed)
Patient urine and urine culture sent to main lab.

## 2018-09-24 NOTE — ED Provider Notes (Signed)
[facility] Emergency Department Provider Note  ____________________________________________   First MD Initiated Contact with Patient 09/24/18 2339     (approximate)  I have reviewed the triage vital signs and the nursing notes.   HISTORY  Chief Complaint Flank Pain    HPI Michelle Waller is a 28 y.o. female with IBS, hiatal hernia, kidney stones who presents with flank pain.  Patient is currently menstruating.  Patient endorses intermittent pain for the past few days in her right flank with pain that radiates down into her suprapubic region that is intermittent, nothing brings it on, better with drinking fluids, nothing makes it worse.  She says this feels very similar to her prior kidney stones.  Patient works in radiology.  Patient would like a CT scan to evaluate for the size of the stone given she has need prior interventions of the stone.  Patient did have a ultrasound did not show any evidence of hydronephrosis.  Patient denies history of fevers.          Past Medical History:  Diagnosis Date   Allergy    Anxiety    Asthma    physical induced asthma    Eating disorder    Frequent headaches    Gastritis    GERD (gastroesophageal reflux disease)    Hiatal hernia    IBS (irritable bowel syndrome)    Kidney stones    Ovarian cyst    Thyroid disease    UTI (urinary tract infection)    Vision abnormalities     Patient Active Problem List   Diagnosis Date Noted   Nonallopathic lesion of lumbosacral region 10/02/2017   Nonallopathic lesion of sacral region 10/02/2017   Irritable bowel syndrome 06/21/2017   Routine general medical examination at a health care facility 06/14/2017   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 Authorizing Provider  albuterol (PROVENTIL HFA;VENTOLIN HFA) 108 (90 Base) MCG/ACT inhaler Inhale 2 puffs into the lungs every 4 (four) hours as needed for wheezing or shortness of breath. 11/19/17   Lyndal Pulley, DO  cholecalciferol (VITAMIN D3) 25 MCG (1000 UT) tablet Take 1,000 Units by mouth daily.    [provider]  Cyanocobalamin (VITAMIN B 12 PO) Take by mouth.    [provider]  diazepam (VALIUM) 10 MG tablet Take 1 tablet (10 mg total) by mouth every 8 (eight) hours as needed for anxiety. 02/14/18   Darlin Priestly, PA-C  dicyclomine (BENTYL) 20 MG tablet Take 20 mg by mouth every 6 (six) hours.    [provider]  ferrous sulfate 325 (65 FE) MG tablet Take 325 mg by mouth daily with breakfast.    [provider]  levothyroxine (SYNTHROID) 50 MCG tablet TAKE 1 TABLET (50 MCG TOTAL) BY MOUTH DAILY BEFORE BREAKFAST. MUST KEEP SCHEDULED APPT FOR AUGUST 6 FOR FUTURE REFILLS 09/16/18   Hoyt Koch, MD  LORazepam (ATIVAN) 0.5 MG tablet Take 1 tablet (0.5 mg total) by mouth  daily as needed for anxiety. 11/06/17   Hoyt Koch, MD    Allergies Soy allergy  Family History  Problem Relation Age of Onset   Depression Mother    Atrial fibrillation Mother    Hypertension Father    Colon polyps Father    Arthritis Maternal Grandmother    Heart disease Maternal Grandmother    Cancer Paternal Grandmother        breast   Hypertension Paternal Grandmother    Heart disease Paternal Grandfather    Hypertension Paternal Grandfather    Diabetes Paternal Grandfather    Diabetes Mellitus II Sister    Hypothyroidism Sister    Colon cancer  Neg Hx    Esophageal cancer Neg Hx    Rectal cancer Neg Hx    Stomach cancer Neg Hx     Social History Social History   Tobacco Use   Smoking status: Former Smoker    Quit date: 11/09/2011    Years since quitting: 6.8   Smokeless tobacco: Never Used  Substance Use Topics   Alcohol use: Yes    Alcohol/week: 0.0 standard drinks    Comment: occasionally    Drug use: No      Review of Systems Constitutional: No fever/chills Eyes: No visual changes. ENT: No sore throat. Cardiovascular: Denies chest pain. Respiratory: Denies shortness of breath. Gastrointestinal: No abdominal pain.  No nausea, no vomiting.  No diarrhea.  No constipation. Genitourinary: Right flank pain, suprapubic pain. Musculoskeletal: Negative for back pain. Skin: Negative for rash. Neurological: Negative for headaches, focal weakness or numbness. All other ROS negative ____________________________________________   PHYSICAL EXAM:  VITAL SIGNS: ED Triage Vitals  Enc Vitals Group     BP 09/24/18 2307 130/77     Pulse Rate 09/24/18 2307 86     Resp 09/24/18 2307 (!) 24     Temp 09/24/18 2307 98.2 F (36.8 C)     Temp Source 09/24/18 2307 Oral     SpO2 09/24/18 2307 99 %     Weight 09/24/18 2308 145 lb (65.8 kg)     Height 09/24/18 2308 5\' 2"  (1.575 m)     Head Circumference --      Peak Flow --      Pain Score 09/24/18 2307 9     Pain Loc --      Pain Edu? --      Excl. in [place]? --     Constitutional: Alert and oriented. Well appearing and in no acute distress. Eyes: Conjunctivae are normal. EOMI. Head: Atraumatic. Nose: No congestion/rhinnorhea. Mouth/Throat: Mucous membranes are moist.   Neck: No stridor. Trachea Midline. FROM Cardiovascular: Normal rate, regular rhythm. Grossly normal heart sounds.  Good peripheral circulation. Respiratory: Normal respiratory effort.  No retractions. Lungs CTAB. Gastrointestinal: Soft and nontender. No distention. No abdominal bruits.    Musculoskeletal: No lower extremity tenderness nor edema.  No joint effusions. Neurologic:  Normal speech and language. No gross focal neurologic deficits are appreciated.  Skin:  Skin is warm, dry and intact. No rash noted. Psychiatric: Mood and affect are normal. Speech and behavior are normal. GU: Mild suprapubic tenderness.  ____________________________________________   LABS (all labs ordered are listed, but only abnormal results are displayed)  Labs Reviewed  COMPREHENSIVE METABOLIC PANEL - Abnormal; Notable for the following components:      Result Value   Sodium 133 (*)    Potassium 3.1 (*)    All other components within normal limits  CBC WITH DIFFERENTIAL/PLATELET  LIPASE, BLOOD  URINALYSIS, ROUTINE W REFLEX MICROSCOPIC  POC URINE PREG, ED  POCT PREGNANCY, URINE   ____________________________________________   ED ECG REPORT I, Vanessa [place], the attending physician, personally viewed and interpreted this ECG.  EKG is normal sinus rate of 70 with sinus arrhythmia, no ST elevation, no T wave inversion, normal intervals ____________________________________________  RADIOLOGY   Official radiology report(s): US Renal  Result Date: 09/24/2018 CLINICAL DATA:  Right flank pain.  History of kidney stones. EXAM: RENAL / URINARY TRACT ULTRASOUND COMPLETE COMPARISON:  None. FINDINGS: Right Kidney: Renal measurements: 10.1 x 4.7 x 4.3 cm = volume: 107 mL . Echogenicity within normal limits. No mass or hydronephrosis visualized. 1.8 mm stone in the mid right kidney. Left Kidney: Renal measurements: 10.8 x 4.9 x 4.5 cm = volume: 124 mL. Echogenicity within normal limits. No mass or hydronephrosis visualized. 4.4 mm stone in the left upper pole. Bladder: Minimal urine in the bladder, not adequate for assessment. IMPRESSION: Small bilateral renal calculi.  No  hydronephrosis. Electronically Signed   By: Lorriane Shire M.D.   On: 09/24/2018 17:51   Ct Renal Stone Study  Result Date:  09/25/2018 CLINICAL DATA:  Right flank pain. EXAM: CT ABDOMEN AND PELVIS WITHOUT CONTRAST TECHNIQUE: Multidetector CT imaging of the abdomen and pelvis was performed following the standard protocol without IV contrast. COMPARISON:  Renal ultrasound yesterday. Abdominal CT 08/15/2014 FINDINGS: Lower chest: Lung bases are clear. Hepatobiliary: No focal liver abnormality is seen. No gallstones, gallbladder wall thickening, or biliary dilatation. Pancreas: No ductal dilatation or inflammation. Spleen: Normal in size without focal abnormality. Adrenals/Urinary Tract: Normal adrenal glands. Mild right hydroureteronephrosis, no ureteral stones are visualized. Multiple small, least 5, nonobstructing stones in the right kidney. No significant perinephric edema. No left hydronephrosis. Multiple, at least 4, nonobstructing stones in the left kidney. Left ureter is decompressed without stone along the course. Urinary bladder is physiologically distended. No bladder stone. No bladder wall thickening. Stomach/Bowel: Stomach is within normal limits. Appendix appears normal, for example series 5, image 48. No evidence of bowel wall thickening, distention, or inflammatory changes. Vascular/Lymphatic: Abdominal aorta is normal in caliber. Cannot assess vascular patency in the absence of IV contrast. No bulky abdominopelvic adenopathy. Reproductive: Uterus and bilateral adnexa are unremarkable. Other: No free air, free fluid, or intra-abdominal fluid collection. Musculoskeletal: There are no acute or suspicious osseous abnormalities. IMPRESSION: 1. Mild right hydroureteronephrosis without obstructing ureteral stone. This may be secondary to recently passed stone or urinary tract infection. 2. Multiple bilateral nonobstructing renal calculi. Electronically Signed   By: Keith Rake M.D.   On: 09/25/2018 00:52    ____________________________________________   PROCEDURES  Procedure(s) performed (including Critical  Care):  Procedures   ____________________________________________   INITIAL IMPRESSION / ASSESSMENT AND PLAN / ED COURSE  Matalyn Hook was evaluated in Emergency Department on 09/24/2018 for the symptoms described in the history of present illness. She was evaluated in the context of the global COVID-19 pandemic, which necessitated consideration that the patient might be at risk for infection with the SARS-CoV-2 virus that causes COVID-19. Institutional protocols and algorithms that pertain to the evaluation of patients at risk for COVID-19 are in a state of rapid change based on information released by regulatory bodies including the CDC and federal and state organizations. These policies and algorithms were followed during the patient's care in the ED.    Patient is a well-appearing 28 year old who presents with concerns for kidney stone.  Patient states this feels very  similar to prior kidney stones.  Would like a CT scan to evaluate the size of the kidney stone.  No evidence of hydronephrosis per early [place] or septic stone.  Patient understands that she would need to follow-up outpatient as long as her pain is well controlled and she not having vomiting.  Patient denies any right pelvic left pelvic pain to suggest ovarian torsion, ovarian cyst.  Will get pregnancy test to rule out ectopic.   No pregnancy.  Urine without evidence of UTI.  Negative white count.  Normal kidney function.  CT scan with mild right hydro-ureter nephrosis possibly secondary to recently passing a stone.  1:16 AM reevaluated patient.  Patient says her pain is now prematurely.  She does have a little bit of bladder spasming.  Will prescribe short course of oxybutynin.  Patient feels comfortable with being discharged home.  She understands return cautions.  I discussed the provisional nature of ED diagnosis, the treatment so far, the ongoing plan of care, follow up appointments and return precautions with the patient and  any family or support people present. They expressed understanding and agreed with the plan, discharged home.  ____________________________________________   FINAL CLINICAL IMPRESSION(S) / ED DIAGNOSES   Final diagnoses:  Kidney stone      MEDICATIONS GIVEN DURING THIS VISIT:  Medications  fentaNYL (SUBLIMAZE) injection 50 mcg (50 mcg Intravenous Given 09/24/18 2340)  ondansetron (ZOFRAN) injection 4 mg (4 mg Intravenous Given 09/24/18 2340)     ED Discharge Orders         Ordered    oxybutynin (DITROPAN) 5 MG tablet  Every 8 hours PRN     09/25/18 0119           Note:  This document was prepared using Dragon voice recognition software and may include unintentional dictation errors.   Vanessa [place], MD 09/25/18 [phone]

## 2018-09-24 NOTE — Discharge Instructions (Addendum)
Follow up with your PCP or urologist if symptoms persist. Return to ER for fevers, worsening or concerning symptoms.

## 2018-09-24 NOTE — ED Triage Notes (Signed)
Pt having right flank pains for couple days and had blood urine. Pains now loved lower into bladder.

## 2018-09-25 ENCOUNTER — Emergency Department: Payer: 59

## 2018-09-25 DIAGNOSIS — N200 Calculus of kidney: Secondary | ICD-10-CM | POA: Diagnosis not present

## 2018-09-25 DIAGNOSIS — Z79899 Other long term (current) drug therapy: Secondary | ICD-10-CM | POA: Diagnosis not present

## 2018-09-25 DIAGNOSIS — J45909 Unspecified asthma, uncomplicated: Secondary | ICD-10-CM | POA: Diagnosis not present

## 2018-09-25 DIAGNOSIS — Z87891 Personal history of nicotine dependence: Secondary | ICD-10-CM | POA: Diagnosis not present

## 2018-09-25 DIAGNOSIS — E039 Hypothyroidism, unspecified: Secondary | ICD-10-CM | POA: Diagnosis not present

## 2018-09-25 DIAGNOSIS — N1330 Unspecified hydronephrosis: Secondary | ICD-10-CM | POA: Diagnosis not present

## 2018-09-25 LAB — URINALYSIS, ROUTINE W REFLEX MICROSCOPIC
Bilirubin Urine: NEGATIVE
Glucose, UA: NEGATIVE mg/dL
Ketones, ur: NEGATIVE mg/dL
Leukocytes,Ua: NEGATIVE
Nitrite: NEGATIVE
Protein, ur: NEGATIVE mg/dL
Specific Gravity, Urine: 1.005 (ref 1.005–1.030)
pH: 7 (ref 5.0–8.0)

## 2018-09-25 LAB — COMPREHENSIVE METABOLIC PANEL
ALT: 18 U/L (ref 0–44)
AST: 24 U/L (ref 15–41)
Albumin: 4.5 g/dL (ref 3.5–5.0)
Alkaline Phosphatase: 73 U/L (ref 38–126)
Anion gap: 11 (ref 5–15)
BUN: 12 mg/dL (ref 6–20)
CO2: 22 mmol/L (ref 22–32)
Calcium: 9.2 mg/dL (ref 8.9–10.3)
Chloride: 100 mmol/L (ref 98–111)
Creatinine, Ser: 0.64 mg/dL (ref 0.44–1.00)
GFR calc Af Amer: >60 mL/min (ref >60)
GFR calc non Af Amer: >60 mL/min (ref >60)
Glucose, Bld: 93 mg/dL (ref 70–99)
Potassium: 3.1 mmol/L — ABNORMAL LOW (ref 3.5–5.1)
Sodium: 133 mmol/L — ABNORMAL LOW (ref 135–145)
Total Bilirubin: 0.6 mg/dL (ref 0.3–1.2)
Total Protein: 7.5 g/dL (ref 6.5–8.1)

## 2018-09-25 LAB — LIPASE, BLOOD: Lipase: 34 U/L (ref 11–51)

## 2018-09-25 MED ORDER — OXYBUTYNIN CHLORIDE 5 MG PO TABS: 5.0000 mg | ORAL_TABLET | Freq: Three times a day (TID) | ORAL | 0 refills | Status: AC | PRN

## 2018-09-25 MED ORDER — SODIUM CHLORIDE 0.9 % IV BOLUS
1000.0000 mL | Freq: Once | INTRAVENOUS | Status: AC
  Administered 2018-09-25: 1000 mL via INTRAVENOUS

## 2018-09-25 NOTE — Discharge Instructions (Signed)
We prescribed oxybutynin to try to help with some of the bladder spasming.  Looks like you already passed a kidney stone.  I given you urology's number if you need to follow-up for these recurrent kidney stones to see there is anything else that can be done.  IMPRESSION: 1. Mild right hydroureteronephrosis without obstructing ureteral stone. This may be secondary to recently passed stone or urinary tract infection. 2. Multiple bilateral nonobstructing renal calculi.

## 2018-09-26 DIAGNOSIS — N200 Calculus of kidney: Secondary | ICD-10-CM | POA: Diagnosis not present

## 2018-09-26 DIAGNOSIS — R1084 Generalized abdominal pain: Secondary | ICD-10-CM | POA: Diagnosis not present

## 2018-10-02 ENCOUNTER — Encounter: Payer: Self-pay | Admitting: Internal Medicine

## 2018-10-08 DIAGNOSIS — R87610 Atypical squamous cells of undetermined significance on cytologic smear of cervix (ASC-US): Secondary | ICD-10-CM | POA: Diagnosis not present

## 2018-10-08 DIAGNOSIS — Z113 Encounter for screening for infections with a predominantly sexual mode of transmission: Secondary | ICD-10-CM | POA: Diagnosis not present

## 2018-10-08 DIAGNOSIS — Z01419 Encounter for gynecological examination (general) (routine) without abnormal findings: Secondary | ICD-10-CM | POA: Diagnosis not present

## 2018-10-08 DIAGNOSIS — Z124 Encounter for screening for malignant neoplasm of cervix: Secondary | ICD-10-CM | POA: Diagnosis not present

## 2018-10-10 ENCOUNTER — Encounter: Payer: Self-pay | Admitting: Internal Medicine

## 2018-10-10 ENCOUNTER — Ambulatory Visit (INDEPENDENT_AMBULATORY_CARE_PROVIDER_SITE_OTHER): Payer: 59 | Admitting: Internal Medicine

## 2018-10-10 DIAGNOSIS — N200 Calculus of kidney: Secondary | ICD-10-CM | POA: Diagnosis not present

## 2018-10-10 NOTE — Progress Notes (Signed)
Virtual Visit via Video Note  I connected with Michelle Waller on 10/10/18 at 11:00 AM EDT by a video enabled telemedicine application and verified that I am speaking with the correct person using two identifiers.  The patient and the provider were at separate locations throughout the entire encounter.   I discussed the limitations of evaluation and management by telemedicine and the availability of in person appointments. The patient expressed understanding and agreed to proceed.  History of Present Illness: The patient is a 28 y.o. female with visit for kidney stones. Went to ER several weeks ago. She is not currently having pain. She started exercising a few weeks prior to this and has not been able to drink as much liquids due to mask requirement due to pandemic. Has had kidney stones before. Her work is requesting FMLA in case she has another flare. Has resolution of symptoms now. Sees urology as well. Denies fevers or chills or pain. Some bladder irritation but was checked for infection which was negative. Overall it is improving. Has tried medication as prescribed by the ER for pain.   Observations/Objective: Appearance: normal, breathing appears normal, casual grooming, abdomen does not appear distended, throat normal, memory normal, mental status is A and O times 3, no flank pain to self palpation  Assessment and Plan: See problem oriented charting  Follow Up Instructions: drink plenty of fluids, FMLA papers filled out to cover absence and any further due to kidney stones  I discussed the assessment and treatment plan with the patient. The patient was provided an opportunity to ask questions and all were answered. The patient agreed with the plan and demonstrated an understanding of the instructions.   The patient was advised to call back or seek an in-person evaluation if the symptoms worsen or if the condition fails to improve as anticipated.  Hoyt Koch, MD

## 2018-10-10 NOTE — Assessment & Plan Note (Signed)
Likely related to poor fluid intake due to mask requirement. Advised to increase fluid consumption to help avoid further flare. She has had kidney stones for some years and does rarely pass a stone. She knows how to get care for stone. FMLA filled out to excuse absence from kidney stone and cover in case she passes another soon.

## 2018-10-16 DIAGNOSIS — F331 Major depressive disorder, recurrent, moderate: Secondary | ICD-10-CM | POA: Diagnosis not present

## 2018-10-16 DIAGNOSIS — F4312 Post-traumatic stress disorder, chronic: Secondary | ICD-10-CM | POA: Diagnosis not present

## 2018-10-16 DIAGNOSIS — F411 Generalized anxiety disorder: Secondary | ICD-10-CM | POA: Diagnosis not present

## 2018-10-22 ENCOUNTER — Other Ambulatory Visit: Payer: Self-pay

## 2018-10-22 DIAGNOSIS — I83892 Varicose veins of left lower extremities with other complications: Secondary | ICD-10-CM

## 2018-10-23 ENCOUNTER — Encounter: Payer: Self-pay | Admitting: Family Medicine

## 2018-10-23 ENCOUNTER — Ambulatory Visit (INDEPENDENT_AMBULATORY_CARE_PROVIDER_SITE_OTHER): Payer: 59 | Admitting: Family Medicine

## 2018-10-23 ENCOUNTER — Other Ambulatory Visit: Payer: Self-pay

## 2018-10-23 VITALS — BP 90/70 | HR 68 | Ht 62.0 in | Wt 148.0 lb

## 2018-10-23 DIAGNOSIS — M999 Biomechanical lesion, unspecified: Secondary | ICD-10-CM

## 2018-10-23 DIAGNOSIS — M940 Chondrocostal junction syndrome [Tietze]: Secondary | ICD-10-CM | POA: Diagnosis not present

## 2018-10-23 NOTE — Assessment & Plan Note (Signed)
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  Responded well to osteopathic manipulation.  We discussed ergonomics at work such as decreasing the flexion at the hips on a regular basis.  Discussed lumbar support, different lifting mechanics.  Follow-up again in 4 to 6 weeks.

## 2018-10-23 NOTE — Patient Instructions (Signed)
Bring seat up and try more lumbar support See me again in 4-5 weeks

## 2018-10-23 NOTE — Assessment & Plan Note (Signed)
Decision today to treat with OMT was based on Physical Exam  After verbal consent patient was treated with HVLA, ME, FPR techniques in cervical, thoracic, rib lumbar and sacral areas  Patient tolerated the procedure well with improvement in symptoms  Patient given exercises, stretches and lifestyle modifications  See medications in patient instructions if given  Patient will follow up in 4-5 weeks 

## 2018-10-23 NOTE — Progress Notes (Signed)
[name] [place],  [address] Phone: [phone] Subjective:   I Michelle Waller am serving as a Education administrator for [name].   CC: Back pain follow-up  [id number]  Michelle Waller is a 28 y.o. female coming in with complaint of back pain. Patient has been having pain and states it is time for her adjustment.  Patient states that did do some lifting of a heavy patient recently.  Had significant pain almost immediately.  This was 1 week ago.  No radiation of the legs or any of the other extremities.  Discussed is having some mild increasing discomfort and what she is normally used to.  Does not feel like she is got back to baseline in quite some time.      Past Medical History:  Diagnosis Date  . Allergy   . Anxiety   . Asthma    physical induced asthma   . Eating disorder   . Frequent headaches   . Gastritis   . GERD (gastroesophageal reflux disease)   . Hiatal hernia   . IBS (irritable bowel syndrome)   . Kidney stones   . Ovarian cyst   . Thyroid disease   . UTI (urinary tract infection)   . Vision abnormalities    Past Surgical History:  Procedure Laterality Date  . ADENOIDECTOMY    . COLONOSCOPY    . CYSTOSCOPY    . KNEE SURGERY     Social History   Socioeconomic History  . Marital status: Single    Spouse name: Not on file  . Number of children: Not on file  . Years of education: Not on file  . Highest education level: Not on file  Occupational History  . Not on file  Social Needs  . Financial resource strain: Not on file  . Food insecurity    Worry: Not on file    Inability: Not on file  . Transportation needs    Medical: Not on file    Non-medical: Not on file  Tobacco Use  . Smoking status: Former Smoker    Quit date: 11/09/2011    Years since quitting: 6.9  . Smokeless tobacco: Never Used  Substance and Sexual Activity  . Alcohol use: Yes    Alcohol/week: 0.0 standard drinks    Comment:  occasionally   . Drug use: No  . Sexual activity: Not on file  Lifestyle  . Physical activity    Days per week: Not on file    Minutes per session: Not on file  . Stress: Not on file  Relationships  . Social Herbalist on phone: Not on file    Gets together: Not on file    Attends religious service: Not on file    Active member of club or organization: Not on file    Attends meetings of clubs or organizations: Not on file    Relationship status: Not on file  Other Topics Concern  . Not on file  Social History Narrative  . Not on file   Allergies  Allergen Reactions  . Soy Allergy Anaphylaxis    Large amounts cause anaphylaxis Small amounts cause upset stomach   Family History  Problem Relation Age of Onset  . Depression Mother   . Atrial fibrillation Mother   . Hypertension Father   . Colon polyps Father   . Arthritis Maternal Grandmother   . Heart disease Maternal Grandmother   . Cancer  Paternal Grandmother        breast  . Hypertension Paternal Grandmother   . Heart disease Paternal Grandfather   . Hypertension Paternal Grandfather   . Diabetes Paternal Grandfather   . Diabetes Mellitus II Sister   . Hypothyroidism Sister   . Colon cancer Neg Hx   . Esophageal cancer Neg Hx   . Rectal cancer Neg Hx   . Stomach cancer Neg Hx     Current Outpatient Medications (Endocrine & Metabolic):  .  levothyroxine (SYNTHROID) 50 MCG tablet, TAKE 1 TABLET (50 MCG TOTAL) BY MOUTH DAILY BEFORE BREAKFAST. MUST KEEP SCHEDULED APPT FOR AUGUST 6 FOR FUTURE REFILLS   Current Outpatient Medications (Respiratory):  .  albuterol (PROVENTIL HFA;VENTOLIN HFA) 108 (90 Base) MCG/ACT inhaler, Inhale 2 puffs into the lungs every 4 (four) hours as needed for wheezing or shortness of breath.   Current Outpatient Medications (Hematological):  Marland Kitchen  Cyanocobalamin (VITAMIN B 12 PO), Take by mouth. .  ferrous sulfate 325 (65 FE) MG tablet, Take 325 mg by mouth daily with breakfast.   Current Outpatient Medications (Other):  .  cholecalciferol (VITAMIN D3) 25 MCG (1000 UT) tablet, Take 1,000 Units by mouth daily. .  diazepam (VALIUM) 10 MG tablet, Take 1 tablet (10 mg total) by mouth every 8 (eight) hours as needed for anxiety. .  dicyclomine (BENTYL) 20 MG tablet, Take 20 mg by mouth every 6 (six) hours. Marland Kitchen  LORazepam (ATIVAN) 0.5 MG tablet, Take 1 tablet (0.5 mg total) by mouth daily as needed for anxiety.    Past medical history, social, surgical and family history all reviewed in electronic medical record.  No pertanent information unless stated regarding to the chief complaint.   Review of Systems:  No headache, visual changes, nausea, vomiting, diarrhea, constipation, dizziness, abdominal pain, skin rash, fevers, chills, night sweats, weight loss, swollen lymph nodes, body aches, joint swelling, muscle aches, chest pain, shortness of breath, mood changes.   Objective  Blood pressure 90/70, pulse 68, height 5\' 2"  (1.575 m), weight 148 lb (67.1 kg), last menstrual period 09/23/2018, SpO2 98 %.    General: No apparent distress alert and oriented x3 mood and affect normal, dressed appropriately.  HEENT: Pupils equal, extraocular movements intact  Respiratory: Patient's speak in full sentences and does not appear short of breath  Cardiovascular: No lower extremity edema, non tender, no erythema  Skin: Warm dry intact with no signs of infection or rash on extremities or on axial skeleton.  Abdomen: Soft nontender  Neuro: Cranial nerves II through XII are intact, neurovascularly intact in all extremities with 2+ DTRs and 2+ pulses.  Lymph: No lymphadenopathy of posterior or anterior cervical chain or axillae bilaterally.  Gait normal with good balance and coordination.  MSK:  Non tender with full range of motion and good stability and symmetric strength and tone of shoulders, elbows, wrist, hip, knee and ankles bilaterally.  Back exam shows some loss of lordosis.  Less  tightness in the thoracolumbar juncture as well as the parascapular region right greater than left.  Tenderness over the right sacroiliac joint diffusely along with a positive [place]y Sox.  Negative straight leg test.  Mild tightness of the hamstrings bilaterally though.  Osteopathic findings C6 flexed rotated and side bent left T3 extended rotated and side bent right inhaled third rib T9 extended rotated and side bent left L2 flexed rotated and side bent right Sacrum right on right    Impression and Recommendations:     This case  required medical decision making of moderate complexity. The above documentation has been reviewed and is accurate and complete Lyndal Pulley, DO       Note: This dictation was prepared with Dragon dictation along with smaller phrase technology. Any transcriptional errors that result from this process are unintentional.

## 2018-10-24 ENCOUNTER — Other Ambulatory Visit: Payer: Self-pay

## 2018-10-24 ENCOUNTER — Ambulatory Visit: Payer: 59 | Admitting: Vascular Surgery

## 2018-10-24 ENCOUNTER — Ambulatory Visit (HOSPITAL_COMMUNITY)
Admission: RE | Admit: 2018-10-24 | Discharge: 2018-10-24 | Disposition: A | Discharge status: Home / Self Care | Payer: 59 | Source: Ambulatory Visit | Attending: Family | Admitting: Family

## 2018-10-24 ENCOUNTER — Encounter: Payer: Self-pay | Admitting: Vascular Surgery

## 2018-10-24 VITALS — BP 110/73 | HR 100 | Temp 97.9°F | Resp 14 | Ht 62.0 in | Wt 145.0 lb

## 2018-10-24 DIAGNOSIS — I83812 Varicose veins of left lower extremities with pain: Secondary | ICD-10-CM

## 2018-10-24 DIAGNOSIS — I83892 Varicose veins of left lower extremities with other complications: Secondary | ICD-10-CM | POA: Diagnosis not present

## 2018-10-24 NOTE — Progress Notes (Signed)
REASON FOR CONSULT:    Painful varicose veins left lower extremity.  Self-referred  ASSESSMENT & PLAN:   PAINFUL VARICOSE VEINS LEFT LOWER EXTREMITY: This patient does have superficial venous reflux involving the left great saphenous vein and short saphenous vein.  However the veins are not especially dilated.  I have discussed with her the importance of intermittent leg elevation and the proper positioning for this.  I have written her a prescription for knee-high compression stockings with a gradient of 15 to 20 mmHg.  I encouraged her to wear these at work.  We discussed the importance of exercise specifically walking and water aerobics.  She is very active.  I have encouraged her to avoid prolonged sitting and standing.  If her symptoms or varicose veins progress in the future that I be happy to see her back at any time.  Deitra Mayo, MD, FACS Beeper [phone] Office: [phone]   HPI:   Michelle Waller is a pleasant 28 y.o. female, who presents with painful varicose veins of the left lower extremity.  She states they began several years ago and believes they resulted from a soccer injury.  She developed some varicose veins in her distal medial left thigh.  She describes aching pain and heaviness in her left leg which is aggravated by standing and sitting and relieved with elevation.  She has been wearing some knee-high compression stockings with a gradient of 15 to 20 mmHg.  She has not required ibuprofen for pain.  She has had some hematomas and injuries from soccer but no documented DVT that I can tell.  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  Past Medical History:  Diagnosis Date  . Allergy   . Anxiety   . Asthma    physical induced asthma   . Eating disorder   . Frequent headaches   . Gastritis   . GERD (gastroesophageal reflux disease)   . Hiatal hernia   . IBS (irritable bowel syndrome)   . Kidney stones   . Ovarian cyst   .  Thyroid disease   . UTI (urinary tract infection)   . Vision abnormalities     Family History  Problem Relation Age of Onset  . Depression Mother   . Atrial fibrillation Mother   . Hypertension Father   . Colon polyps Father   . Arthritis Maternal Grandmother   . Heart disease Maternal Grandmother   . Cancer Paternal Grandmother        breast  . Hypertension Paternal Grandmother   . Heart disease Paternal Grandfather   . Hypertension Paternal Grandfather   . Diabetes Paternal Grandfather   . Diabetes Mellitus II Sister   . Hypothyroidism Sister   . Colon cancer Neg Hx   . Esophageal cancer Neg Hx   . Rectal cancer Neg Hx   . Stomach cancer Neg Hx     SOCIAL HISTORY: Social History   Socioeconomic History  . Marital status: Single    Spouse name: Not on file  . Number of children: Not on file  . Years of education: Not on file  . Highest education level: Not on file  Occupational History  . Not on file  Social Needs  . Financial resource strain: Not on file  . Food insecurity    Worry: Not on file    Inability: Not on file  . Transportation needs    Medical: Not on file    Non-medical:  Not on file  Tobacco Use  . Smoking status: Former Smoker    Quit date: 11/09/2011    Years since quitting: 6.9  . Smokeless tobacco: Never Used  Substance and Sexual Activity  . Alcohol use: Yes    Alcohol/week: 0.0 standard drinks    Comment: occasionally   . Drug use: No  . Sexual activity: Not on file  Lifestyle  . Physical activity    Days per week: Not on file    Minutes per session: Not on file  . Stress: Not on file  Relationships  . Social Herbalist on phone: Not on file    Gets together: Not on file    Attends religious service: Not on file    Active member of club or organization: Not on file    Attends meetings of clubs or organizations: Not on file    Relationship status: Not on file  . Intimate partner violence    Fear of current or ex  partner: Not on file    Emotionally abused: Not on file    Physically abused: Not on file    Forced sexual activity: Not on file  Other Topics Concern  . Not on file  Social History Narrative  . Not on file    Allergies  Allergen Reactions  . Soy Allergy Anaphylaxis    Large amounts cause anaphylaxis Small amounts cause upset stomach    Current Outpatient Medications  Medication Sig Dispense Refill  . albuterol (PROVENTIL HFA;VENTOLIN HFA) 108 (90 Base) MCG/ACT inhaler Inhale 2 puffs into the lungs every 4 (four) hours as needed for wheezing or shortness of breath. 1 each 6  . cholecalciferol (VITAMIN D3) 25 MCG (1000 UT) tablet Take 1,000 Units by mouth daily.    . Cyanocobalamin (VITAMIN B 12 PO) Take by mouth.    . diazepam (VALIUM) 10 MG tablet Take 1 tablet (10 mg total) by mouth every 8 (eight) hours as needed for anxiety. 10 tablet 0  . dicyclomine (BENTYL) 20 MG tablet Take 20 mg by mouth every 6 (six) hours.    . ferrous sulfate 325 (65 FE) MG tablet Take 325 mg by mouth daily with breakfast.    . levothyroxine (SYNTHROID) 50 MCG tablet TAKE 1 TABLET (50 MCG TOTAL) BY MOUTH DAILY BEFORE BREAKFAST. MUST KEEP SCHEDULED APPT FOR AUGUST 6 FOR FUTURE REFILLS 90 tablet 3  . LORazepam (ATIVAN) 0.5 MG tablet Take 1 tablet (0.5 mg total) by mouth daily as needed for anxiety. 30 tablet 0   No current facility-administered medications for this visit.     REVIEW OF SYSTEMS:  [X]  denotes positive finding, [ ]  denotes negative finding Cardiac  Comments:  Chest pain or chest pressure:    Shortness of breath upon exertion:    Short of breath when lying flat:    Irregular heart rhythm:        Vascular    Pain in calf, thigh, or hip brought on by ambulation:    Pain in feet at night that wakes you up from your sleep:     Blood clot in your veins:    Leg swelling:         Pulmonary    Oxygen at home:    Productive cough:     Wheezing:         Neurologic    Sudden weakness in  arms or legs:     Sudden numbness in arms or legs:  Sudden onset of difficulty speaking or slurred speech:    Temporary loss of vision in one eye:     Problems with dizziness:         Gastrointestinal    Blood in stool:     Vomited blood:         Genitourinary    Burning when urinating:     Blood in urine:        Psychiatric    Major depression:         Hematologic    Bleeding problems:    Problems with blood clotting too easily:        Skin    Rashes or ulcers:        Constitutional    Fever or chills:     PHYSICAL EXAM:   Vitals:   10/24/18 1429  BP: 110/73  Pulse: 100  Resp: 14  Temp: 97.9 F (36.6 C)  TempSrc: Temporal  SpO2: 100%  Weight: 145 lb (65.8 kg)  Height: 5\' 2"  (1.575 m)    GENERAL: The patient is a well-nourished female, in no acute distress. The vital signs are documented above. CARDIAC: There is a regular rate and rhythm.  VASCULAR: I do not detect carotid bruits. She has palpable pedal pulses. She has some varicose veins along the medial aspect of her distal left thigh as documented below.  She has no significant leg swelling or hyperpigmentation.  PULMONARY: There is good air exchange bilaterally without wheezing or rales. ABDOMEN: Soft and non-tender with normal pitched bowel sounds.  MUSCULOSKELETAL: There are no major deformities or cyanosis. NEUROLOGIC: No focal weakness or paresthesias are detected. SKIN: There are no ulcers or rashes noted. PSYCHIATRIC: The patient has a normal affect.  DATA:    VENOUS DUPLEX: I have independently interpreted her venous duplex of the left lower extremity.  She has no evidence of DVT or superficial venous thrombosis.  There is no deep venous reflux.  There is superficial venous reflux involving the left great saphenous vein and left small saphenous vein.  However these veins are not especially dilated.

## 2018-10-25 ENCOUNTER — Encounter: Payer: Self-pay | Admitting: Vascular Surgery

## 2018-11-01 DIAGNOSIS — R110 Nausea: Secondary | ICD-10-CM | POA: Diagnosis not present

## 2018-11-01 DIAGNOSIS — Z20828 Contact with and (suspected) exposure to other viral communicable diseases: Secondary | ICD-10-CM | POA: Diagnosis not present

## 2018-11-03 DIAGNOSIS — Z20828 Contact with and (suspected) exposure to other viral communicable diseases: Secondary | ICD-10-CM | POA: Diagnosis not present

## 2018-11-28 DIAGNOSIS — N760 Acute vaginitis: Secondary | ICD-10-CM | POA: Diagnosis not present

## 2018-11-28 DIAGNOSIS — N898 Other specified noninflammatory disorders of vagina: Secondary | ICD-10-CM | POA: Diagnosis not present

## 2018-11-28 DIAGNOSIS — Z01419 Encounter for gynecological examination (general) (routine) without abnormal findings: Secondary | ICD-10-CM | POA: Diagnosis not present

## 2018-12-24 DIAGNOSIS — N200 Calculus of kidney: Secondary | ICD-10-CM | POA: Diagnosis not present

## 2019-01-13 DIAGNOSIS — F331 Major depressive disorder, recurrent, moderate: Secondary | ICD-10-CM | POA: Diagnosis not present

## 2019-01-13 DIAGNOSIS — F411 Generalized anxiety disorder: Secondary | ICD-10-CM | POA: Diagnosis not present

## 2019-01-13 DIAGNOSIS — F4312 Post-traumatic stress disorder, chronic: Secondary | ICD-10-CM | POA: Diagnosis not present

## 2019-01-14 ENCOUNTER — Other Ambulatory Visit: Payer: Self-pay

## 2019-01-14 ENCOUNTER — Emergency Department (HOSPITAL_COMMUNITY)
Admission: EM | Admit: 2019-01-14 | Discharge: 2019-01-14 | Disposition: A | Discharge status: Home / Self Care | Payer: 59 | Attending: Emergency Medicine | Admitting: Emergency Medicine

## 2019-01-14 ENCOUNTER — Encounter (HOSPITAL_COMMUNITY): Payer: Self-pay | Admitting: *Deleted

## 2019-01-14 DIAGNOSIS — R000 Tachycardia, unspecified: Secondary | ICD-10-CM | POA: Diagnosis not present

## 2019-01-14 DIAGNOSIS — Z5321 Procedure and treatment not carried out due to patient leaving prior to being seen by health care provider: Secondary | ICD-10-CM | POA: Insufficient documentation

## 2019-01-14 NOTE — ED Triage Notes (Signed)
Pt states after working out today she can not get her heart rate back down, rate on watch showing 138, in triage 96-105, central chest pain, has hyperthyroid.

## 2019-01-15 ENCOUNTER — Ambulatory Visit (INDEPENDENT_AMBULATORY_CARE_PROVIDER_SITE_OTHER): Payer: 59 | Admitting: Internal Medicine

## 2019-01-15 ENCOUNTER — Encounter: Payer: Self-pay | Admitting: Internal Medicine

## 2019-01-15 ENCOUNTER — Telehealth: Payer: 59 | Admitting: Family

## 2019-01-15 VITALS — BP 118/82 | HR 81 | Temp 98.3°F | Resp 16 | Ht 62.0 in | Wt 149.2 lb

## 2019-01-15 DIAGNOSIS — R000 Tachycardia, unspecified: Secondary | ICD-10-CM | POA: Diagnosis not present

## 2019-01-15 DIAGNOSIS — E876 Hypokalemia: Secondary | ICD-10-CM

## 2019-01-15 DIAGNOSIS — R079 Chest pain, unspecified: Secondary | ICD-10-CM

## 2019-01-15 DIAGNOSIS — R002 Palpitations: Secondary | ICD-10-CM | POA: Insufficient documentation

## 2019-01-15 LAB — CBC WITH DIFFERENTIAL/PLATELET
Basophils Absolute: 0 10*3/uL (ref 0.0–0.1)
Basophils Relative: 0.4 % (ref 0.0–3.0)
Eosinophils Absolute: 0.1 10*3/uL (ref 0.0–0.7)
Eosinophils Relative: 1.9 % (ref 0.0–5.0)
HCT: 39.5 % (ref 36.0–46.0)
Hemoglobin: 13.2 g/dL (ref 12.0–15.0)
Lymphocytes Relative: 30.8 % (ref 12.0–46.0)
Lymphs Abs: 1.2 10*3/uL (ref 0.7–4.0)
MCHC: 33.3 g/dL (ref 30.0–36.0)
MCV: 92.4 fl (ref 78.0–100.0)
Monocytes Absolute: 0.4 10*3/uL (ref 0.1–1.0)
Monocytes Relative: 10.6 % (ref 3.0–12.0)
Neutro Abs: 2.1 10*3/uL (ref 1.4–7.7)
Neutrophils Relative %: 56.3 % (ref 43.0–77.0)
Platelets: 199 10*3/uL (ref 150.0–400.0)
RBC: 4.28 Mil/uL (ref 3.87–5.11)
RDW: 12.4 % (ref 11.5–15.5)
WBC: 3.8 10*3/uL — ABNORMAL LOW (ref 4.0–10.5)

## 2019-01-15 LAB — BASIC METABOLIC PANEL
BUN: 10 mg/dL (ref 6–23)
CO2: 26 mEq/L (ref 19–32)
Calcium: 9.5 mg/dL (ref 8.4–10.5)
Chloride: 103 mEq/L (ref 96–112)
Creatinine, Ser: 0.62 mg/dL (ref 0.40–1.20)
GFR: 113.26 mL/min (ref >60.00)
Glucose, Bld: 93 mg/dL (ref 70–99)
Potassium: 3.8 mEq/L (ref 3.5–5.1)
Sodium: 140 mEq/L (ref 135–145)

## 2019-01-15 LAB — MAGNESIUM: Magnesium: 2.1 mg/dL (ref 1.5–2.5)

## 2019-01-15 LAB — TSH: TSH: 2.89 u[IU]/mL (ref 0.35–4.50)

## 2019-01-15 NOTE — Patient Instructions (Signed)
Sinus Tachycardia  Sinus tachycardia is a kind of fast heartbeat. In sinus tachycardia, the heart beats more than 100 times a minute. Sinus tachycardia starts in a part of the heart called the sinus node. Sinus tachycardia may be harmless, or it may be a sign of a serious condition. What are the causes? This condition may be caused by:  Exercise or exertion.  A fever.  Pain.  Loss of body fluids (dehydration).  Severe bleeding (hemorrhage).  Anxiety and stress.  Certain substances, including: ? Alcohol. ? Caffeine. ? Tobacco and nicotine products. ? Cold medicines. ? Illegal drugs.  Medical conditions including: ? Heart disease. ? An infection. ? An overactive thyroid (hyperthyroidism). ? A lack of red blood cells (anemia). What are the signs or symptoms? Symptoms of this condition include:  A feeling that the heart is beating quickly (palpitations).  Suddenly noticing your heartbeat (cardiac awareness).  Dizziness.  Tiredness (fatigue).  Shortness of breath.  Chest pain.  Nausea.  Fainting. How is this diagnosed? This condition is diagnosed with:  A physical exam.  Other tests, such as: ? Blood tests. ? An electrocardiogram (ECG). This test measures the electrical activity of the heart. ? Ambulatory cardiac monitor. This records your heartbeats for 24 hours or more. You may be referred to a heart specialist (cardiologist). How is this treated? Treatment for this condition depends on the cause or the underlying condition. Treatment may involve:  Treating the underlying condition.  Taking new medicines or changing your current medicines as told by your health care provider.  Making changes to your diet or lifestyle. Follow these instructions at home: Lifestyle   Do not use any products that contain nicotine or tobacco, such as cigarettes and e-cigarettes. If you need help quitting, ask your health care provider.  Do not use illegal drugs, such as  cocaine.  Learn relaxation methods to help you when you get stressed or anxious. These include deep breathing.  Avoid caffeine or other stimulants. Alcohol use   Do not drink alcohol if: ? Your health care provider tells you not to drink. ? You are pregnant, may be pregnant, or are planning to become pregnant.  If you drink alcohol, limit how much you have: ? 0-1 drink a day for women. ? 0-2 drinks a day for men.  Be aware of how much alcohol is in your drink. In the [place] one drink equals one typical bottle of beer (12 oz), one-half glass of wine (5 oz), or one shot of hard liquor (1 oz). General instructions  Drink enough fluids to keep your urine pale yellow.  Take over-the-counter and prescription medicines only as told by your health care provider.  Keep all follow-up visits as told by your health care provider. This is important. Contact a health care provider if you have:  A fever.  Vomiting or diarrhea that does not go away. Get help right away if you:  Have pain in your chest, upper arms, jaw, or neck.  Become weak or dizzy.  Feel faint.  Have palpitations that do not go away. Summary  In sinus tachycardia, the heart beats more than 100 times a minute.  Sinus tachycardia may be harmless, or it may be a sign of a serious condition.  Treatment for this condition depends on the cause or the underlying condition.  Get help right away if you have pain in your chest, upper arms, jaw, or neck. This information is not intended to replace advice given to you by   your health care provider. Make sure you discuss any questions you have with your health care provider. Document Released: 02/17/2004 Document Revised: 02/28/2017 Document Reviewed: 02/28/2017 Elsevier Patient Education  2020 [employer].

## 2019-01-15 NOTE — Progress Notes (Signed)
Subjective:  Patient ID: Michelle Waller, female    DOB: 1990/07/31  Age: 28 y.o. MRN: [id number]  CC: Tachycardia  This visit occurred during the SARS-CoV-2 public health emergency.  Safety protocols were in place, including screening questions prior to the visit, additional usage of staff PPE, and extensive cleaning of exam room while observing appropriate contact time as indicated for disinfecting solutions.   HPI Michelle Waller presents for concerns about her heart rate.  She tells me that she did a cardiovascular workout yesterday for 30 minutes and felt fine during the exercise but then afterwards she noticed her heart rate was up to 130.  She took an Ativan and says it did not help.  She felt some chest pressure and a strange sensation in her throat but she denies diaphoresis, dizziness, lightheadedness, or near syncope.  She tells me she was triaged in the ED and her EKG showed sinus tachycardia with a heart rate of 99.  She did not stay to be seen by the emergency department.  She has felt better since yesterday and has had no recurrences of the elevated heart rate.  She tells me she does not take decongestants, anti-inflammatories, or use illicit substances.  She takes an over-the-counter acid relief medicine and vitamin D.  Outpatient Medications Prior to Visit  Medication Sig Dispense Refill  . albuterol (PROVENTIL HFA;VENTOLIN HFA) 108 (90 Base) MCG/ACT inhaler Inhale 2 puffs into the lungs every 4 (four) hours as needed for wheezing or shortness of breath. 1 each 6  . cholecalciferol (VITAMIN D3) 25 MCG (1000 UT) tablet Take 1,000 Units by mouth daily.    . Cyanocobalamin (VITAMIN B 12 PO) Take by mouth.    . ferrous sulfate 325 (65 FE) MG tablet Take 325 mg by mouth daily with breakfast.    . levothyroxine (SYNTHROID) 50 MCG tablet TAKE 1 TABLET (50 MCG TOTAL) BY MOUTH DAILY BEFORE BREAKFAST. MUST KEEP SCHEDULED APPT FOR AUGUST 6 FOR FUTURE REFILLS 90 tablet 3  . LORazepam (ATIVAN) 0.5  MG tablet Take 1 tablet (0.5 mg total) by mouth daily as needed for anxiety. 30 tablet 0  . diazepam (VALIUM) 10 MG tablet Take 1 tablet (10 mg total) by mouth every 8 (eight) hours as needed for anxiety. (Patient not taking: Reported on 01/15/2019) 10 tablet 0  . dicyclomine (BENTYL) 20 MG tablet Take 20 mg by mouth every 6 (six) hours.     No facility-administered medications prior to visit.    ROS Review of Systems  Constitutional: Negative for diaphoresis and fatigue.  HENT: Negative.   Respiratory: Negative for cough, shortness of breath and wheezing.   Cardiovascular: Positive for palpitations. Negative for chest pain and leg swelling.  Gastrointestinal: Negative for abdominal pain, constipation, diarrhea, nausea and vomiting.  Endocrine: Negative.   Genitourinary: Negative.   Musculoskeletal: Negative.   Skin: Negative.   Neurological: Negative.  Negative for dizziness, syncope, weakness, light-headedness and headaches.  Hematological: Negative for adenopathy. Does not bruise/bleed easily.  Psychiatric/Behavioral: Negative for dysphoric mood and sleep disturbance. The patient is nervous/anxious.     Objective:  BP 118/82 (BP Location: Left Arm, Patient Position: Sitting, Cuff Size: Normal)   Pulse 81   Temp 98.3 F (36.8 C) (Oral)   Resp 16   Ht 5\' 2"  (1.575 m)   Wt 149 lb 3.2 oz (67.7 kg)   SpO2 99%   BMI 27.29 kg/m   BP Readings from Last 3 Encounters:  01/15/19 118/82  01/14/19 (!) 139/104  10/24/18 110/73    Wt Readings from Last 3 Encounters:  01/15/19 149 lb 3.2 oz (67.7 kg)  01/14/19 146 lb (66.2 kg)  10/24/18 145 lb (65.8 kg)    Physical Exam Vitals reviewed.  Constitutional:      Appearance: Normal appearance. She is not ill-appearing or diaphoretic.  HENT:     Nose: Nose normal.     Mouth/Throat:     Mouth: Mucous membranes are moist.  Eyes:     General: No scleral icterus.    Conjunctiva/sclera: Conjunctivae normal.  Cardiovascular:      Rate and Rhythm: Normal rate and regular rhythm.     Heart sounds: No murmur. No gallop.      Comments: EKG -   Sinus rhythm at 68.  Mild sinus arrhythmia.  PR interval of 110.  Otherwise normal EKG. Pulmonary:     Effort: Pulmonary effort is normal.     Breath sounds: No stridor. No wheezing, rhonchi or rales.  Abdominal:     General: Abdomen is flat. Bowel sounds are normal. There is no distension.     Palpations: Abdomen is soft. There is no hepatomegaly or splenomegaly.     Tenderness: There is no abdominal tenderness.  Musculoskeletal:        General: Normal range of motion.     Cervical back: Neck supple.     Right lower leg: No edema.     Left lower leg: No edema.  Lymphadenopathy:     Cervical: No cervical adenopathy.  Skin:    General: Skin is warm and dry.  Neurological:     General: No focal deficit present.     Mental Status: She is alert.  Psychiatric:        Attention and Perception: Attention normal.        Mood and Affect: Mood is anxious.        Speech: Speech normal.        Behavior: Behavior normal.        Thought Content: Thought content normal. Thought content does not include homicidal or suicidal ideation.        Cognition and Memory: Cognition normal.     Lab Results  Component Value Date   WBC 3.8 (L) 01/15/2019   HGB 13.2 01/15/2019   HCT 39.5 01/15/2019   PLT 199.0 01/15/2019   GLUCOSE 93 01/15/2019   CHOL 180 08/29/2018   TRIG 144.0 08/29/2018   HDL 63.70 08/29/2018   LDLCALC 88 08/29/2018   ALT 18 09/24/2018   AST 24 09/24/2018   NA 140 01/15/2019   K 3.8 01/15/2019   CL 103 01/15/2019   CREATININE 0.62 01/15/2019   BUN 10 01/15/2019   CO2 26 01/15/2019   TSH 2.89 01/15/2019   HGBA1C 5.3 08/09/2016    No results found.  Assessment & Plan:   Michelle Waller was seen today for tachycardia.  Diagnoses and all orders for this visit:  Tachycardia- Her heart rate is normal today.  Labs are negative for secondary causes.  She will let me  know if the elevated heart rate recurs and if it does we will consider adding something like a beta-blocker or calcium channel blocker to treat likely causes including sinus tachycardia, SVT, or A. fib/flutter.  I have asked her to undergo a 48-hour Holter monitor to see if we can capture the rhythm when her heart rate is elevated. -     CBC with Differential -     TSH -  D-dimer, quantitative -     Holter monitor - 48 hour; Future  Hypokalemia- Her potassium level is normal now. -     Magnesium -     Basic metabolic panel   I am having Michelle Waller maintain her LORazepam, albuterol, diazepam, Cyanocobalamin (VITAMIN B 12 PO), cholecalciferol, ferrous sulfate, dicyclomine, and levothyroxine.  No orders of the defined types were placed in this encounter.    Follow-up: Return in about 2 weeks (around 01/29/2019).  Michelle Calico, MD

## 2019-01-15 NOTE — Progress Notes (Signed)
Based on what you shared with me, I feel your condition warrants further evaluation and I recommend that you be seen for a face to face office visit.   Given your current symptoms, you need to be seen face to face to rule out more serious conditions.    NOTE: If you entered your credit card information for this eVisit, you will not be charged. You may see a "hold" on your card for the $35 but that hold will drop off and you will not have a charge processed.   If you are having a true medical emergency please call 911.      For an urgent face to face visit, [facility] has five urgent care centers for your convenience:      NEW:  [facility] [place] [facility] [place] Get Driving Directions [id number] [address] [place], [place] [address] . 10 am - 6pm Monday - Friday    [facility]) Get Driving Directions [id number] [address] [place], [place] [address] . 10 am to 8 pm Monday-Friday . 12 pm to 8 pm [facility] at [facility] Get Driving Directions [id number] [place], [place], [place] [address] . 8 am to 8 pm Monday-Friday . 9 am to 6 pm Saturday . 11 am to 6 pm Sunday     [facility] at [facility] Get Driving Directions  [id number] [address] [place], [place] [address] . 8 am to 8 pm Monday-Friday . 8 am to 4 pm [facility] at [place] Get Driving Directions [id number] [place], [place] [address] . 12 pm to 6 pm Monday-Friday      Your e-visit answers were reviewed by a board certified advanced clinical practitioner to complete your personal care plan.  Thank you for using e-Visits.

## 2019-01-16 ENCOUNTER — Encounter: Payer: Self-pay | Admitting: Internal Medicine

## 2019-01-16 LAB — D-DIMER, QUANTITATIVE: D-Dimer, Quant: <0.19 mcg/mL FEU (ref <0.50)

## 2019-01-22 ENCOUNTER — Telehealth: Payer: Self-pay | Admitting: Radiology

## 2019-01-22 NOTE — Telephone Encounter (Signed)
Enrolled patient for a 3 day Zio monitor to be mailed to patients home. Brief instructions were gone over with the patient and she knows to expect the monitor to arrive in 5-7 days. Patient was instructed per ordering MD she only needs to wear for 48hrs.  48hr Holter was switch to a 3 day Zio for mailing purposes during covid

## 2019-01-27 ENCOUNTER — Encounter (HOSPITAL_COMMUNITY): Payer: Self-pay

## 2019-01-27 ENCOUNTER — Emergency Department (HOSPITAL_COMMUNITY): Payer: No Typology Code available for payment source

## 2019-01-27 ENCOUNTER — Other Ambulatory Visit: Payer: Self-pay

## 2019-01-27 DIAGNOSIS — Z79899 Other long term (current) drug therapy: Secondary | ICD-10-CM | POA: Insufficient documentation

## 2019-01-27 DIAGNOSIS — M79602 Pain in left arm: Secondary | ICD-10-CM | POA: Insufficient documentation

## 2019-01-27 DIAGNOSIS — J45909 Unspecified asthma, uncomplicated: Secondary | ICD-10-CM | POA: Diagnosis not present

## 2019-01-27 DIAGNOSIS — R0789 Other chest pain: Secondary | ICD-10-CM | POA: Diagnosis present

## 2019-01-27 DIAGNOSIS — Z87891 Personal history of nicotine dependence: Secondary | ICD-10-CM | POA: Insufficient documentation

## 2019-01-27 DIAGNOSIS — E039 Hypothyroidism, unspecified: Secondary | ICD-10-CM | POA: Insufficient documentation

## 2019-01-27 LAB — TROPONIN I (HIGH SENSITIVITY): Troponin I (High Sensitivity): <2 ng/L (ref <18)

## 2019-01-27 LAB — CBC
HCT: 40.1 % (ref 36.0–46.0)
Hemoglobin: 13.4 g/dL (ref 12.0–15.0)
MCH: 30.7 pg (ref 26.0–34.0)
MCHC: 33.4 g/dL (ref 30.0–36.0)
MCV: 91.8 fL (ref 80.0–100.0)
Platelets: 199 10*3/uL (ref 150–400)
RBC: 4.37 MIL/uL (ref 3.87–5.11)
RDW: 11.3 % — ABNORMAL LOW (ref 11.5–15.5)
WBC: 6.2 10*3/uL (ref 4.0–10.5)
nRBC: 0 % (ref 0.0–0.2)

## 2019-01-27 LAB — I-STAT BETA HCG BLOOD, ED (MC, WL, AP ONLY): I-stat hCG, quantitative: <5 m[IU]/mL (ref <5)

## 2019-01-27 LAB — BASIC METABOLIC PANEL
Anion gap: 10 (ref 5–15)
BUN: 11 mg/dL (ref 6–20)
CO2: 27 mmol/L (ref 22–32)
Calcium: 9.2 mg/dL (ref 8.9–10.3)
Chloride: 103 mmol/L (ref 98–111)
Creatinine, Ser: 0.96 mg/dL (ref 0.44–1.00)
GFR calc Af Amer: >60 mL/min (ref >60)
GFR calc non Af Amer: >60 mL/min (ref >60)
Glucose, Bld: 110 mg/dL — ABNORMAL HIGH (ref 70–99)
Potassium: 3.4 mmol/L — ABNORMAL LOW (ref 3.5–5.1)
Sodium: 140 mmol/L (ref 135–145)

## 2019-01-27 MED ORDER — SODIUM CHLORIDE 0.9% FLUSH: 3.0000 mL | Freq: Once | INTRAVENOUS | Status: DC

## 2019-01-27 NOTE — ED Triage Notes (Signed)
Pt presents with c/o left arm pain and pain/fluttering feeling in her chest. Pt reports the fluttering in her chest started on 12/22 and the chest pain started on Saturday night. Pt is in NAD.

## 2019-01-28 ENCOUNTER — Other Ambulatory Visit (INDEPENDENT_AMBULATORY_CARE_PROVIDER_SITE_OTHER): Payer: No Typology Code available for payment source

## 2019-01-28 ENCOUNTER — Emergency Department (HOSPITAL_COMMUNITY)
Admission: EM | Admit: 2019-01-28 | Discharge: 2019-01-28 | Disposition: A | Discharge status: Home / Self Care | Payer: No Typology Code available for payment source | Attending: Emergency Medicine | Admitting: Emergency Medicine

## 2019-01-28 DIAGNOSIS — R000 Tachycardia, unspecified: Secondary | ICD-10-CM

## 2019-01-28 DIAGNOSIS — R0789 Other chest pain: Secondary | ICD-10-CM

## 2019-01-28 LAB — TROPONIN I (HIGH SENSITIVITY): Troponin I (High Sensitivity): <2 ng/L (ref <18)

## 2019-01-28 LAB — MAGNESIUM: Magnesium: 2.2 mg/dL (ref 1.7–2.4)

## 2019-01-28 NOTE — ED Notes (Signed)
Pt ambulated to restroom with no assistance. ?

## 2019-01-28 NOTE — Discharge Instructions (Signed)
Read instructions below for reasons to return to the Emergency Department. It is recommended that your follow up with your Primary Care Doctor in regards to today's visit.  -Also recommend you follow-up with cardiology.  I have included permission for [facility].  You can call the office to schedule a follow-up appointment.  Tests performed today include: An EKG of your heart A chest x-ray Cardiac enzymes - a blood test for heart muscle damage Blood counts and electrolytes  Chest Pain (Nonspecific)  HOME CARE INSTRUCTIONS  -For the next few days, avoid physical activities that bring on chest pain. Continue physical activities as directed.  -Follow your caregiver's suggestions for further testing if your chest pain does not go away.  -Keep any follow-up appointments you made. If you do not go to an appointment, you could develop lasting (chronic) problems with pain. If there is any problem keeping an appointment, you must call to reschedule.   SEEK MEDICAL CARE IF:  You think you are having problems from the medicine you are taking. Read your medicine instructions carefully.  Your chest pain does not go away, even after treatment.  You develop a rash with blisters on your chest.   SEEK IMMEDIATE MEDICAL CARE IF:  You have increased chest pain or pain that spreads to your arm, neck, jaw, back, or belly (abdomen).  You develop shortness of breath, an increasing cough, or you are coughing up blood.  You have severe back or abdominal pain, feel sick to your stomach (nauseous) or throw up (vomit).  You develop severe weakness, fainting, or chills.  You have an oral temperature above 102 F (38.9 C), not controlled by medicine.   THIS IS AN EMERGENCY. Do not wait to see if the pain will go away. Get medical help at once. Call 911. Do not drive yourself to the hospital.

## 2019-01-28 NOTE — ED Provider Notes (Signed)
[facility] DEPT Provider Note   CSN: [id number] Arrival date & time: 01/27/19  1304     History Chief Complaint  Patient presents with  . Chest Pain  . Arm Pain    Michelle Waller is a 29 y.o. female with past medical history significant for hypothyroidism, anxiety, IBS, hiatal hernia presents to emergency department today with chief complaint of chest pain x 3 days.  She states the pain is located in the left side of her chest and radiates to her left arm.  She describes the pain as an aching sensation.  She states the pain has been intermittent.  Today she was walking at work when the chest pain returned. She is also reporting palpitations x2 weeks.  She states she has intermittent fluttering sensation in her chest.  She states her Apple Watch ranges from 80-130. She worked out today and her heart rate was normal during exercise, but then was elevated and she was unable to get it back to normal despite sitting and resting.  Denies dyspnea on exertion, SOB, chest tightness or pressure, radiation to left/right arm, jaw or back, nausea, or diaphoresis.  Patient states her mom has A. fib otherwise no family cardiac history.  Patient does not smoke, is not on birth control, denies any recent periods of immobilization or long travel.  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  It was decided she would wear a Holter monitor for 48 hours.  No beta-blockers or calcium channel blockers were started at that visit.  TSH was checked and was within normal range normal. Past Medical History:  Diagnosis Date  . Allergy   . Anxiety   . Asthma    physical induced asthma   . Eating disorder   . Frequent headaches   . Gastritis   . GERD (gastroesophageal reflux disease)   . Hiatal hernia   . IBS (irritable bowel syndrome)   . Kidney stones   . Ovarian cyst   . Thyroid disease   . UTI (urinary tract  infection)   . Vision abnormalities     Patient Active Problem List   Diagnosis Date Noted  . Tachycardia 01/15/2019  . Hypokalemia 01/15/2019  . Nonallopathic lesion of lumbosacral region 10/02/2017  . Nonallopathic lesion of sacral region 10/02/2017  . Irritable bowel syndrome 06/21/2017  . Routine general medical examination at a health care facility 06/14/2017  . 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 Nonallopathic lesion of thoracic region 04/30/2017  . Nonallopathic lesion of rib cage 04/30/2017  . Nonallopathic lesion of cervical region 04/30/2017  . ANA positive 10/31/2016  . Anxiety 08/15/2016  . Hypothyroidism 02/03/2016  . GERD (gastroesophageal reflux disease) 02/11/2015  . Kidney stones 02/11/2015    Past Surgical History:  Procedure Laterality Date  . ADENOIDECTOMY    . COLONOSCOPY    . CYSTOSCOPY    . KNEE SURGERY       OB History    Gravida  0   Para  0   Term  0   Preterm  0   AB  0   Living  0     SAB  0   TAB  0   Ectopic  0   Multiple  0   Live Births              Family History  Problem Relation Age of Onset  . Depression Mother   .  Atrial fibrillation Mother   . Hypertension Father   . Colon polyps Father   . Arthritis Maternal Grandmother   . Heart disease Maternal Grandmother   . Cancer Paternal Grandmother        breast  . Hypertension Paternal Grandmother   . Heart disease Paternal Grandfather   . Hypertension Paternal Grandfather   . Diabetes Paternal Grandfather   . Diabetes Mellitus II Sister   . Hypothyroidism Sister   . Colon cancer Neg Hx   . Esophageal cancer Neg Hx   . Rectal cancer Neg Hx   . Stomach cancer Neg Hx     Social History   Tobacco Use  . Smoking status: Former Smoker    Quit date: 11/09/2011    Years since quitting: 7.2  . Smokeless tobacco: Never Used  Substance Use Topics  . Alcohol use: Yes    Alcohol/week: 0.0 standard drinks    Comment: occasionally   . Drug use: No    Home  Medications Prior to Admission medications   Medication Sig Start Date End Date Taking? Authorizing Provider  albuterol (PROVENTIL HFA;VENTOLIN HFA) 108 (90 Base) MCG/ACT inhaler Inhale 2 puffs into the lungs every 4 (four) hours as needed for wheezing or shortness of breath. 11/19/17  Yes Lyndal Pulley, DO  bifidobacterium infantis (ALIGN) capsule Take 1 capsule by mouth daily.   Yes [provider]  cholecalciferol (VITAMIN D3) 25 MCG (1000 UT) tablet Take 1,000 Units by mouth daily.   Yes [provider]  Cyanocobalamin (VITAMIN B 12 PO) Place 1 tablet under the tongue daily.    Yes [provider]  levothyroxine (SYNTHROID) 50 MCG tablet TAKE 1 TABLET (50 MCG TOTAL) BY MOUTH DAILY BEFORE BREAKFAST. MUST KEEP SCHEDULED APPT FOR AUGUST 6 FOR FUTURE REFILLS Patient taking differently: Take 50 mcg by mouth daily before breakfast.  09/16/18  Yes Hoyt Koch, MD  Prenatal Vit-Fe Fumarate-FA (PRENATAL MULTIVITAMIN) TABS tablet Take 1 tablet by mouth daily at 12 noon.   Yes [provider]  sertraline (ZOLOFT) 50 MG tablet Take 50 mg by mouth daily.   Yes [provider]  diazepam (VALIUM) 10 MG tablet Take 1 tablet (10 mg total) by mouth every 8 (eight) hours as needed for anxiety. Patient not taking: Reported on 01/15/2019 02/14/18   Darlin Priestly, PA-C  LORazepam (ATIVAN) 0.5 MG tablet Take 1 tablet (0.5 mg total) by mouth daily as needed for anxiety. Patient not taking: Reported on 01/28/2019 11/06/17   Hoyt Koch, MD    Allergies    Soy allergy  Review of Systems   Review of Systems All other systems are reviewed and are negative for acute change except as noted in the HPI.  Physical Exam Updated Vital Signs BP (!) 142/94   Pulse 99   Temp 98.8 F (37.1 C) (Oral)   Resp 18   Ht 5\' 2"  (1.575 m)   Wt 65.8 kg   LMP 01/27/2019 (Approximate)   SpO2 99%   BMI 26.52 kg/m   Physical Exam Vitals and nursing note  reviewed.  Constitutional:      General: She is not in acute distress.    Appearance: She is not ill-appearing.  HENT:     Head: Normocephalic and atraumatic.     Right Ear: Tympanic membrane and external ear normal.     Left Ear: Tympanic membrane and external ear normal.     Nose: Nose normal.     Mouth/Throat:  Mouth: Mucous membranes are moist.     Pharynx: Oropharynx is clear.  Eyes:     General: No scleral icterus.       Right eye: No discharge.        Left eye: No discharge.     Extraocular Movements: Extraocular movements intact.     Conjunctiva/sclera: Conjunctivae normal.     Pupils: Pupils are equal, round, and reactive to light.  Neck:     Vascular: No JVD.  Cardiovascular:     Rate and Rhythm: Normal rate and regular rhythm.     Pulses: Normal pulses.          Radial pulses are 2+ on the right side and 2+ on the left side.     Heart sounds: Normal heart sounds. No murmur. No gallop.   Pulmonary:     Comments: Lungs clear to auscultation in all fields. Symmetric chest rise. No wheezing, rales, or rhonchi. Chest:     Chest wall: No tenderness.  Abdominal:     Comments: Abdomen is soft, non-distended, and non-tender in all quadrants. No rigidity, no guarding. No peritoneal signs.  Musculoskeletal:        General: Normal range of motion.     Cervical back: Normal range of motion.     Right lower leg: No edema.     Left lower leg: No edema.  Skin:    General: Skin is warm and dry.     Capillary Refill: Capillary refill takes less than 2 seconds.  Neurological:     Mental Status: She is oriented to person, place, and time.     GCS: GCS eye subscore is 4. GCS verbal subscore is 5. GCS motor subscore is 6.     Comments: Fluent speech, no facial droop.  Psychiatric:        Behavior: Behavior normal.       ED Results / Procedures / Treatments   Labs (all labs ordered are listed, but only abnormal results are displayed) Labs Reviewed  BASIC METABOLIC PANEL -  Abnormal; Notable for the following components:      Result Value   Potassium 3.4 (*)    Glucose, Bld 110 (*)    All other components within normal limits  CBC - Abnormal; Notable for the following components:   RDW 11.3 (*)    All other components within normal limits  MAGNESIUM  I-STAT BETA HCG BLOOD, ED ([facility], [facility], [facility] ONLY)  TROPONIN I (HIGH SENSITIVITY)  TROPONIN I (HIGH SENSITIVITY)    EKG EKG Interpretation  Date/Time:  Monday January 27 2019 13:18:13 EST Ventricular Rate:  84 PR Interval:    QRS Duration: 91 QT Interval:  351 QTC Calculation: 415 R Axis:   72 Text Interpretation: Sinus rhythm Atrial premature complex Short PR interval Borderline T abnormalities, inferior leads Since last tracing rate slower 14 Jan 2019 Confirmed by Rolland Porter [phone]) on 01/27/2019 11:31:55 PM   Radiology DG Chest 2 View  Result Date: 01/27/2019 CLINICAL DATA:  Left arm pain.  Chest pain. EXAM: CHEST - 2 VIEW COMPARISON:  Two-view chest x-ray 06/17/2016. FINDINGS: The heart size and mediastinal contours are within normal limits. Both lungs are clear. The visualized skeletal structures are unremarkable. IMPRESSION: Negative two view chest x-ray Electronically Signed   By: San Morelle M.D.   On: 01/27/2019 13:48    Procedures Procedures (including critical care time)  Medications Ordered in ED Medications  sodium chloride flush (NS) 0.9 % injection 3 mL (has  no administration in time range)    ED Course  I have reviewed the triage vital signs and the nursing notes.  Pertinent labs & imaging results that were available during my care of the patient were reviewed by me and considered in my medical decision making (see chart for details).  Patient was noted to have 3 vital signs recorded with tachycardia and hypoxia.  These were in error as a faulty equipment.  I checked patient multiple times as well as did nursing staff and patient had stable vitals at all times.  She was not  tachycardic or hypoxic during ED stay.   MDM Rules/Calculators/A&P                      Patient presents to the emergency department with chest pain. Patient nontoxic appearing, in no apparent distress, vitals without significant abnormality. Fairly benign physical exam. DDX: ACS, pulmonary embolism, dissection, pneumothorax, effusion, infiltrate, arrhythmia, anemia, electrolyte derangement, MSK. Evaluation initiated with labs, EKG, and CXR. Patient on cardiac monitor.  Work-up in the ER unremarkable. Labs reviewed, no leukocytosis, anemia, or significant electrolyte abnormality. CXR without infiltrate, effusion, pneumothorax, or fracture/dislocation.  Chart review shows patient had recent TSH testing on 01/15/2019 with result 2.89, within normal range.  Beta hCG is negative. Low risk heart score of 1, EKG without obvious ischemia, delta troponin negative, doubt ACS. Patient is low risk wells, PERC negative, doubt pulmonary embolism. Pain is not a tearing sensation, symmetric pulses, no widening of mediastinum on CXR, doubt dissection. Cardiac monitor reviewed, no notable arrhythmias or tachycardia. Patient has appeared hemodynamically stable throughout ER visit and appears safe for discharge with close PCP/cardiology follow up.  She has a Holter monitor at home that she is supposed to wear for 2 days, and just arrived today so she has not yet worn it.    I discussed results, treatment plan, need for PCP follow-up, and return precautions with the patient. Provided opportunity for questions, patient confirmed understanding and is in agreement with plan. Findings and plan of care discussed with supervising physician Dr. Tomi Bamberger.    Portions of this note were generated with [employer]. Dictation errors may occur despite best attempts at proofreading.   Final Clinical Impression(s) / ED Diagnoses Final diagnoses:  Atypical chest pain    Rx / DC Orders ED Discharge Orders    None        Cherre Robins, PA-C 01/28/19 [id number]    Rolland Porter, MD 01/28/19 [phone]

## 2019-01-30 ENCOUNTER — Encounter: Payer: Self-pay | Admitting: Internal Medicine

## 2019-01-30 DIAGNOSIS — L989 Disorder of the skin and subcutaneous tissue, unspecified: Secondary | ICD-10-CM

## 2019-01-31 ENCOUNTER — Encounter: Payer: Self-pay | Admitting: Internal Medicine

## 2019-02-03 ENCOUNTER — Emergency Department
Admission: EM | Admit: 2019-02-03 | Discharge: 2019-02-04 | Disposition: A | Discharge status: Home / Self Care | Payer: No Typology Code available for payment source | Attending: Emergency Medicine | Admitting: Emergency Medicine

## 2019-02-03 ENCOUNTER — Other Ambulatory Visit: Payer: Self-pay

## 2019-02-03 ENCOUNTER — Encounter: Payer: Self-pay | Admitting: Internal Medicine

## 2019-02-03 ENCOUNTER — Emergency Department: Payer: No Typology Code available for payment source

## 2019-02-03 DIAGNOSIS — R002 Palpitations: Secondary | ICD-10-CM | POA: Diagnosis not present

## 2019-02-03 DIAGNOSIS — J45909 Unspecified asthma, uncomplicated: Secondary | ICD-10-CM | POA: Diagnosis not present

## 2019-02-03 DIAGNOSIS — Z87891 Personal history of nicotine dependence: Secondary | ICD-10-CM | POA: Insufficient documentation

## 2019-02-03 DIAGNOSIS — Z79899 Other long term (current) drug therapy: Secondary | ICD-10-CM | POA: Insufficient documentation

## 2019-02-03 DIAGNOSIS — E039 Hypothyroidism, unspecified: Secondary | ICD-10-CM | POA: Diagnosis not present

## 2019-02-03 LAB — CBC
HCT: 38.5 % (ref 36.0–46.0)
Hemoglobin: 13.1 g/dL (ref 12.0–15.0)
MCH: 30 pg (ref 26.0–34.0)
MCHC: 34 g/dL (ref 30.0–36.0)
MCV: 88.3 fL (ref 80.0–100.0)
Platelets: 213 10*3/uL (ref 150–400)
RBC: 4.36 MIL/uL (ref 3.87–5.11)
RDW: 11.2 % — ABNORMAL LOW (ref 11.5–15.5)
WBC: 7.1 10*3/uL (ref 4.0–10.5)
nRBC: 0 % (ref 0.0–0.2)

## 2019-02-03 LAB — BASIC METABOLIC PANEL
Anion gap: 9 (ref 5–15)
BUN: 14 mg/dL (ref 6–20)
CO2: 27 mmol/L (ref 22–32)
Calcium: 9.2 mg/dL (ref 8.9–10.3)
Chloride: 104 mmol/L (ref 98–111)
Creatinine, Ser: 0.71 mg/dL (ref 0.44–1.00)
GFR calc Af Amer: >60 mL/min (ref >60)
GFR calc non Af Amer: >60 mL/min (ref >60)
Glucose, Bld: 112 mg/dL — ABNORMAL HIGH (ref 70–99)
Potassium: 3.4 mmol/L — ABNORMAL LOW (ref 3.5–5.1)
Sodium: 140 mmol/L (ref 135–145)

## 2019-02-03 LAB — T4, FREE: Free T4: 0.74 ng/dL (ref 0.61–1.12)

## 2019-02-03 LAB — TROPONIN I (HIGH SENSITIVITY)
Troponin I (High Sensitivity): <2 ng/L (ref <18)
Troponin I (High Sensitivity): <2 ng/L (ref <18)

## 2019-02-03 LAB — POCT PREGNANCY, URINE: Preg Test, Ur: NEGATIVE

## 2019-02-03 LAB — TSH: TSH: 3.693 u[IU]/mL (ref 0.350–4.500)

## 2019-02-03 NOTE — ED Notes (Signed)
ED Provider at bedside. 

## 2019-02-03 NOTE — ED Triage Notes (Signed)
Pt to ED from home c/o left chest palpitations since December.  States was placed on a halter monitor by PCP but no results from it.  States radiating palpitaitons to throat, bilateral arms and neck spasms intermittently.  Denies pain or SOB, denies drug alcohol or tobacco use.  Hx of GERD, denies contraceptive use.  Takes levothyroxine and zoloft.  Pt ambulatory with steady gait, chest rise even and unlabored, skin WNL, in NAD at this time.

## 2019-02-03 NOTE — ED Notes (Signed)
Pt ambulatory to treatment room.  Pt reports she is having palpitations.  Sx for 2-3 weeks.  Hx gerd.  Pt states she feels pressure in her neck.  No chest pain or sob.   Nonsmoker.  No cough.  No fever .  Pt alert  Speech clear.

## 2019-02-04 NOTE — ED Provider Notes (Signed)
[facility] Emergency Department Provider Note  ____________________________________________  Time seen: Approximately 12:01 AM  I have reviewed the triage vital signs and the nursing notes.   HISTORY  Chief Complaint Palpitations   HPI Michelle Waller is a 29 y.o. female with a history of allergy, anxiety, asthma, GERD, hiatal hernia, IBS, hypothyroidism who presents for evaluation of palpitations.  Patient reports that she has been having several daily episodes of palpitations for the last 2 weeks.  It started while she was doing some aerobic exercise at the gym.  She is used to working out on a daily basis.  She has been seen in the ER and by her primary care doctor for these.  She has worn a Holter monitor which was submitted for analysis 2 days ago.  The results were not back yet.  She does endorse having several episodes while on the monitor.  She denies any associated symptoms of chest pain, shortness of breath, dizziness, syncope.  She denies any personal history of dysrhythmias.  She denies any personal or family history of blood clots, recent travel immobilization, leg pain or swelling, hemoptysis, or exogenous hormones.  She describes these episodes as feeling like her heart skipping a beat.  She denies any drug use, alcohol use, or smoking.  She denies caffeine.  She does report history of anxiety and stress.   No family history of sudden death.  Past Medical History:  Diagnosis Date  . Allergy   . Anxiety   . Asthma    physical induced asthma   . Eating disorder   . Frequent headaches   . Gastritis   . GERD (gastroesophageal reflux disease)   . Hiatal hernia   . IBS (irritable bowel syndrome)   . Kidney stones   . Ovarian cyst   . Thyroid disease   . UTI (urinary tract infection)   . Vision abnormalities     Patient Active Problem List   Diagnosis Date Noted  . Tachycardia 01/15/2019  . Hypokalemia 01/15/2019  . Nonallopathic lesion of  lumbosacral region 10/02/2017  . Nonallopathic lesion of sacral region 10/02/2017  . Irritable bowel syndrome 06/21/2017  . Routine general medical examination at a health care facility 06/14/2017  . 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 Nonallopathic lesion of thoracic region 04/30/2017  . Nonallopathic lesion of rib cage 04/30/2017  . Nonallopathic lesion of cervical region 04/30/2017  . ANA positive 10/31/2016  . Anxiety 08/15/2016  . Hypothyroidism 02/03/2016  . GERD (gastroesophageal reflux disease) 02/11/2015  . Kidney stones 02/11/2015    Past Surgical History:  Procedure Laterality Date  . ADENOIDECTOMY    . COLONOSCOPY    . CYSTOSCOPY    . KNEE SURGERY      Prior to Admission medications   Medication Sig Start Date End Date Taking? Authorizing Provider  albuterol (PROVENTIL HFA;VENTOLIN HFA) 108 (90 Base) MCG/ACT inhaler Inhale 2 puffs into the lungs every 4 (four) hours as needed for wheezing or shortness of breath. 11/19/17   Lyndal Pulley, DO  bifidobacterium infantis (ALIGN) capsule Take 1 capsule by mouth daily.    [provider]  cholecalciferol (VITAMIN D3) 25 MCG (1000 UT) tablet Take 1,000 Units by mouth daily.    [provider]  Cyanocobalamin (VITAMIN B 12 PO) Place 1 tablet under the tongue daily.     [provider]  diazepam (VALIUM) 10 MG tablet Take 1 tablet (10 mg total) by mouth every 8 (eight) hours as needed  for anxiety. Patient not taking: Reported on 01/15/2019 02/14/18   Darlin Priestly, PA-C  levothyroxine (SYNTHROID) 50 MCG tablet TAKE 1 TABLET (50 MCG TOTAL) BY MOUTH DAILY BEFORE BREAKFAST. MUST KEEP SCHEDULED APPT FOR AUGUST 6 FOR FUTURE REFILLS Patient taking differently: Take 50 mcg by mouth daily before breakfast.  09/16/18   Hoyt Koch, MD  LORazepam (ATIVAN) 0.5 MG tablet Take 1 tablet (0.5 mg total) by mouth daily as needed for anxiety. Patient not taking: Reported on 01/28/2019 11/06/17   Hoyt Koch, MD  Prenatal Vit-Fe Fumarate-FA (PRENATAL MULTIVITAMIN) TABS tablet Take 1 tablet by mouth daily at 12 noon.    [provider]  sertraline (ZOLOFT) 50 MG tablet Take 50 mg by mouth daily.    [provider]    Allergies Soy allergy  Family History  Problem Relation Age of Onset  . Depression Mother   . Atrial fibrillation Mother   . Hypertension Father   . Colon polyps Father   . Arthritis Maternal Grandmother   . Heart disease Maternal Grandmother   . Cancer Paternal Grandmother        breast  . Hypertension Paternal Grandmother   . Heart disease Paternal Grandfather   . Hypertension Paternal Grandfather   . Diabetes Paternal Grandfather   . Diabetes Mellitus II Sister   . Hypothyroidism Sister   . Colon cancer Neg Hx   . Esophageal cancer Neg Hx   . Rectal cancer Neg Hx   . Stomach cancer Neg Hx     Social History Social History   Tobacco Use  . Smoking status: Former Smoker    Quit date: 11/09/2011    Years since quitting: 7.2  . Smokeless tobacco: Never Used  Substance Use Topics  . Alcohol use: Yes    Alcohol/week: 0.0 standard drinks    Comment: occasionally   . Drug use: No    Review of Systems  Constitutional: Negative for fever. Eyes: Negative for visual changes. ENT: Negative for sore throat. Neck: No neck pain  Cardiovascular: Negative for chest pain. + palpitations Respiratory: Negative for shortness of breath. Gastrointestinal: Negative for abdominal pain, vomiting or diarrhea. Genitourinary: Negative for dysuria. Musculoskeletal: Negative for back pain. Skin: Negative for rash. Neurological: Negative for headaches, weakness or numbness. Psych: No SI or HI  ____________________________________________   PHYSICAL EXAM:  VITAL SIGNS: ED Triage Vitals  Enc Vitals Group     BP 02/03/19 2034 137/89     Pulse Rate 02/03/19 2034 93     Resp 02/03/19 2034 16     Temp 02/03/19 2034 99.8 F (37.7 C)     Temp  Source 02/03/19 2034 Oral     SpO2 02/03/19 2034 100 %     Weight 02/03/19 2029 144 lb (65.3 kg)     Height 02/03/19 2029 5\' 2"  (1.575 m)     Head Circumference --      Peak Flow --      Pain Score 02/03/19 2029 0     Pain Loc --      Pain Edu? --      Excl. in [place]? --     Constitutional: Alert and oriented. Well appearing and in no apparent distress. HEENT:      Head: Normocephalic and atraumatic.         Eyes: Conjunctivae are normal. Sclera is non-icteric.       Mouth/Throat: Mucous membranes are moist.       Neck: Supple with no  signs of meningismus. Cardiovascular: Regular rate and rhythm. No murmurs, gallops, or rubs. 2+ symmetrical distal pulses are present in all extremities. No JVD. Respiratory: Normal respiratory effort. Lungs are clear to auscultation bilaterally. No wheezes, crackles, or rhonchi.  Gastrointestinal: Soft, non tender, and non distended with positive bowel sounds. No rebound or guarding. Musculoskeletal: Nontender with normal range of motion in all extremities. No edema, cyanosis, or erythema of extremities. Neurologic: Normal speech and language. Face is symmetric. Moving all extremities. No gross focal neurologic deficits are appreciated. Skin: Skin is warm, dry and intact. No rash noted. Psychiatric: Mood and affect are normal. Speech and behavior are normal.  ____________________________________________   LABS (all labs ordered are listed, but only abnormal results are displayed)  Labs Reviewed  BASIC METABOLIC PANEL - Abnormal; Notable for the following components:      Result Value   Potassium 3.4 (*)    Glucose, Bld 112 (*)    All other components within normal limits  CBC - Abnormal; Notable for the following components:   RDW 11.2 (*)    All other components within normal limits  TSH  T4, FREE  POC URINE PREG, ED  POCT PREGNANCY, URINE  TROPONIN I (HIGH SENSITIVITY)  TROPONIN I (HIGH SENSITIVITY)    ____________________________________________  EKG  ED ECG REPORT I, Rudene Re, the attending physician, personally viewed and interpreted this ECG.  Normal sinus rhythm, normal intervals, normal axis, no STE or depressions, no evidence of HOCM, AV block, delta wave, ARVD, prolonged QTc, WPW, or Brugada.   ____________________________________________  RADIOLOGY  I have personally reviewed the images performed during this visit and I agree with the Radiologist's read.   Interpretation by Radiologist:  DG Chest 2 View  Result Date: 02/03/2019 CLINICAL DATA:  Palpitations EXAM: CHEST - 2 VIEW COMPARISON:  01/27/2019 FINDINGS: Heart and mediastinal contours are within normal limits. No focal opacities or effusions. No acute bony abnormality. IMPRESSION: Negative. Electronically Signed   By: Rolm Baptise M.D.   On: 02/03/2019 20:52     ____________________________________________   PROCEDURES  Procedure(s) performed: None Procedures Critical Care performed:  None ____________________________________________   INITIAL IMPRESSION / ASSESSMENT AND PLAN / ED COURSE   29 y.o. female with a history of allergy, anxiety, asthma, GERD, hiatal hernia, IBS, hypothyroidism who presents for evaluation of daily palpitations for 2 weeks.  Patient is extremely well-appearing in no distress with normal vital signs, EKG showing no evidence of dysrhythmia or ischemia, lungs are clear to auscultation, heart regular rate and rhythm with no murmurs, pulses are symmetric in all 4 extremities.  Labs show no evidence of anemia, electrolyte abnormalities, dehydration, AKI, thyroid dysfunction, or cardiac ischemia.  Chest x-ray with no evidence of pneumonia, edema, or cardiomegaly.  At this time, explained to the patient that the most important thing is to follow-up the results of the Holter monitoring to make sure that no arrhythmias have been recorded.  If that is negative I recommend follow-up with  cardiology for further evaluation.  I discussed return precautions for any episodes of chest pain, shortness of breath, syncope or near syncope.        As part of my medical decision making, I reviewed the following data within the [place] notes reviewed and incorporated, Labs reviewed , EKG interpreted , Old EKG reviewed, Old chart reviewed, Radiograph reviewed , Notes from prior ED visits and [place] Controlled Substance Database   Please note:  Patient was evaluated in Emergency Department  today for the symptoms described in the history of present illness. Patient was evaluated in the context of the global COVID-19 pandemic, which necessitated consideration that the patient might be at risk for infection with the SARS-CoV-2 virus that causes COVID-19. Institutional protocols and algorithms that pertain to the evaluation of patients at risk for COVID-19 are in a state of rapid change based on information released by regulatory bodies including the CDC and federal and state organizations. These policies and algorithms were followed during the patient's care in the ED.  Some ED evaluations and interventions may be delayed as a result of limited staffing during the pandemic.   ____________________________________________   FINAL CLINICAL IMPRESSION(S) / ED DIAGNOSES   Final diagnoses:  Palpitations      NEW MEDICATIONS STARTED DURING THIS VISIT:  ED Discharge Orders    None       Note:  This document was prepared using Dragon voice recognition software and may include unintentional dictation errors.    Alfred Levins, [place], MD 02/04/19 [phone]

## 2019-02-05 ENCOUNTER — Encounter: Payer: Self-pay | Admitting: Internal Medicine

## 2019-02-07 ENCOUNTER — Telehealth: Payer: Self-pay | Admitting: Internal Medicine

## 2019-02-07 NOTE — Telephone Encounter (Signed)
Patient had an ER visit on 1/11. Her discharge summery informed her to follow up with provider within 2 days.   LVM for patient to inform she needs to set up a ER FU with provider and to discuss FMLA being completed.

## 2019-02-07 NOTE — Telephone Encounter (Signed)
I have received FMLA via fax from [employer] for patient.

## 2019-02-07 NOTE — Telephone Encounter (Signed)
Agree 

## 2019-02-10 NOTE — Telephone Encounter (Signed)
Patient has made an appointment for 1/21.

## 2019-02-12 ENCOUNTER — Encounter: Payer: Self-pay | Admitting: Internal Medicine

## 2019-02-13 ENCOUNTER — Other Ambulatory Visit: Payer: Self-pay

## 2019-02-13 ENCOUNTER — Encounter: Payer: Self-pay | Admitting: Internal Medicine

## 2019-02-13 ENCOUNTER — Ambulatory Visit (INDEPENDENT_AMBULATORY_CARE_PROVIDER_SITE_OTHER): Payer: No Typology Code available for payment source | Admitting: Internal Medicine

## 2019-02-13 VITALS — BP 118/68 | HR 52 | Temp 99.1°F | Ht 62.0 in | Wt 146.0 lb

## 2019-02-13 DIAGNOSIS — R002 Palpitations: Secondary | ICD-10-CM | POA: Diagnosis not present

## 2019-02-13 NOTE — Progress Notes (Signed)
   Subjective:   Patient ID: Michelle Waller, female    DOB: [birth year], 29 y.o.   MRN: [id number]  HPI The patient is a 29 YO female coming in for ER follow up (in twice for palpitations, recent holter monitor without abnormal beats, labs normal in ER including thyroid levels). She had several episodes which felt like she was doing normal level of activity or mild exercise and got stuck in the adrenaline response with fast HR and feeling bad. Has stopped all caffeine which has not helped. Is stressed some but not more now than a few months ago. Denies fevers or chills. Denies chest pains.   PMH, [facility], social history reviewed and updated.   Review of Systems  Constitutional: Negative.   HENT: Negative.   Eyes: Negative.   Respiratory: Negative for cough, chest tightness and shortness of breath.   Cardiovascular: Positive for palpitations. Negative for chest pain and leg swelling.  Gastrointestinal: Negative for abdominal distention, abdominal pain, constipation, diarrhea, nausea and vomiting.  Musculoskeletal: Negative.   Skin: Negative.   Neurological: Negative.   Psychiatric/Behavioral: Negative.     Objective:  Physical Exam Constitutional:      Appearance: She is well-developed.  HENT:     Head: Normocephalic and atraumatic.  Cardiovascular:     Rate and Rhythm: Normal rate and regular rhythm.  Pulmonary:     Effort: Pulmonary effort is normal. No respiratory distress.     Breath sounds: Normal breath sounds. No wheezing or rales.  Abdominal:     General: Bowel sounds are normal. There is no distension.     Palpations: Abdomen is soft.     Tenderness: There is no abdominal tenderness. There is no rebound.  Musculoskeletal:     Cervical back: Normal range of motion.  Skin:    General: Skin is warm and dry.  Neurological:     Mental Status: She is alert and oriented to person, place, and time.     Coordination: Coordination normal.     Vitals:   02/13/19 1512  BP: 118/68   Pulse: (!) 52  Temp: 99.1 F (37.3 C)  TempSrc: Oral  SpO2: 99%  Weight: 146 lb (66.2 kg)  Height: 5\' 2"  (1.575 m)    This visit occurred during the SARS-CoV-2 public health emergency.  Safety protocols were in place, including screening questions prior to the visit, additional usage of staff PPE, and extensive cleaning of exam room while observing appropriate contact time as indicated for disinfecting solutions.   Assessment & Plan:

## 2019-02-14 DIAGNOSIS — Z0279 Encounter for issue of other medical certificate: Secondary | ICD-10-CM

## 2019-02-14 NOTE — Telephone Encounter (Signed)
Forms have been completed &Signed by provider, Faxed to matrix, Copy sent to scan &Charged for.   Original mailed to patient for her records.

## 2019-02-15 ENCOUNTER — Encounter: Payer: Self-pay | Admitting: Internal Medicine

## 2019-02-15 NOTE — Assessment & Plan Note (Signed)
Recent heart monitor without unusual cardiac activity. Checking metanephrines to rule out pheochromocytoma and some vitamin levels which may affect sensation of palpitations. Thyroid levels normal so likely not contributing to symptoms.

## 2019-02-18 ENCOUNTER — Other Ambulatory Visit: Payer: Self-pay

## 2019-02-18 ENCOUNTER — Other Ambulatory Visit (INDEPENDENT_AMBULATORY_CARE_PROVIDER_SITE_OTHER): Payer: No Typology Code available for payment source

## 2019-02-18 DIAGNOSIS — R002 Palpitations: Secondary | ICD-10-CM

## 2019-02-18 LAB — VITAMIN D 25 HYDROXY (VIT D DEFICIENCY, FRACTURES): VITD: 42.63 ng/mL (ref 30.00–100.00)

## 2019-02-18 LAB — VITAMIN B12: Vitamin B-12: >1500 pg/mL — ABNORMAL HIGH (ref 211–911)

## 2019-02-20 ENCOUNTER — Encounter (INDEPENDENT_AMBULATORY_CARE_PROVIDER_SITE_OTHER): Payer: Self-pay

## 2019-02-20 ENCOUNTER — Encounter: Payer: Self-pay | Admitting: Internal Medicine

## 2019-02-21 LAB — ANA, IFA COMPREHENSIVE PANEL
Anti Nuclear Antibody (ANA): POSITIVE — AB
ENA SM Ab Ser-aCnc: <1 AI
SM/RNP: <1 AI
SSA (Ro) (ENA) Antibody, IgG: >8 AI — AB
SSB (La) (ENA) Antibody, IgG: <1 AI
Scleroderma (Scl-70) (ENA) Antibody, IgG: <1 AI
ds DNA Ab: 1 IU/mL

## 2019-02-21 LAB — ANTI-NUCLEAR AB-TITER (ANA TITER): ANA Titer 1: 1:160 {titer} — ABNORMAL HIGH

## 2019-02-21 LAB — METANEPHRINES, PLASMA
Metanephrine, Free: <25 pg/mL (ref <=57)
Normetanephrine, Free: 62 pg/mL (ref <=148)
Total Metanephrines-Plasma: 62 pg/mL (ref <=205)

## 2019-03-04 ENCOUNTER — Encounter: Payer: Self-pay | Admitting: Internal Medicine

## 2019-03-04 DIAGNOSIS — J3089 Other allergic rhinitis: Secondary | ICD-10-CM

## 2019-03-12 ENCOUNTER — Encounter: Payer: Self-pay | Admitting: Internal Medicine

## 2019-04-15 ENCOUNTER — Other Ambulatory Visit: Payer: Self-pay

## 2019-04-15 ENCOUNTER — Encounter: Payer: Self-pay | Admitting: Allergy & Immunology

## 2019-04-15 ENCOUNTER — Ambulatory Visit: Payer: No Typology Code available for payment source | Admitting: Allergy & Immunology

## 2019-04-15 VITALS — BP 118/82 | HR 80 | Temp 98.2°F | Resp 16 | Ht 62.0 in | Wt 147.4 lb

## 2019-04-15 DIAGNOSIS — K9049 Malabsorption due to intolerance, not elsewhere classified: Secondary | ICD-10-CM | POA: Diagnosis not present

## 2019-04-15 DIAGNOSIS — J3089 Other allergic rhinitis: Secondary | ICD-10-CM

## 2019-04-15 DIAGNOSIS — J302 Other seasonal allergic rhinitis: Secondary | ICD-10-CM | POA: Diagnosis not present

## 2019-04-15 MED ORDER — MONTELUKAST SODIUM 10 MG PO TABS: 10.0000 mg | ORAL_TABLET | Freq: Every day | ORAL | 5 refills | Status: DC

## 2019-04-15 NOTE — Progress Notes (Signed)
NEW PATIENT  Date of Service/Encounter:  04/15/19  Referring provider: Hoyt Koch, MD   Assessment:   Seasonal and perennial allergic rhinitis (grasses, weeds, trees, indoor molds, dust mites, cat and dog)  Food intolerance  Plan/Recommendations:   1. Seasonal and perennial allergic rhinitis - Testing today showed: grasses, weeds, trees, indoor molds, dust mites, cat and dog - Copy of test results provided.  - Avoidance measures provided. - Continue with: Zyrtec (cetirizine) 10mg  tablet once daily - Start taking: Singulair (montelukast) 10mg  daily - You can use an extra dose of the antihistamine, if needed, for breakthrough symptoms.  - Consider nasal saline rinses 1-2 times daily to remove allergens from the nasal cavities as well as help with mucous clearance (this is especially helpful to do before the nasal sprays are given) - Consider allergy shots as a means of long-term control. - Allergy shots "re-train" and "reset" the immune system to ignore environmental allergens and decrease the resulting immune response to those allergens (sneezing, itchy watery eyes, runny nose, nasal congestion, etc).    - Allergy shots improve symptoms in 75-85% of patients.  - We can discuss more at the next appointment if the medications are not working for you.  2. Food intolerance - Testing was negative to all of the foods tested. - While food allergy testing has a terrible positive predictive value, its negative predictive value is excellent. - Therefore, we tend to truly believe negative test. - There is no need for an epinephrine autoinjector. - Despite the negative testing, you still might have food intolerance. - You can certainly avoid any foods that seem to bother you, especially as long as you are using a multivitamin. - I would continue with the probiotic that you are using. - I may consider talking to your gastroenterologist to see whether something like Linzess might  be an option (it has come on the market since you last saw your gastroenterologist).   3. Return in about 6 weeks (around 05/27/2019). This can be an in-person, a virtual Webex or a telephone follow up visit.  Subjective:   Michelle Waller is a 29 y.o. female presenting today for evaluation of  Chief Complaint  Patient presents with  . Allergic Reaction    soy    Michelle Waller has a history of the following: Patient Active Problem List   Diagnosis Date Noted  . Seasonal and perennial allergic rhinitis 04/16/2019  . Palpitations 01/15/2019  . Hypokalemia 01/15/2019  . Nonallopathic lesion of lumbosacral region 10/02/2017  . Nonallopathic lesion of sacral region 10/02/2017  . Irritable bowel syndrome 06/21/2017  . Routine general medical examination at a health care facility 06/14/2017  . 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 Nonallopathic lesion of thoracic region 04/30/2017  . Nonallopathic lesion of rib cage 04/30/2017  . Nonallopathic lesion of cervical region 04/30/2017  . ANA positive 10/31/2016  . Anxiety 08/15/2016  . Hypothyroidism 02/03/2016  . GERD (gastroesophageal reflux disease) 02/11/2015  . Kidney stones 02/11/2015    History obtained from: chart review and patient.  Michelle Waller was referred by Hoyt Koch, MD.     Michelle Waller is a 29 y.o. female presenting for an evaluation of food allergies and allergic rhinitis.  She reports having abdominal pain nearly every day.  This is frequently and throughout the entirety of the day.  It seems to be triggered by a variety of foods.  She especially notices processed or fried foods that make this worse.  She has never had an anaphylactic aside from 73.  She experienced this when she lived in [place].  They did a slew of blood work and apparently soy was the only allergen that was appreciated after all of the work.  She does not avoid it completely, but does attempt to do so. She had an EpiPen at one point, but it is  expired.  She did have a reaction approximately 2 weeks ago.  She went out to eat and had some Mediterranean food.  She experienced tongue tingling and pop some Benadryl with improvement.  There were no new food items.    She also seems to have issues with dairy.  She avoids seafood due to family history of seafood allergy.  She does eat peanut, tree nut, eggs, and wheat without issues.  She has been avoiding red meat, but this seems more like a health decision.  She had a colonoscopy in February 2020 that was completely normal.  She sees Dr. Zenovia Jarred.   She does have some allergic rhinitis symptoms.  Typically spring is the worst.  She takes cetirizine throughout the entire year.  She does feel it when she forgets to.  She does not use a nose spray.  She has no history of asthma. Otherwise, there is no history of other atopic diseases, including drug allergies, stinging insect allergies, eczema, urticaria or contact dermatitis. There is no significant infectious history. Vaccinations are up to date.    Past Medical History: Patient Active Problem List   Diagnosis Date Noted  . Seasonal and perennial allergic rhinitis 04/16/2019  . Palpitations 01/15/2019  . Hypokalemia 01/15/2019  . Nonallopathic lesion of lumbosacral region 10/02/2017  . Nonallopathic lesion of sacral region 10/02/2017  . Irritable bowel syndrome 06/21/2017  . Routine general medical examination at a health care facility 06/14/2017  . 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 Nonallopathic lesion of thoracic region 04/30/2017  . Nonallopathic lesion of rib cage 04/30/2017  . Nonallopathic lesion of cervical region 04/30/2017  . ANA positive 10/31/2016  . Anxiety 08/15/2016  . Hypothyroidism 02/03/2016  . GERD (gastroesophageal reflux disease) 02/11/2015  . Kidney stones 02/11/2015    Medication List:  Allergies as of 04/15/2019      Reactions   Soy Allergy Anaphylaxis   Large amounts cause anaphylaxis Small amounts  cause upset stomach      Medication List       Accurate as of April 15, 2019 11:59 PM. If you have any questions, ask your nurse or doctor.        STOP taking these medications   bifidobacterium infantis capsule Stopped by: Valentina Shaggy, MD     TAKE these medications   albuterol 108 (90 Base) MCG/ACT inhaler Commonly known as: VENTOLIN HFA Inhale 2 puffs into the lungs every 4 (four) hours as needed for wheezing or shortness of breath.   cholecalciferol 25 MCG (1000 UNIT) tablet Commonly known as: VITAMIN D3 Take 1,000 Units by mouth daily.   Iron (Ferrous Sulfate) 325 (65 Fe) MG Tabs   levothyroxine 50 MCG tablet Commonly known as: SYNTHROID TAKE 1 TABLET (50 MCG TOTAL) BY MOUTH DAILY BEFORE BREAKFAST. MUST KEEP SCHEDULED APPT FOR AUGUST 6 FOR FUTURE REFILLS What changed: additional instructions   LORazepam 0.5 MG tablet Commonly known as: ATIVAN Take 1 tablet (0.5 mg total) by mouth daily as needed for anxiety.   montelukast 10 MG tablet Commonly known as: SINGULAIR Take 1 tablet (10 mg total) by mouth at  bedtime. Started by: Valentina Shaggy, MD   prenatal multivitamin Tabs tablet Take 1 tablet by mouth daily at 12 noon.   PROzac 40 MG capsule Generic drug: FLUoxetine   VITAMIN B 12 PO Place 1 tablet under the tongue daily.       Birth History: non-contributory  Developmental History: non-contributory  Past Surgical History: Past Surgical History:  Procedure Laterality Date  . ADENOIDECTOMY    . COLONOSCOPY    . CYSTOSCOPY    . KNEE SURGERY       Family History: Family History  Problem Relation Age of Onset  . Depression Mother   . Atrial fibrillation Mother   . Hypertension Father   . Colon polyps Father   . Arthritis Maternal Grandmother   . Heart disease Maternal Grandmother   . Cancer Paternal Grandmother        breast  . Hypertension Paternal Grandmother   . Heart disease Paternal Grandfather   . Hypertension Paternal  Grandfather   . Diabetes Paternal Grandfather   . Diabetes Mellitus II Sister   . Hypothyroidism Sister   . Colon cancer Neg Hx   . Esophageal cancer Neg Hx   . Rectal cancer Neg Hx   . Stomach cancer Neg Hx      Social History: Juliana lives at home in a house that is just under 63 years old.  There is no water damage.  There is carpeting in the main living areas and carpeting in the bedrooms.  There is gas heating and central cooling.  There is a cat inside of the home.  There are no dust mite covers on the bedding.  There is no tobacco exposure.  She does have a HEPA filter in her home.  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  She does not live near an interstate or industrial area.   Review of Systems  Constitutional: Negative.  Negative for chills, fever, malaise/fatigue and weight loss.  HENT: Positive for congestion. Negative for ear discharge, ear pain and sore throat.   Eyes: Negative for pain, discharge and redness.  Respiratory: Negative for cough, sputum production, shortness of breath and wheezing.   Cardiovascular: Negative.  Negative for chest pain and palpitations.  Gastrointestinal: Positive for abdominal pain, heartburn and nausea. Negative for constipation, diarrhea and vomiting.  Skin: Negative.  Negative for itching and rash.  Neurological: Negative for dizziness and headaches.  Endo/Heme/Allergies: Positive for environmental allergies. Does not bruise/bleed easily.       Objective:   Blood pressure 118/82, pulse 80, temperature 98.2 F (36.8 C), temperature source Temporal, resp. rate 16, height 5\' 2"  (1.575 m), weight 147 lb 6.4 oz (66.9 kg), SpO2 98 %. Body mass index is 26.96 kg/m.   Physical Exam:   Physical Exam  Constitutional: She appears well-developed and well-nourished.  Pleasant female.  HENT:  Head: Normocephalic and atraumatic.  Right Ear: Tympanic membrane, external ear and  ear canal normal. No drainage, swelling or tenderness. Tympanic membrane is not injected, not scarred, not erythematous, not retracted and not bulging.  Left Ear: Tympanic membrane, external ear and ear canal normal. No drainage, swelling or tenderness. Tympanic membrane is not injected, not scarred, not erythematous, not retracted and not bulging.  Nose: Mucosal edema and rhinorrhea present. No nasal deformity or septal deviation. No epistaxis. Right sinus exhibits no maxillary sinus tenderness and no frontal sinus tenderness. Left sinus exhibits no maxillary sinus  tenderness and no frontal sinus tenderness.  Mouth/Throat: Uvula is midline and oropharynx is clear and moist. Mucous membranes are not pale and not dry.  Marked turbinate hypertrophy.  Eyes: Pupils are equal, round, and reactive to light. Conjunctivae and EOM are normal. Right eye exhibits no chemosis and no discharge. Left eye exhibits no chemosis and no discharge. Right conjunctiva is not injected. Left conjunctiva is not injected.  Cardiovascular: Normal rate, regular rhythm and normal heart sounds.  Respiratory: Effort normal and breath sounds normal. No accessory muscle usage. No tachypnea. No respiratory distress. She has no wheezes. She has no rhonchi. She has no rales. She exhibits no tenderness.  Moving air well in all lung fields.  No increased work of breathing.  GI: There is no abdominal tenderness. There is no rebound and no guarding.  Lymphadenopathy:       Head (right side): No submandibular, no tonsillar and no occipital adenopathy present.       Head (left side): No submandibular, no tonsillar and no occipital adenopathy present.    She has no cervical adenopathy.  Neurological: She is alert.  Skin: No abrasion, no petechiae and no rash noted. Rash is not papular, not vesicular and not urticarial. No erythema. No pallor.  No eczematous or urticarial lesions noted.  Psychiatric: She has a normal mood and affect.       Diagnostic studies:     Allergy Studies:    Airborne Adult Perc - 04/15/19 1349    Time Antigen Placed  1349    Allergen Manufacturer  Lavella Hammock    Location  Back    Number of Test  59    Panel 1  Select    1. Control-Buffer 50% Glycerol  Negative    2. Control-Histamine 1 mg/ml  Negative    3. Albumin saline  Negative    4. Union  Negative    5. [place]  Negative    6. Johnson  Negative    7. [place] Blue  Negative    8. Meadow Fescue  Negative    9. Perennial Rye  Negative    10. Sweet Vernal  Negative    11. Timothy  Negative    12. Cocklebur  Negative    13. Burweed Marshelder  Negative    14. Ragweed, short  Negative    15. Ragweed, Giant  Negative    16. Plantain,  English  Negative    17. Lamb's Quarters  2+    18. Sheep Sorrell  Negative    19. Rough Pigweed  Negative    20. Marsh Elder, Rough  Negative    21. Mugwort, Common  Negative    22. Ash mix  Negative    23. Birch mix  3+    24. Beech American  3+    25. Box, Elder  3+    26. Cedar, red  Negative    27. Cottonwood, [place]  Negative    28. Elm mix  4+    29. Hickory mix  Negative    30. Maple mix  2+    31. Oak, [place] mix  3+    32. Pecan Pollen  3+    33. Pine mix  2+    34. Sycamore Eastern  3+    35. Lyman, Black Pollen  4+    36. Alternaria alternata  Negative    37. Cladosporium Herbarum  Negative    38. Aspergillus mix  Negative    39. Penicillium  mix  Negative    40. Bipolaris sorokiniana (Helminthosporium)  Negative    41. Drechslera spicifera (Curvularia)  Negative    42. Mucor plumbeus  Negative    43. Fusarium moniliforme  Negative    44. Aureobasidium pullulans (pullulara)  Negative    45. Rhizopus oryzae  Negative    46. Botrytis cinera  Negative    47. Epicoccum nigrum  Negative    49. Candida Albicans  Negative    50. Trichophyton mentagrophytes  Negative    51. Mite, D Farinae  5,000 AU/ml  2+    52. Mite, D Pteronyssinus  5,000 AU/ml  3+    53. Cat Hair 10,000 BAU/ml   Negative    54.  Dog Epithelia  Negative    55. Mixed Feathers  Negative    56. Horse Epithelia  Negative    57. Cockroach, German  Negative    58. Mouse  Negative    59. Tobacco Leaf  Negative     Intradermal - 04/15/19 1524    Time Antigen Placed  1525    Allergen Manufacturer  Lavella Hammock    Location  Arm    Number of Test  12    Control  Negative    [place]  2+    Johnson  Negative    7 Grass  2+    Ragweed mix  Negative    Mold 1  Negative    Mold 2  2+    Mold 3  Negative    Mold 4  3+    Cat  2+    Dog  3+    Cockroach  1+     Food Adult Perc - 04/15/19 1400    Time Antigen Placed  1402    Allergen Manufacturer  Lavella Hammock    Location  Back    Number of allergen test  20    2. Soybean  Negative    3. Wheat  Negative    5. Milk, cow  Negative    6. Egg White, Chicken  Negative    8. Shellfish Mix  Negative    9. Fish Mix  Negative    15. [place] nut  Negative    17. Pistachio  Negative    25. Shrimp  Negative    26. Crab  Negative    32. Rye   Negative    33. Hops  Negative    49. Onion  Negative    50. Cabbage  Negative    61. Cantaloupe  Negative    64. Chocolate/Cacao bean  Negative    65. Karaya Gum  Negative    68. Nutmeg  Negative    69. Ginger  Negative       Allergy testing results were read and interpreted by myself, documented by clinical staff.         Salvatore Marvel, MD [facility]

## 2019-04-15 NOTE — Patient Instructions (Addendum)
1. Seasonal and perennial allergic rhinitis - Testing today showed: grasses, weeds, trees, indoor molds, dust mites, cat and dog - Copy of test results provided.  - Avoidance measures provided. - Continue with: Zyrtec (cetirizine) 10mg  tablet once daily - Start taking: Singulair (montelukast) 10mg  daily - You can use an extra dose of the antihistamine, if needed, for breakthrough symptoms.  - Consider nasal saline rinses 1-2 times daily to remove allergens from the nasal cavities as well as help with mucous clearance (this is especially helpful to do before the nasal sprays are given) - Consider allergy shots as a means of long-term control. - Allergy shots "re-train" and "reset" the immune system to ignore environmental allergens and decrease the resulting immune response to those allergens (sneezing, itchy watery eyes, runny nose, nasal congestion, etc).    - Allergy shots improve symptoms in 75-85% of patients.  - We can discuss more at the next appointment if the medications are not working for you.  2. Food intolerance - Testing was negative to all of the foods tested. - While food allergy testing has a terrible positive predictive value, its negative predictive value is excellent. - Therefore, we tend to truly believe negative test. - There is no need for an epinephrine autoinjector. - Despite the negative testing, you still might have food intolerance. - You can certainly avoid any foods that seem to bother you, especially as long as you are using a multivitamin. - I would continue with the probiotic that you are using. - I may consider talking to your gastroenterologist to see whether something like Linzess might be an option (it has come on the market since you last saw your gastroenterologist).   3. Return in about 6 weeks (around 05/27/2019). This can be an in-person, a virtual Webex or a telephone follow up visit.   Please inform us of any Emergency Department visits,  hospitalizations, or changes in symptoms. Call us before going to the ED for breathing or allergy symptoms since we might be able to fit you in for a sick visit. Feel free to contact us anytime with any questions, problems, or concerns.  It was a pleasure to meet you today!  Websites that have reliable patient information: 1. American Academy of Asthma, Allergy, and Immunology: www.aaaai.org 2. Food Allergy Research and Education (FARE): foodallergy.org 3. Mothers of Asthmatics: http://www.asthmacommunitynetwork.org 4. American College of Allergy, Asthma, and Immunology: www.acaai.org   COVID-19 Vaccine Information can be found at: ShippingScam.co.uk For questions related to vaccine distribution or appointments, please email [email or web address] or call [phone].     "Like" [place] on Facebook and Instagram for our latest updates!       HAPPY SPRING!  Make sure you are registered to vote! If you have moved or changed any of your contact information, you will need to get this updated before voting!  In some cases, you MAY be able to register to vote online: [email or web address]    Reducing Pollen Exposure  The American Academy of Allergy, Asthma and Immunology suggests the following steps to reduce your exposure to pollen during allergy seasons.    1. Do not hang sheets or clothing out to dry; pollen may collect on these items. 2. Do not mow lawns or spend time around freshly cut grass; mowing stirs up pollen. 3. Keep windows closed at night.  Keep car windows closed while driving. 4. Minimize morning activities outdoors, a time when pollen counts are usually at their highest. 5. Stay indoors as much as possible  when pollen counts or humidity is high and on windy days when pollen tends to remain in the air longer. 6. Use air conditioning when possible.  Many air conditioners have filters that  trap the pollen spores. 7. Use a HEPA room air filter to remove pollen form the indoor air you breathe.  Control of Mold Allergen   Mold and fungi can grow on a variety of surfaces provided certain temperature and moisture conditions exist.  Outdoor molds grow on plants, decaying vegetation and soil.  The major outdoor mold, Alternaria and Cladosporium, are found in very high numbers during hot and dry conditions.  Generally, a late Summer - Fall peak is seen for common outdoor fungal spores.  Rain will temporarily lower outdoor mold spore count, but counts rise rapidly when the rainy period ends.  The most important indoor molds are Aspergillus and Penicillium.  Dark, humid and poorly ventilated basements are ideal sites for mold growth.  The next most common sites of mold growth are the bathroom and the kitchen.    Indoor (Perennial) Mold Control   Positive indoor molds via skin testing: Aspergillus, Penicillium, Fusarium, Aureobasidium (Pullulara) and Rhizopus  1. Maintain humidity below 50%. 2. Clean washable surfaces with 5% bleach solution. 3. Remove sources e.g. contaminated carpets.     Control of Dog or Cat Allergen  Avoidance is the best way to manage a dog or cat allergy. If you have a dog or cat and are allergic to dog or cats, consider removing the dog or cat from the home. If you have a dog or cat but don't want to find it a new home, or if your family wants a pet even though someone in the household is allergic, here are some strategies that may help keep symptoms at bay:  1. Keep the pet out of your bedroom and restrict it to only a few rooms. Be advised that keeping the dog or cat in only one room will not limit the allergens to that room. 2. Don't pet, hug or kiss the dog or cat; if you do, wash your hands with soap and water. 3. High-efficiency particulate air (HEPA) cleaners run continuously in a bedroom or living room can reduce allergen levels over time. 4. Regular  use of a high-efficiency vacuum cleaner or a central vacuum can reduce allergen levels. 5. Giving your dog or cat a bath at least once a week can reduce airborne allergen.  Control of Dust Mite Allergen    Dust mites play a major role in allergic asthma and rhinitis.  They occur in environments with high humidity wherever human skin is found.  Dust mites absorb humidity from the atmosphere (ie, they do not drink) and feed on organic matter (including shed human and animal skin).  Dust mites are a microscopic type of insect that you cannot see with the naked eye.  High levels of dust mites have been detected from mattresses, pillows, carpets, upholstered furniture, bed covers, clothes, soft toys and any woven material.  The principal allergen of the dust mite is found in its feces.  A gram of dust may contain 1,000 mites and 250,000 fecal particles.  Mite antigen is easily measured in the air during house cleaning activities.  Dust mites do not bite and do not cause harm to humans, other than by triggering allergies/asthma.    Ways to decrease your exposure to dust mites in your home:  1. Encase mattresses, box springs and pillows with a mite-impermeable barrier  or cover   2. Wash sheets, blankets and drapes weekly in hot water (130 F) with detergent and dry them in a dryer on the hot setting.  3. Have the room cleaned frequently with a vacuum cleaner and a damp dust-mop.  For carpeting or rugs, vacuuming with a vacuum cleaner equipped with a high-efficiency particulate air (HEPA) filter.  The dust mite allergic individual should not be in a room which is being cleaned and should wait 1 hour after cleaning before going into the room. 4. Do not sleep on upholstered furniture (eg, couches).   5. If possible removing carpeting, upholstered furniture and drapery from the home is ideal.  Horizontal blinds should be eliminated in the rooms where the person spends the most time (bedroom, study, television  room).  Washable vinyl, roller-type shades are optimal. 6. Remove all non-washable stuffed toys from the bedroom.  Wash stuffed toys weekly like sheets and blankets above.   7. Reduce indoor humidity to less than 50%.  Inexpensive humidity monitors can be purchased at most hardware stores.  Do not use a humidifier as can make the problem worse and are not recommended.

## 2019-04-16 ENCOUNTER — Encounter: Payer: Self-pay | Admitting: Allergy & Immunology

## 2019-04-16 DIAGNOSIS — J3089 Other allergic rhinitis: Secondary | ICD-10-CM | POA: Insufficient documentation

## 2019-04-16 DIAGNOSIS — J302 Other seasonal allergic rhinitis: Secondary | ICD-10-CM | POA: Insufficient documentation

## 2019-04-28 ENCOUNTER — Encounter: Payer: Self-pay | Admitting: Allergy & Immunology

## 2019-04-28 ENCOUNTER — Encounter: Payer: Self-pay | Admitting: Internal Medicine

## 2019-04-28 DIAGNOSIS — R002 Palpitations: Secondary | ICD-10-CM

## 2019-04-28 DIAGNOSIS — R0789 Other chest pain: Secondary | ICD-10-CM

## 2019-04-29 ENCOUNTER — Encounter: Payer: Self-pay | Admitting: Internal Medicine

## 2019-05-02 ENCOUNTER — Encounter: Payer: Self-pay | Admitting: Emergency Medicine

## 2019-05-02 ENCOUNTER — Other Ambulatory Visit: Payer: Self-pay

## 2019-05-02 ENCOUNTER — Telehealth: Payer: No Typology Code available for payment source | Admitting: Physician Assistant

## 2019-05-02 ENCOUNTER — Ambulatory Visit
Admission: EM | Admit: 2019-05-02 | Discharge: 2019-05-02 | Disposition: A | Discharge status: Home / Self Care | Payer: No Typology Code available for payment source | Attending: Family Medicine | Admitting: Family Medicine

## 2019-05-02 DIAGNOSIS — R0982 Postnasal drip: Secondary | ICD-10-CM

## 2019-05-02 DIAGNOSIS — J4521 Mild intermittent asthma with (acute) exacerbation: Secondary | ICD-10-CM

## 2019-05-02 DIAGNOSIS — Z9109 Other allergy status, other than to drugs and biological substances: Secondary | ICD-10-CM

## 2019-05-02 DIAGNOSIS — R0602 Shortness of breath: Secondary | ICD-10-CM

## 2019-05-02 DIAGNOSIS — R059 Cough, unspecified: Secondary | ICD-10-CM

## 2019-05-02 DIAGNOSIS — R05 Cough: Secondary | ICD-10-CM | POA: Diagnosis not present

## 2019-05-02 HISTORY — DX: Sjogren syndrome, unspecified: M35.00

## 2019-05-02 MED ORDER — PREDNISONE 20 MG PO TABS: 20.0000 mg | ORAL_TABLET | Freq: Every day | ORAL | 0 refills | Status: DC

## 2019-05-02 MED ORDER — AZELASTINE HCL 0.1 % NA SOLN: 1.0000 | Freq: Two times a day (BID) | NASAL | 0 refills | Status: DC

## 2019-05-02 MED ORDER — ALBUTEROL SULFATE HFA 108 (90 BASE) MCG/ACT IN AERS: 2.0000 | INHALATION_SPRAY | RESPIRATORY_TRACT | 0 refills | Status: DC | PRN

## 2019-05-02 MED ORDER — BENZONATATE 100 MG PO CAPS: 100.0000 mg | ORAL_CAPSULE | Freq: Three times a day (TID) | ORAL | 0 refills | Status: DC | PRN

## 2019-05-02 NOTE — Progress Notes (Signed)
E-Visit for Corona Virus Screening  Your current symptoms could be consistent with the coronavirus.  Many health care providers can now test patients at their office but not all are.  [place] has multiple testing sites. For information on our [place] testing locations and hours go to [email or web address]  We are enrolling you in our [place] for [place] . Daily you will receive a questionnaire within the [facility] website. Our COVID 19 response team will be monitoring your responses daily.  Testing Information: The COVID-19 Community Testing sites will begin testing BY APPOINTMENT ONLY.  You can schedule online at [email or web address]  If you do not have access to a smart phone or computer you may call [phone] for an appointment.   Additional testing sites in the Community:  . For [facility] Testing sites in [place]  FaceUpdate.uy  . For Pop-up testing sites in [place]  [email or web address].[email or web address]  . For Testing sites with regular hours [email or web address]  . For [place] [email or web address]  . For [facility] [email or web address]  . For [facility] testing in [place] and [employer] [email or web address]  . For [facility] testing in [facility]   [email or web address]  For  more information about community testing call [phone]   Please quarantine yourself while awaiting your test results. Please stay home for a minimum of 10 days from the first day of illness with improving symptoms and you have had 24 hours of no fever (without the use of Tylenol (Acetaminophen)  Motrin (Ibuprofen) or any fever reducing medication).  Also - Do not get tested prior to returning to work because once you have had a positive test the test can stay positive for more then a month in some cases.   You should wear a mask or cloth face covering over your nose and mouth if you must be around other people or animals, including pets (even at home). Try to stay at least 6 feet away from other people. This will protect the people around you.  Please continue good preventive care measures, including:  frequent hand-washing, avoid touching your face, cover coughs/sneezes, stay out of crowds and keep a 6 foot distance from others.  COVID-19 is a respiratory illness with symptoms that are similar to the flu. Symptoms are typically mild to moderate, but there have been cases of severe illness and death due to the virus.   The following symptoms may appear 2-14 days after exposure: . Fever . Cough . Shortness of breath or difficulty breathing . Chills . Repeated shaking with chills . Muscle pain . Headache . Sore throat . New loss of taste or smell . Fatigue . Congestion or runny nose . Nausea or vomiting . Diarrhea  Go to the nearest hospital ED for assessment if fever/cough/breathlessness are severe or illness seems like a threat to life.  It is vitally important that if you feel that you have an infection such as this virus or any other virus that you stay home and away from places where you may spread it to others.  You should avoid contact with people age 47 and older.    You can take your Cetirizine daily, as directed by your allergist.  You can use over-the-counter Flonase (or generic) for post nasal drip and runny nose.  You can use the following prescription medications I sent to your pharmacy:  A prescription cough medication called Tessalon Perles 100 mg. You may take 1-2 capsules every 8 hours as needed for cough and  A prescription inhaler called Albuterol MDI 90 mcg  /actuation 2 puffs every 4 hours as needed for shortness of breath, wheezing, cough  You may also take acetaminophen (Tylenol) as needed for fever.  Reduce your risk of any infection by using the same precautions used for avoiding the common cold or flu:  Marland Kitchen Wash your hands often with soap and warm water for at least 20 seconds.  If soap and water are not readily available, use an alcohol-based hand sanitizer with at least 60% alcohol.  . If coughing or sneezing, cover your mouth and nose by coughing or sneezing into the elbow areas of your shirt or coat, into a tissue or into your sleeve (not your hands). . Avoid shaking hands with others and consider head nods or verbal greetings only. . Avoid touching your eyes, nose, or mouth with unwashed hands.  . Avoid close contact with people who are sick. . Avoid places or events with large numbers of people in one location, like concerts or sporting events. . Carefully consider travel plans you have or are making. . If you are planning any travel outside or inside the [place], visit the CDC's Travelers' Health webpage for the latest health notices. . If you have some symptoms but not all symptoms, continue to monitor at home and seek medical attention if your symptoms worsen. . If you are having a medical emergency, call 911.  HOME CARE . Only take medications as instructed by your medical team. . Drink plenty of fluids and get plenty of rest. . A steam or ultrasonic humidifier can help if you have congestion.   GET HELP RIGHT AWAY IF YOU HAVE EMERGENCY WARNING SIGNS** FOR COVID-19. If you or someone is showing any of these signs seek emergency medical care immediately. Call 911 or proceed to your closest emergency facility if: . You develop worsening high fever. . Trouble breathing . Bluish lips or face . Persistent pain or pressure in the chest . New confusion . Inability to wake or stay awake . You cough up blood. . Your symptoms become more  severe  **This list is not all possible symptoms. Contact your medical provider for any symptoms that are sever or concerning to you.  MAKE SURE YOU   Understand these instructions.  Will watch your condition.  Will get help right away if you are not doing well or get worse.  Your e-visit answers were reviewed by a board certified advanced clinical practitioner to complete your personal care plan.  Depending on the condition, your plan could have included both over the counter or prescription medications.  If there is a problem please reply once you have received a response from your provider.  Your safety is important to [place].  If you have drug allergies check your prescription carefully.    You can use MyChart to ask questions about today's visit, request a non-urgent call back, or ask for a work or school excuse for 24 hours related to this e-Visit. If it has been greater than 24 hours you will need to follow up with your provider, or enter a new e-Visit to address those concerns. You will get an e-mail in the next two days asking about your experience.  I hope that your e-visit has been valuable and will speed your recovery. Thank you for using e-visits.   Greater than 5 minutes, yet less than 10 minutes of time have been spent researching, coordinating and implementing care for this patient today.

## 2019-05-02 NOTE — Discharge Instructions (Signed)
Continue your other allergy medications

## 2019-05-02 NOTE — ED Triage Notes (Signed)
Patient in today c/o 1 week history of PND and chest congestion. Patient states her mucous is green. Patient denies cough or fever.

## 2019-05-06 ENCOUNTER — Encounter: Payer: Self-pay | Admitting: Cardiology

## 2019-05-06 ENCOUNTER — Ambulatory Visit (INDEPENDENT_AMBULATORY_CARE_PROVIDER_SITE_OTHER): Payer: No Typology Code available for payment source | Admitting: Cardiology

## 2019-05-06 ENCOUNTER — Other Ambulatory Visit: Payer: Self-pay

## 2019-05-06 VITALS — BP 100/68 | HR 84 | Ht 62.0 in | Wt 144.0 lb

## 2019-05-06 DIAGNOSIS — R002 Palpitations: Secondary | ICD-10-CM

## 2019-05-06 DIAGNOSIS — R072 Precordial pain: Secondary | ICD-10-CM | POA: Diagnosis not present

## 2019-05-06 DIAGNOSIS — R079 Chest pain, unspecified: Secondary | ICD-10-CM

## 2019-05-06 NOTE — Progress Notes (Signed)
Cardiology Office Note:    Date:  05/06/2019   ID:  Michelle Waller, DOB 1990/04/15, MRN [id number]  PCP:  Hoyt Koch, MD  Cardiologist:  Kate Sable, MD  Electrophysiologist:  None   Referring MD: Hoyt Koch, *   Chief Complaint  Patient presents with  . New Patient (Initial Visit)    Referred by PCP for cehst tightness and Palpitations. Meds reviewed verbally with patient.    Michelle Waller is a 29 y.o. female who is being seen today for the evaluation of palpitations and chest pain at the request of Hoyt Koch, *.  History of Present Illness:    Michelle Waller is a 29 y.o. female with a hx of anxiety, GERD, hiatal hernia who presents due to palpitations and chest pain.  Patient states having symptoms of chest pain ongoing for the past 5 months.  Symptoms are usually worse with exertion but sometimes occur at rest.  She used to exercise and workout but has not done so of late due to being worried about chest discomfort.  She rates the pain as roughly 0-2 at rest and increases to a 6 out of 10 with exertion.  Pain is on the left side of her chest and sometimes epigastric, has a sharp component.  She finds it hard to distinguish this pain from what she typically has with regards to reflux disease.  She is being managed by gastroenterology for GERD/hiatal hernia reflux symptoms.  She is also noticed palpitations associated with chest discomfort.  Symptoms typically last couple of seconds and then go away.  Symptoms alcohol daily.  She had  a recent 3-day cardiac monitor placed on 01/28/2019, reviewed by myself, which did not show any significant arrhythmias.  Patient triggered events were associated with sinus rhythm.  Past Medical History:  Diagnosis Date  . Allergy   . Anxiety   . Asthma    physical induced asthma   . Eating disorder   . Frequent headaches   . Gastritis   . GERD (gastroesophageal reflux disease)   . Hiatal hernia   . IBS (irritable  bowel syndrome)   . Kidney stones   . Ovarian cyst   . Sjogren's disease ([place])   . Thyroid disease   . UTI (urinary tract infection)   . Vision abnormalities     Past Surgical History:  Procedure Laterality Date  . ADENOIDECTOMY    . COLONOSCOPY    . CYSTOSCOPY    . KNEE SURGERY      Current Medications: Current Meds  Medication Sig  . albuterol (PROVENTIL HFA;VENTOLIN HFA) 108 (90 Base) MCG/ACT inhaler Inhale 2 puffs into the lungs every 4 (four) hours as needed for wheezing or shortness of breath.  Marland Kitchen azelastine (ASTELIN) 0.1 % nasal spray Place 1 spray into both nostrils 2 (two) times daily. Use in each nostril as directed  . cholecalciferol (VITAMIN D3) 25 MCG (1000 UT) tablet Take 1,000 Units by mouth daily.  . Cyanocobalamin (VITAMIN B 12 PO) Place 1 tablet under the tongue daily.   Marland Kitchen FLUoxetine (PROZAC) 40 MG capsule   . Iron, Ferrous Sulfate, 325 (65 Fe) MG TABS   . levothyroxine (SYNTHROID) 50 MCG tablet TAKE 1 TABLET (50 MCG TOTAL) BY MOUTH DAILY BEFORE BREAKFAST. MUST KEEP SCHEDULED APPT FOR AUGUST 6 FOR FUTURE REFILLS (Patient taking differently: Take 50 mcg by mouth daily before breakfast. )  . LORazepam (ATIVAN) 0.5 MG tablet Take 1 tablet (0.5 mg total) by mouth daily as  needed for anxiety.  . Prenatal Vit-Fe Fumarate-FA (PRENATAL MULTIVITAMIN) TABS tablet Take 1 tablet by mouth daily at 12 noon.     Allergies:   Soy allergy   Social History   Socioeconomic History  . Marital status: Married    Spouse name: Not on file  . Number of children: Not on file  . Years of education: Not on file  . Highest education level: Not on file  Occupational History  . Not on file  Tobacco Use  . Smoking status: Former Smoker    Quit date: 11/09/2011    Years since quitting: 7.4  . Smokeless tobacco: Never Used  Substance and Sexual Activity  . Alcohol use: Not Currently    Alcohol/week: 0.0 standard drinks    Comment: occasionally   . Drug use: No  . Sexual activity:  Not on file  Other Topics Concern  . Not on file  Social History Narrative  . Not on file   Social Determinants of Health   Financial Resource Strain:   . Difficulty of Paying Living Expenses:   Food Insecurity:   . Worried About Charity fundraiser in the Last Year:   . Arboriculturist in the Last Year:   Transportation Needs:   . Film/video editor (Medical):   Marland Kitchen Lack of Transportation (Non-Medical):   Physical Activity:   . Days of Exercise per Week:   . Minutes of Exercise per Session:   Stress:   . Feeling of Stress :   Social Connections:   . Frequency of Communication with Friends and Family:   . Frequency of Social Gatherings with Friends and Family:   . Attends Religious Services:   . Active Member of Clubs or Organizations:   . Attends Archivist Meetings:   Marland Kitchen Marital Status:      Family History: The patient's family history includes Arthritis in her maternal grandmother; Atrial fibrillation in her mother; Cancer in her paternal grandmother; Colon polyps in her father; Depression in her mother; Diabetes in her paternal grandfather; Diabetes Mellitus II in her sister; Heart disease in her maternal grandmother and paternal grandfather; Hypertension in her father, paternal grandfather, and paternal grandmother; Hypothyroidism in her sister. There is no history of Colon cancer, Esophageal cancer, Rectal cancer, or Stomach cancer.  ROS:   Please see the history of present illness.     All other systems reviewed and are negative.  EKGs/Labs/Other Studies Reviewed:    The following studies were reviewed today:   EKG:  EKG is  ordered today.  The ekg ordered today demonstrates sinus rhythm, short PR otherwise normal ECG.  Recent Labs: 09/24/2018: ALT 18 01/28/2019: Magnesium 2.2 02/03/2019: BUN 14; Creatinine, Ser 0.71; Hemoglobin 13.1; Platelets 213; Potassium 3.4; Sodium 140; TSH 3.693  Recent Lipid Panel    Component Value Date/Time   CHOL 180  08/29/2018 1316   TRIG 144.0 08/29/2018 1316   HDL 63.70 08/29/2018 1316   CHOLHDL 3 08/29/2018 1316   VLDL 28.8 08/29/2018 1316   LDLCALC 88 08/29/2018 1316    Physical Exam:    VS:  BP 100/68 (BP Location: Right Arm, Patient Position: Sitting, Cuff Size: Normal)   Pulse 84   Ht 5\' 2"  (1.575 m)   Wt 144 lb (65.3 kg)   LMP 04/11/2019 (Exact Date)   SpO2 98%   BMI 26.34 kg/m     Wt Readings from Last 3 Encounters:  05/06/19 144 lb (65.3 kg)  05/02/19 144  lb (65.3 kg)  04/15/19 147 lb 6.4 oz (66.9 kg)     GEN:  Well nourished, well developed in no acute distress HEENT: Normal NECK: No JVD; No carotid bruits LYMPHATICS: No lymphadenopathy CARDIAC: RRR, no murmurs, rubs, gallops RESPIRATORY:  Clear to auscultation without rales, wheezing or rhonchi  ABDOMEN: Soft, non-tender, non-distended MUSCULOSKELETAL:  No edema; No deformity  SKIN: Warm and dry NEUROLOGIC:  Alert and oriented x 3 PSYCHIATRIC:  Normal affect   ASSESSMENT:    1. Chest pain of uncertain etiology   2. Palpitations   3. Precordial pain    PLAN:    In order of problems listed above:  1. Patient with atypical chest pain symptoms.  Symptoms have some anginal component to with with getting worse on exertion.  Difficult to ascertain if this is secondary to reflux, esophageal spasm or cardiac in etiology.  Patient is extremely worried regarding cardiac chest pain which has compromised her activities of daily living, patient not wanting to exercise and do what she normally does.  We will get an echocardiogram and Lexiscan Myoview to evaluate chest pain.  If cardiac testing is normal I believe this will go a long way to reassure patient so she can resume her normal functioning. 2. She has a history of palpitations.  Recent cardiac monitor reviewed by myself did not show any significant arrhythmias.  Patient triggered events were associated with sinus rhythm.  Results reviewed with patient.  Patient  reassured.  Follow up after echo and Myoview.  This note was generated in part or whole with voice recognition software. Voice recognition is usually quite accurate but there are transcription errors that can and very often do occur. I apologize for any typographical errors that were not detected and corrected.  Medication Adjustments/Labs and Tests Ordered: Current medicines are reviewed at length with the patient today.  Concerns regarding medicines are outlined above.  Orders Placed This Encounter  Procedures  . NM Myocar Multi W/Spect W/Wall Motion / EF  . EKG 12-Lead  . ECHOCARDIOGRAM COMPLETE   No orders of the defined types were placed in this encounter.   Patient Instructions  Medication Instructions:  Your physician recommends that you continue on your current medications as directed. Please refer to the Current Medication list given to you today.  *If you need a refill on your cardiac medications before your next appointment, please call your pharmacy*   Lab Work: None ordered If you have labs (blood work) drawn today and your tests are completely normal, you will receive your results only by: Marland Kitchen MyChart Message (if you have MyChart) OR . A paper copy in the mail If you have any lab test that is abnormal or we need to change your treatment, we will call you to review the results.   Testing/Procedures: Your physician has requested that you have an echocardiogram. Echocardiography is a painless test that uses sound waves to create images of your heart. It provides your doctor with information about the size and shape of your heart and how well your heart's chambers and valves are working. This procedure takes approximately one hour. There are no restrictions for this procedure.  Your physician has requested that you have a lexiscan myoview. For further information please visit [email or web address]. Please follow instruction sheet, as given.     Follow-Up: At [facility], you and your health needs are our priority.  As part of our continuing mission to provide you with exceptional heart care, we have created  designated Provider Care Teams.  These Care Teams include your primary Cardiologist (physician) and Advanced Practice Providers (APPs -  Physician Assistants and Nurse Practitioners) who all work together to provide you with the care you need, when you need it.  We recommend signing up for the patient portal called "MyChart".  Sign up information is provided on this After Visit Summary.  MyChart is used to connect with patients for Virtual Visits (Telemedicine).  Patients are able to view lab/test results, encounter notes, upcoming appointments, etc.  Non-urgent messages can be sent to your provider as well.   To learn more about what you can do with MyChart, go to [email or web address].    Your next appointment:   After testing   The format for your next appointment:   In Person  Provider:    You may see Kate Sable, MD or one of the following Advanced Practice Providers on your designated Care Team:    Murray Hodgkins, NP  Christell Faith, PA-C  Marrianne Mood, PA-C    Other Instructions [place]  Your caregiver has ordered a Stress Test with nuclear imaging. The purpose of this test is to evaluate the blood supply to your heart muscle. This procedure is referred to as a "Non-Invasive Stress Test." This is because other than having an IV started in your vein, nothing is inserted or "invades" your body. Cardiac stress tests are done to find areas of poor blood flow to the heart by determining the extent of coronary artery disease (CAD). Some patients exercise on a treadmill, which naturally increases the blood flow to your heart, while others who are  unable to walk on a treadmill due to physical limitations have a pharmacologic/chemical stress agent called Lexiscan . This medicine will mimic walking on a treadmill by temporarily  increasing your coronary blood flow.   Please note: these test may take anywhere between 2-4 hours to complete  PLEASE REPORT TO [place] AT THE FIRST DESK WILL DIRECT YOU WHERE TO GO  Date of Procedure:_____________________________________  Arrival Time for Procedure:______________________________  Instructions regarding medication:    PLEASE NOTIFY THE OFFICE AT LEAST 24 HOURS IN ADVANCE IF YOU ARE UNABLE TO KEEP YOUR APPOINTMENT.  [phone] AND  PLEASE NOTIFY NUCLEAR MEDICINE AT [facility] AT LEAST 24 HOURS IN ADVANCE IF YOU ARE UNABLE TO KEEP YOUR APPOINTMENT. [phone]  How to prepare for your Myoview test:  1. Do not eat or drink after midnight 2. No caffeine for 24 hours prior to test 3. No smoking 24 hours prior to test. 4. Your medication may be taken with water.  If your doctor stopped a medication because of this test, do not take that medication. 5. Ladies, please do not wear dresses.  Skirts or pants are appropriate. Please wear a short sleeve shirt. 6. No perfume, cologne or lotion. 7. Wear comfortable walking shoes. No heels!               Signed, Kate Sable, MD  05/06/2019 10:28 AM    [place]

## 2019-05-06 NOTE — Patient Instructions (Signed)
Medication Instructions:  Your physician recommends that you continue on your current medications as directed. Please refer to the Current Medication list given to you today.  *If you need a refill on your cardiac medications before your next appointment, please call your pharmacy*   Lab Work: None ordered If you have labs (blood work) drawn today and your tests are completely normal, you will receive your results only by: Marland Kitchen MyChart Message (if you have MyChart) OR . A paper copy in the mail If you have any lab test that is abnormal or we need to change your treatment, we will call you to review the results.   Testing/Procedures: Your physician has requested that you have an echocardiogram. Echocardiography is a painless test that uses sound waves to create images of your heart. It provides your doctor with information about the size and shape of your heart and how well your heart's chambers and valves are working. This procedure takes approximately one hour. There are no restrictions for this procedure.  Your physician has requested that you have a lexiscan myoview. For further information please visit [email or web address]. Please follow instruction sheet, as given.     Follow-Up: At [facility], you and your health needs are our priority.  As part of our continuing mission to provide you with exceptional heart care, we have created designated Provider Care Teams.  These Care Teams include your primary Cardiologist (physician) and Advanced Practice Providers (APPs -  Physician Assistants and Nurse Practitioners) who all work together to provide you with the care you need, when you need it.  We recommend signing up for the patient portal called "MyChart".  Sign up information is provided on this After Visit Summary.  MyChart is used to connect with patients for Virtual Visits (Telemedicine).  Patients are able to view lab/test results, encounter notes, upcoming appointments, etc.   Non-urgent messages can be sent to your provider as well.   To learn more about what you can do with MyChart, go to [email or web address].    Your next appointment:   After testing   The format for your next appointment:   In Person  Provider:    You may see Kate Sable, MD or one of the following Advanced Practice Providers on your designated Care Team:    Murray Hodgkins, NP  Christell Faith, PA-C  Marrianne Mood, PA-C    Other Instructions [place]  Your caregiver has ordered a Stress Test with nuclear imaging. The purpose of this test is to evaluate the blood supply to your heart muscle. This procedure is referred to as a "Non-Invasive Stress Test." This is because other than having an IV started in your vein, nothing is inserted or "invades" your body. Cardiac stress tests are done to find areas of poor blood flow to the heart by determining the extent of coronary artery disease (CAD). Some patients exercise on a treadmill, which naturally increases the blood flow to your heart, while others who are  unable to walk on a treadmill due to physical limitations have a pharmacologic/chemical stress agent called Lexiscan . This medicine will mimic walking on a treadmill by temporarily increasing your coronary blood flow.   Please note: these test may take anywhere between 2-4 hours to complete  PLEASE REPORT TO [place] AT THE FIRST DESK WILL DIRECT YOU WHERE TO GO  Date of Procedure:_____________________________________  Arrival Time for Procedure:______________________________  Instructions regarding medication:    PLEASE NOTIFY THE OFFICE AT  LEAST 24 HOURS IN ADVANCE IF YOU ARE UNABLE TO KEEP YOUR APPOINTMENT.  [phone] AND  PLEASE NOTIFY NUCLEAR MEDICINE AT [facility] AT LEAST 24 HOURS IN ADVANCE IF YOU ARE UNABLE TO KEEP YOUR APPOINTMENT. [phone]  How to prepare for your Myoview test:  1. Do not eat or drink after  midnight 2. No caffeine for 24 hours prior to test 3. No smoking 24 hours prior to test. 4. Your medication may be taken with water.  If your doctor stopped a medication because of this test, do not take that medication. 5. Ladies, please do not wear dresses.  Skirts or pants are appropriate. Please wear a short sleeve shirt. 6. No perfume, cologne or lotion. 7. Wear comfortable walking shoes. No heels!

## 2019-05-07 ENCOUNTER — Ambulatory Visit: Payer: No Typology Code available for payment source | Admitting: Internal Medicine

## 2019-05-13 ENCOUNTER — Encounter
Admission: RE | Admit: 2019-05-13 | Discharge: 2019-05-13 | Disposition: A | Discharge status: Home / Self Care | Payer: No Typology Code available for payment source | Source: Ambulatory Visit | Attending: Cardiology | Admitting: Cardiology

## 2019-05-13 ENCOUNTER — Other Ambulatory Visit: Payer: Self-pay

## 2019-05-13 DIAGNOSIS — R079 Chest pain, unspecified: Secondary | ICD-10-CM | POA: Insufficient documentation

## 2019-05-13 DIAGNOSIS — R072 Precordial pain: Secondary | ICD-10-CM | POA: Insufficient documentation

## 2019-05-13 DIAGNOSIS — R002 Palpitations: Secondary | ICD-10-CM | POA: Diagnosis not present

## 2019-05-13 LAB — NM MYOCAR MULTI W/SPECT W/WALL MOTION / EF
LV dias vol: 64 mL (ref 46–106)
LV sys vol: 13 mL
Peak HR: 131 {beats}/min
Percent HR: 68 %
Rest HR: 78 {beats}/min
TID: 0.89

## 2019-05-13 MED ORDER — TECHNETIUM TC 99M TETROFOSMIN IV KIT
10.0000 | PACK | Freq: Once | INTRAVENOUS | Status: AC | PRN
  Administered 2019-05-13: 9.773 via INTRAVENOUS

## 2019-05-13 MED ORDER — TECHNETIUM TC 99M TETROFOSMIN IV KIT
30.0000 | PACK | Freq: Once | INTRAVENOUS | Status: AC | PRN
  Administered 2019-05-13: 29.132 via INTRAVENOUS

## 2019-05-13 MED ORDER — REGADENOSON 0.4 MG/5ML IV SOLN
0.4000 mg | Freq: Once | INTRAVENOUS | Status: AC
  Administered 2019-05-13: 0.4 mg via INTRAVENOUS

## 2019-05-14 NOTE — ED Provider Notes (Signed)
[facility]    CSN: [id number] Arrival date & time: 05/02/19  1230      History   Chief Complaint Chief Complaint  Patient presents with  . post nasal drip  . chest congestion    HPI Michelle Waller is a 29 y.o. female.   29 yo female with a c/o chest congestion, mild wheezing, and shortness of breath at night. States has a h/o allergies and asthma and takes medications for these. Denies any fevers, chills, cough, chest pains.      Past Medical History:  Diagnosis Date  . Allergy   . Anxiety   . Asthma    physical induced asthma   . Eating disorder   . Frequent headaches   . Gastritis   . GERD (gastroesophageal reflux disease)   . Hiatal hernia   . IBS (irritable bowel syndrome)   . Kidney stones   . Ovarian cyst   . Sjogren's disease ([place])   . Thyroid disease   . UTI (urinary tract infection)   . Vision abnormalities     Patient Active Problem List   Diagnosis Date Noted  . Seasonal and perennial allergic rhinitis 04/16/2019  . Palpitations 01/15/2019  . Hypokalemia 01/15/2019  . Nonallopathic lesion of lumbosacral region 10/02/2017  . Nonallopathic lesion of sacral region 10/02/2017  . Irritable bowel syndrome 06/21/2017  . Routine general medical examination at a health care facility 06/14/2017  . 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 Nonallopathic lesion of thoracic region 04/30/2017  . Nonallopathic lesion of rib cage 04/30/2017  . Nonallopathic lesion of cervical region 04/30/2017  . ANA positive 10/31/2016  . Anxiety 08/15/2016  . Hypothyroidism 02/03/2016  . GERD (gastroesophageal reflux disease) 02/11/2015  . Kidney stones 02/11/2015    Past Surgical History:  Procedure Laterality Date  . ADENOIDECTOMY    . COLONOSCOPY    . CYSTOSCOPY    . KNEE SURGERY      OB History    Gravida  0   Para  0   Term  0   Preterm  0   AB  0   Living  0     SAB  0   TAB  0   Ectopic  0   Multiple  0   Live Births                 Home Medications    Prior to Admission medications   Medication Sig Start Date End Date Taking? Authorizing Provider  cholecalciferol (VITAMIN D3) 25 MCG (1000 UT) tablet Take 1,000 Units by mouth daily.   Yes [provider]  Cyanocobalamin (VITAMIN B 12 PO) Place 1 tablet under the tongue daily.    Yes [provider]  FLUoxetine (PROZAC) 40 MG capsule  03/04/19  Yes [provider]  Iron, Ferrous Sulfate, 325 (65 Fe) MG TABS  04/10/16  Yes [provider]  LORazepam (ATIVAN) 0.5 MG tablet Take 1 tablet (0.5 mg total) by mouth daily as needed for anxiety. 11/06/17  Yes Hoyt Koch, MD  Prenatal Vit-Fe Fumarate-FA (PRENATAL MULTIVITAMIN) TABS tablet Take 1 tablet by mouth daily at 12 noon.   Yes [provider]  albuterol (PROVENTIL HFA;VENTOLIN HFA) 108 (90 Base) MCG/ACT inhaler Inhale 2 puffs into the lungs every 4 (four) hours as needed for wheezing or shortness of breath. 11/19/17   Lyndal Pulley, DO  azelastine (ASTELIN) 0.1 % nasal spray Place 1 spray into both nostrils 2 (two) times daily.  Use in each nostril as directed 05/02/19   Norval Gable, MD  levothyroxine (SYNTHROID) 50 MCG tablet TAKE 1 TABLET (50 MCG TOTAL) BY MOUTH DAILY BEFORE BREAKFAST. MUST KEEP SCHEDULED APPT FOR AUGUST 6 FOR FUTURE REFILLS Patient taking differently: Take 50 mcg by mouth daily before breakfast.  09/16/18   Hoyt Koch, MD    Family History Family History  Problem Relation Age of Onset  . Depression Mother   . Atrial fibrillation Mother   . Hypertension Father   . Colon polyps Father   . Arthritis Maternal Grandmother   . Heart disease Maternal Grandmother   . Cancer Paternal Grandmother        breast  . Hypertension Paternal Grandmother   . Heart disease Paternal Grandfather   . Hypertension Paternal Grandfather   . Diabetes Paternal Grandfather   . Diabetes Mellitus II Sister   . Hypothyroidism Sister   . Colon cancer Neg  Hx   . Esophageal cancer Neg Hx   . Rectal cancer Neg Hx   . Stomach cancer Neg Hx     Social History Social History   Tobacco Use  . Smoking status: Former Smoker    Quit date: 11/09/2011    Years since quitting: 7.5  . Smokeless tobacco: Never Used  Substance Use Topics  . Alcohol use: Not Currently    Alcohol/week: 0.0 standard drinks    Comment: occasionally   . Drug use: No     Allergies   Soy allergy   Review of Systems Review of Systems   Physical Exam Triage Vital Signs ED Triage Vitals  Enc Vitals Group     BP 05/02/19 1245 (!) 127/95     Pulse Rate 05/02/19 1245 85     Resp 05/02/19 1245 18     Temp 05/02/19 1245 98.7 F (37.1 C)     Temp Source 05/02/19 1245 Oral     SpO2 05/02/19 1245 100 %     Weight 05/02/19 1246 144 lb (65.3 kg)     Height 05/02/19 1246 5\' 2"  (1.575 m)     Head Circumference --      Peak Flow --      Pain Score 05/02/19 1245 5     Pain Loc --      Pain Edu? --      Excl. in [place]? --    No data found.  Updated Vital Signs BP (!) 127/95 (BP Location: Left Arm)   Pulse 85   Temp 98.7 F (37.1 C) (Oral)   Resp 18   Ht 5\' 2"  (1.575 m)   Wt 65.3 kg   LMP 04/11/2019 (Exact Date)   SpO2 100%   BMI 26.34 kg/m   Visual Acuity Right Eye Distance:   Left Eye Distance:   Bilateral Distance:    Right Eye Near:   Left Eye Near:    Bilateral Near:     Physical Exam Vitals and nursing note reviewed.  Constitutional:      General: She is not in acute distress.    Appearance: She is not toxic-appearing or diaphoretic.  HENT:     Right Ear: Tympanic membrane normal.     Left Ear: Tympanic membrane normal.     Mouth/Throat:     Comments: Post nasal drainage Cardiovascular:     Rate and Rhythm: Normal rate.     Heart sounds: Normal heart sounds.  Pulmonary:     Effort: Pulmonary effort is normal. No respiratory distress.  Breath sounds: Normal breath sounds. No stridor. No wheezing, rhonchi or rales.  Neurological:       Mental Status: She is alert.      UC Treatments / Results  Labs (all labs ordered are listed, but only abnormal results are displayed) Labs Reviewed - No data to display  EKG   Radiology NM Myocar Multi W/Spect W/Wall Motion / EF  Result Date: 05/13/2019  Low risk, probably normal pharmacologic myocardial perfusion stress test.  There is a small in size, moderate in severity, fixed mid and basal anterior defect most likely representing artifact but cannot rule out an element of scar.  There is a moderate in size, moderate in severity, fixed inferoseptal defect most likely representing artifact but cannot rule out an element of scar.  There is no evidence of significant ischemia.  The left ventricular ejection fraction is normal (>65%).  There are no significant coronary artery calcifications on the attenuation correction CT.  Incidental note is made of punctate densities at the upper poles of both kidneys, which may represent small renal stones.  Sensitivity and specificity of the study are degraded by extracardiac activity and motion.     Procedures Procedures (including critical care time)  Medications Ordered in UC Medications - No data to display  Initial Impression / Assessment and Plan / UC Course  I have reviewed the triage vital signs and the nursing notes.  Pertinent labs & imaging results that were available during my care of the patient were reviewed by me and considered in my medical decision making (see chart for details).      Final Clinical Impressions(s) / UC Diagnoses   Final diagnoses:  Environmental allergies  Post-nasal drip  Mild intermittent reactive airway disease with acute exacerbation     Discharge Instructions     Continue your other allergy medications    ED Prescriptions    Medication Sig Dispense Auth. Provider   predniSONE (DELTASONE) 20 MG tablet Take 1 tablet (20 mg total) by mouth daily. 7 tablet Kensington Rios, Linward Foster, MD    azelastine (ASTELIN) 0.1 % nasal spray Place 1 spray into both nostrils 2 (two) times daily. Use in each nostril as directed 30 mL Norval Gable, MD      1. diagnosis reviewed with patient 2. rx as per orders above; reviewed possible side effects, interactions, risks and benefits  3. Recommend supportive treatment with continue other allergy medications  4. Follow-up prn if symptoms worsen or don't improve   PDMP not reviewed this encounter.   Norval Gable, MD 05/14/19 [phone]

## 2019-05-21 ENCOUNTER — Ambulatory Visit: Payer: No Typology Code available for payment source | Admitting: Nurse Practitioner

## 2019-05-21 ENCOUNTER — Encounter: Payer: Self-pay | Admitting: Nurse Practitioner

## 2019-05-21 VITALS — BP 100/80 | HR 84 | Temp 98.9°F | Ht 62.4 in | Wt 144.4 lb

## 2019-05-21 DIAGNOSIS — Z01818 Encounter for other preprocedural examination: Secondary | ICD-10-CM | POA: Diagnosis not present

## 2019-05-21 DIAGNOSIS — R079 Chest pain, unspecified: Secondary | ICD-10-CM

## 2019-05-21 NOTE — Patient Instructions (Signed)
If you are age 30 or older, your body mass index should be between 23-30. Your Body mass index is 26.07 kg/m. If this is out of the aforementioned range listed, please consider follow up with your Primary Care Provider.  If you are age 59 or younger, your body mass index should be between 19-25. Your Body mass index is 26.07 kg/m. If this is out of the aformentioned range listed, please consider follow up with your Primary Care Provider.   Increase to Pepcid 20 mg twice daily.  You have been scheduled for an endoscopy. Please follow written instructions given to you at your visit today. If you use inhalers (even only as needed), please bring them with you on the day of your procedure.

## 2019-05-21 NOTE — Progress Notes (Signed)
IMPRESSION and PLAN:    29 year old female with PMH significant for remote bulimia, IBS, anxiety, GERD, hiatal hernia, hypothyroidism  # Recurrent chest pain / new dysphagia --GERD related?  Spasms?  Functional chest pain?  It seems like cardiac etiology was recently excluded.   --Took omeprazole for years but it lost efficacy.  Could not tolerate Protonix due to diarrhea.  Most recently on Pepcid but now having breakthrough chest pain.  --In the last 6 months she has started having episodes of dysphagia.  She was able to dislodge Brussels sprouts earlier today.  Rule out stricture.  Rule out esophageal dysmotility.. Recently diagnosed with Sjogren's syndrome --Additionally in the last several months she has been having excessive phlegm in her throat.  This is possibly secondary to allergies, postnasal drip. She is followed by an Horticulturist, commercial.  --Though it has only been 2 years since her last EGD I do think she needs an evaluation, especially in light of new dysphagia.  I thought about a barium swallow but decided to proceed with an EGD instead. The risks and benefits of EGD were discussed and the patient agrees to proceed.  --In the interim we will increase Pepcid to twice daily --At some point she could be tried on a different PPI other than omeprazole and Protonix --Advised patient to eat small bites, chew well with liquids in between bites to avoid food impaction.     --  HPI:    Primary GI: Dr. Hilarie Fredrickson  Chief complaint : Recurrent chest pain.  Swallowing problems, excessive phlegm in throat  Laporchia has been followed by Dr. Hilarie Fredrickson for noncardiac chest pain, GERD and IBS.  She has not been seen in over a year. She gives a history of bulimia several years ago.  Subsequent to that is when she developed problems with GERD.  She took omeprazole for several years but then it seemed to stop working.  An EGD for evaluation of chest pain January 2019 remarkable for a 2 cm hiatal  hernia and gastritis, see report below. We started her on pantoprazole in 2019. It helped but she had to discontinue it in January 2020 due to it causing painful diarrhea.  Since then she has been on Pepcid 20 mg daily.  She had a colonoscopy 03/19/2018 for evaluation of diarrhea.  The exam was normal as were random biopsies of the colon.   HISTORY SINCE LAST VISIT Did well on daily Prilosec until 5 or 6 months when chest pain returned. The pain occurs throughout the day, no nocturnal symptoms.  Pain worse after meals. Patient says she has esophageal spasm sometimes  She occasionally gets heartburn but not a predominant symptom.   She has been meditating to manage stress.  She reported weight loss associated with the chest pain but looking back in epic it appears her weight is actually stable in the mid 140s.  She has been to the ED a few times since we last saw her  05/02/19 ED for congestion, wheezing and shortness of breath.  Symptoms felt to be secondary to environmental allergies, postnasal drip and mild exacerbation of asthma  02/03/19 ED for palpitations.  She had worn a Holter monitor but results were pending.  EKG negative for dysrhythmias.  She was mildly hypokalemic, labs otherwise unremarkable including normal troponin.  Chest x-ray negative.   01/28/19 ED for chest pain and tachycardia.  EKG, CXR, labs unremarkable.  Potassium was slightly low at 3.4 but  otherwise electrolytes within normal range.  Magnesium normal.  High-sensitivity troponin negative. CXR negative  05/06/19 Cardiology office visit-   Per their note a 3-day cardiac monitor placed early January did not show any significant arrhythmias.  Cardiology was unclear if the chest pain was secondary to reflux versus esophageal spasm or cardiac in nature.  Patient was very concerned how the chest pain was compromising her activities of daily living.  Therefore she was scheduled for an echocardiogram and Myoview.  Results are in epic but  overall seem to be low risk, probably normal pharmacologic myocardial perfusion stress test.  There was a moderate in size and moderate in severity fixed inferior septal defect most likely representing artifact but scar could not be ruled out.  No evidence for significant ischemia.  Normal ejection fraction  In addition to recurrent chest pain patient has had a couple of new symptoms arise since we saw her last.  She has been having episodes of solid food dysphagia.  In fact today Brussels sprouts became lodged and she had to lean over the sink to get them out.  Breads and meat are also problematic.  Second new development since we last saw her is that she  has been waking up in the mornings with a lot of phlegm in throat.  She  has asthma and seasonal allergies and is followed by an Allergist who has her on Zyrtec , Flonase and another nasal spray.  Maetta says she was recently diagnosed with Sjogren's  Data Reviewed:   EGD 02/01/17 for non-cardiac chest pain.  Normal mucosa was found in the entire esophagus. - 2 cm hiatal hernia. - Gastritis. Biopsied. - Normal examined duodenum.  Review of systems:     No chest pain, no SOB, no fevers, no urinary sx   Past Medical History:  Diagnosis Date  . Allergy   . Anxiety   . Asthma    physical induced asthma   . Eating disorder   . Frequent headaches   . Gastritis   . GERD (gastroesophageal reflux disease)   . Hiatal hernia   . IBS (irritable bowel syndrome)   . Kidney stones   . Ovarian cyst   . Sjogren's disease ([place])   . Thyroid disease   . UTI (urinary tract infection)   . Vision abnormalities     Patient's surgical history, family medical history, social history, medications and allergies were all reviewed in Epic   Creatinine clearance cannot be calculated (Patient's most recent lab result is older than the maximum 21 days allowed.)  Current Outpatient Medications  Medication Sig Dispense Refill  . albuterol (PROVENTIL  HFA;VENTOLIN HFA) 108 (90 Base) MCG/ACT inhaler Inhale 2 puffs into the lungs every 4 (four) hours as needed for wheezing or shortness of breath. 1 each 6  . azelastine (ASTELIN) 0.1 % nasal spray Place 1 spray into both nostrils 2 (two) times daily. Use in each nostril as directed 30 mL 0  . cholecalciferol (VITAMIN D3) 25 MCG (1000 UT) tablet Take 1,000 Units by mouth daily.    . Cyanocobalamin (VITAMIN B 12 PO) Place 1 tablet under the tongue daily.     Marland Kitchen FLUoxetine (PROZAC) 40 MG capsule     . Iron, Ferrous Sulfate, 325 (65 Fe) MG TABS     . levothyroxine (SYNTHROID) 50 MCG tablet TAKE 1 TABLET (50 MCG TOTAL) BY MOUTH DAILY BEFORE BREAKFAST. MUST KEEP SCHEDULED APPT FOR AUGUST 6 FOR FUTURE REFILLS (Patient taking differently: Take 50 mcg by  mouth daily before breakfast. ) 90 tablet 3  . LORazepam (ATIVAN) 0.5 MG tablet Take 1 tablet (0.5 mg total) by mouth daily as needed for anxiety. 30 tablet 0  . Prenatal Vit-Fe Fumarate-FA (PRENATAL MULTIVITAMIN) TABS tablet Take 1 tablet by mouth daily at 12 noon.     No current facility-administered medications for this visit.    Physical Exam:     BP 100/80 (BP Location: Left Arm, Patient Position: Sitting, Cuff Size: Normal)   Pulse 84   Temp 98.9 F (37.2 C)   Ht 5' 2.4" (1.585 m) Comment: height measured without shoes  Wt 144 lb 6 oz (65.5 kg)   LMP 05/03/2019 (Exact Date)   BMI 26.07 kg/m   GENERAL:  Pleasant female in NAD PSYCH: : Cooperative, normal affect CARDIAC:  RRR,  PULM: Normal respiratory effort, lungs CTA bilaterally, no wheezing ABDOMEN:  Nondistended, soft, nontender. No obvious masses, no hepatomegaly,  normal bowel sounds SKIN:  turgor, no lesions seen Musculoskeletal:  Normal muscle tone, normal strength NEURO: Alert and oriented x 3, no focal neurologic deficits   Tye Savoy , NP 05/21/2019, 4:28 PM

## 2019-05-27 ENCOUNTER — Other Ambulatory Visit: Payer: Self-pay

## 2019-05-27 ENCOUNTER — Encounter: Payer: Self-pay | Admitting: Allergy & Immunology

## 2019-05-27 ENCOUNTER — Ambulatory Visit: Payer: No Typology Code available for payment source | Admitting: Allergy & Immunology

## 2019-05-27 VITALS — BP 110/78 | HR 90 | Temp 97.9°F | Resp 16 | Ht 62.0 in | Wt 147.0 lb

## 2019-05-27 DIAGNOSIS — J4530 Mild persistent asthma, uncomplicated: Secondary | ICD-10-CM | POA: Diagnosis not present

## 2019-05-27 DIAGNOSIS — J302 Other seasonal allergic rhinitis: Secondary | ICD-10-CM | POA: Diagnosis not present

## 2019-05-27 DIAGNOSIS — K9049 Malabsorption due to intolerance, not elsewhere classified: Secondary | ICD-10-CM | POA: Diagnosis not present

## 2019-05-27 DIAGNOSIS — J3089 Other allergic rhinitis: Secondary | ICD-10-CM

## 2019-05-27 MED ORDER — XHANCE 93 MCG/ACT NA EXHU: 32.0000 | INHALANT_SUSPENSION | Freq: Two times a day (BID) | NASAL | 5 refills | Status: DC

## 2019-05-27 MED ORDER — LEVALBUTEROL TARTRATE 45 MCG/ACT IN AERO: 2.0000 | INHALATION_SPRAY | RESPIRATORY_TRACT | 1 refills | Status: DC | PRN

## 2019-05-27 MED ORDER — EPINEPHRINE 0.3 MG/0.3ML IJ SOAJ: 0.3000 mg | INTRAMUSCULAR | 2 refills | Status: DC | PRN

## 2019-05-27 NOTE — Patient Instructions (Addendum)
1. Seasonal and perennial allergic rhinitis (grasses, weeds, trees, indoor molds, dust mites, cat and dog) - Add on: Xhance two sprays per nostril twice daily - Continue with: Zyrtec (cetirizine) 10mg  tablet once daily and Astelin (azelastine) two sprays per nostril up to twice daily - Strongly consider starting allergy shots for long term control. - Consent provided today. - Call your insurance company about coverage and call us when you make a decision.  2. Intermittent asthma, uncomplicated - Spirometry looks great today. - This could be allergy mediated, so controlling your allergies will help with your asthma. - Stop the albuterol and start Xopenex two sprays per nostril daily (this has less of the elevated heart rate compared to albuterol).  3. Return in about 3 months (around 08/27/2019). This can be an in-person, a virtual Webex or a telephone follow up visit.   Please inform us of any Emergency Department visits, hospitalizations, or changes in symptoms. Call us before going to the ED for breathing or allergy symptoms since we might be able to fit you in for a sick visit. Feel free to contact us anytime with any questions, problems, or concerns.  It was a pleasure to see you again today!  Websites that have reliable patient information: 1. American Academy of Asthma, Allergy, and Immunology: www.aaaai.org 2. Food Allergy Research and Education (FARE): foodallergy.org 3. Mothers of Asthmatics: http://www.asthmacommunitynetwork.org 4. American College of Allergy, Asthma, and Immunology: www.acaai.org   COVID-19 Vaccine Information can be found at: ShippingScam.co.uk For questions related to vaccine distribution or appointments, please email vaccine@ .com or call [phone].     "Like" [place] on Facebook and Instagram for our latest updates!       HAPPY SPRING!  Make sure you are registered to vote! If you have  moved or changed any of your contact information, you will need to get this updated before voting!  In some cases, you MAY be able to register to vote online: [email or web address]

## 2019-05-27 NOTE — Progress Notes (Signed)
FOLLOW UP  Date of Service/Encounter:  05/27/19   Assessment:   Seasonal and perennial allergic rhinitis (grasses, weeds, trees, indoor molds, dust mites, cat and dog)  Food intolerance - with upper GI planned for later this month  Intermittent asthma, uncomplicated - likely allergic triggered  Plan/Recommendations:   1. Seasonal and perennial allergic rhinitis (grasses, weeds, trees, indoor molds, dust mites, cat and dog) - Add on: Xhance two sprays per nostril twice daily - Continue with: Zyrtec (cetirizine) 10mg  tablet once daily and Astelin (azelastine) two sprays per nostril up to twice daily - Strongly consider starting allergy shots for long term control. - Consent provided today. - Call your insurance company about coverage and call us when you make a decision.  2. Intermittent asthma, uncomplicated - Spirometry looks great today. - This could be allergy mediated, so controlling your allergies will help with your asthma. - Stop the albuterol and start Xopenex two sprays per nostril daily (this has less of the elevated heart rate compared to albuterol).  3. Return in about 3 months (around 08/27/2019). This can be an in-person, a virtual Webex or a telephone follow up visit.  Subjective:   Michelle Waller is a 29 y.o. female presenting today for follow up of  Chief Complaint  Patient presents with  . Allergic Rhinitis   . Food Intolerance    soy protein bothers her and soy sauce  . Other    has questions about possible asthma, feels SOB when working out, albuterol makes her heart race, does NOT  feel good when that happens  . Other    tried singulair, had bad dreams    Michelle Waller has a history of the following: Patient Active Problem List   Diagnosis Date Noted  . Seasonal and perennial allergic rhinitis 04/16/2019  . Palpitations 01/15/2019  . Hypokalemia 01/15/2019  . Nonallopathic lesion of lumbosacral region 10/02/2017  . Nonallopathic lesion of sacral  region 10/02/2017  . Irritable bowel syndrome 06/21/2017  . Routine general medical examination at a health care facility 06/14/2017  . 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 Nonallopathic lesion of thoracic region 04/30/2017  . Nonallopathic lesion of rib cage 04/30/2017  . Nonallopathic lesion of cervical region 04/30/2017  . ANA positive 10/31/2016  . Anxiety 08/15/2016  . Hypothyroidism 02/03/2016  . GERD (gastroesophageal reflux disease) 02/11/2015  . Kidney stones 02/11/2015    History obtained from: chart review and patient.  Michelle Waller is a 29 y.o. female presenting for a follow up visit. She was last seen in March 2021 as a new patient. At that time, we continued her cetirizine and started montelukast 10mg  daily. We did testing to the foods and all of the tsting we did was negative.  Since the last visit, she has done well. She is going to have an UGI done later this month.  Asthma/Respiratory Symptom History: She is bringing up a new concern today. She reports that she has intermittent periods of aching in her chest bilaterally. This occurs every 3-4 days. She did feel it more when she was on the Singulair. She got the weird dreams from the [facility] and did not feel any of the benefit. She does have albuterol which only tends to causes tachycardia. She uses her albuterol very rarely, but she cannot tolerate the increase in her heart rate. She has never had prednisone and has never had a breathing test.   Allergic Rhinitis Symptom History: She remains on the cetirizine for her P/SAR. She does  report some mucous production and has a picture of it today. She has a lot of mucous production fairly frequently. She is on a nose spray (fluticasone) and then she was changed to azelastine; she thinks that the azelastine works better. She started the nose spray relatively recent since the last visit. She apparently went to Urgent Care. She never did try doubling the cetirizine at all.   Otherwise,  there have been no changes to her past medical history, surgical history, family history, or social history.    Review of Systems  Constitutional: Negative.  Negative for fever, malaise/fatigue and weight loss.  HENT: Positive for congestion. Negative for ear discharge and ear pain.        Positive for postnasal drip.   Eyes: Negative for pain, discharge and redness.  Respiratory: Negative for cough, sputum production, shortness of breath and wheezing.   Cardiovascular: Negative.  Negative for chest pain and palpitations.  Gastrointestinal: Negative for abdominal pain, constipation, diarrhea, heartburn, nausea and vomiting.  Skin: Negative.  Negative for itching and rash.  Neurological: Negative for dizziness and headaches.  Endo/Heme/Allergies: Negative for environmental allergies. Does not bruise/bleed easily.       Objective:   Blood pressure 110/78, pulse 90, temperature 97.9 F (36.6 C), temperature source Temporal, resp. rate 16, height 5\' 2"  (1.575 m), weight 147 lb (66.7 kg), last menstrual period 05/03/2019, SpO2 98 %. Body mass index is 26.89 kg/m.   Physical Exam:  Physical Exam  Constitutional: She appears well-developed and well-nourished.  Very pleasant and interactive.   HENT:  Head: Normocephalic and atraumatic.  Right Ear: Tympanic membrane, external ear and ear canal normal.  Left Ear: Tympanic membrane, external ear and ear canal normal.  Nose: Mucosal edema and rhinorrhea present. No nasal deformity or septal deviation. No epistaxis. Right sinus exhibits no maxillary sinus tenderness and no frontal sinus tenderness. Left sinus exhibits no maxillary sinus tenderness and no frontal sinus tenderness.  Mouth/Throat: Uvula is midline and oropharynx is clear and moist. Mucous membranes are not pale and not dry.  Turbinates edematous with clear mucous. No epistaxis noted.   Eyes: Pupils are equal, round, and reactive to light. Conjunctivae and EOM are normal. Right  eye exhibits no chemosis and no discharge. Left eye exhibits no chemosis and no discharge. Right conjunctiva is not injected. Left conjunctiva is not injected.  Cardiovascular: Normal rate, regular rhythm and normal heart sounds.  Respiratory: Effort normal and breath sounds normal. No accessory muscle usage. No tachypnea. No respiratory distress. She has no wheezes. She has no rhonchi. She has no rales. She exhibits no tenderness.  Moving air well in all lung fields. No increased work of breathing noted.   Lymphadenopathy:    She has no cervical adenopathy.  Neurological: She is alert.  Skin: No abrasion, no petechiae and no rash noted. Rash is not papular, not vesicular and not urticarial. No erythema. No pallor.  Psychiatric: She has a normal mood and affect.     Diagnostic studies:    Spirometry: results normal (FEV1: 2.74/92%, FVC: 3.77/107%, FEV1/FVC: 73%).    Spirometry consistent with normal pattern.   Allergy Studies: none       Salvatore Marvel, MD  [facility] [place]

## 2019-05-28 DIAGNOSIS — J3081 Allergic rhinitis due to animal (cat) (dog) hair and dander: Secondary | ICD-10-CM

## 2019-05-28 NOTE — Progress Notes (Signed)
VIALS EXP 05-27-20

## 2019-05-29 DIAGNOSIS — J3089 Other allergic rhinitis: Secondary | ICD-10-CM

## 2019-06-02 ENCOUNTER — Telehealth: Payer: Self-pay

## 2019-06-02 NOTE — Telephone Encounter (Signed)
Prior authorization for Michelle Waller has been submitted on covermymeds. Currently pending approval/denial.

## 2019-06-04 NOTE — Progress Notes (Signed)
Addendum: Reviewed and agree with assessment and management plan. If EGD unrevealing then esophageal mano is recommended completely in light of Sjogren's diagnosis Aja Bolander, Lajuan Lines, MD

## 2019-06-05 NOTE — Telephone Encounter (Signed)
Approved and sent to pharmacy. I have also sent the approval notification to the scan center.

## 2019-06-11 ENCOUNTER — Other Ambulatory Visit: Payer: Self-pay

## 2019-06-11 ENCOUNTER — Ambulatory Visit (INDEPENDENT_AMBULATORY_CARE_PROVIDER_SITE_OTHER): Payer: No Typology Code available for payment source

## 2019-06-11 DIAGNOSIS — R072 Precordial pain: Secondary | ICD-10-CM

## 2019-06-11 DIAGNOSIS — R079 Chest pain, unspecified: Secondary | ICD-10-CM

## 2019-06-17 ENCOUNTER — Ambulatory Visit (INDEPENDENT_AMBULATORY_CARE_PROVIDER_SITE_OTHER): Payer: No Typology Code available for payment source

## 2019-06-17 ENCOUNTER — Other Ambulatory Visit: Payer: Self-pay

## 2019-06-17 DIAGNOSIS — J3089 Other allergic rhinitis: Secondary | ICD-10-CM

## 2019-06-17 DIAGNOSIS — J302 Other seasonal allergic rhinitis: Secondary | ICD-10-CM | POA: Diagnosis not present

## 2019-06-17 NOTE — Progress Notes (Signed)
Immunotherapy   Patient Details  Name: Michelle Waller MRN: [id number] Date of Birth: 02-15-1990  06/17/2019  Ree Kida started injections for  Blue vials for Mold-Dm-CR and G-W-TC-D. Patient tolerated injections well and waited 30 mins in the exam room.  Following schedule: B  Frequency:2 times per week Epi-Pen:Epi-Pen Available  Consent signed and patient instructions given.   Festus Holts Shamar Kracke 06/17/2019, 3:25 PM

## 2019-06-18 ENCOUNTER — Ambulatory Visit (INDEPENDENT_AMBULATORY_CARE_PROVIDER_SITE_OTHER): Payer: No Typology Code available for payment source

## 2019-06-18 ENCOUNTER — Other Ambulatory Visit: Payer: Self-pay | Admitting: Internal Medicine

## 2019-06-18 DIAGNOSIS — Z1159 Encounter for screening for other viral diseases: Secondary | ICD-10-CM

## 2019-06-19 ENCOUNTER — Telehealth: Payer: Self-pay | Admitting: *Deleted

## 2019-06-19 ENCOUNTER — Ambulatory Visit (AMBULATORY_SURGERY_CENTER): Payer: No Typology Code available for payment source | Admitting: Internal Medicine

## 2019-06-19 ENCOUNTER — Other Ambulatory Visit: Payer: Self-pay

## 2019-06-19 ENCOUNTER — Encounter: Payer: Self-pay | Admitting: Internal Medicine

## 2019-06-19 VITALS — BP 100/52 | HR 70 | Temp 97.3°F | Resp 16 | Ht 62.4 in | Wt 144.0 lb

## 2019-06-19 DIAGNOSIS — K228 Other specified diseases of esophagus: Secondary | ICD-10-CM

## 2019-06-19 DIAGNOSIS — R1319 Other dysphagia: Secondary | ICD-10-CM

## 2019-06-19 DIAGNOSIS — Z01818 Encounter for other preprocedural examination: Secondary | ICD-10-CM

## 2019-06-19 DIAGNOSIS — K449 Diaphragmatic hernia without obstruction or gangrene: Secondary | ICD-10-CM

## 2019-06-19 DIAGNOSIS — R079 Chest pain, unspecified: Secondary | ICD-10-CM | POA: Diagnosis not present

## 2019-06-19 DIAGNOSIS — K219 Gastro-esophageal reflux disease without esophagitis: Secondary | ICD-10-CM

## 2019-06-19 DIAGNOSIS — R1310 Dysphagia, unspecified: Secondary | ICD-10-CM

## 2019-06-19 LAB — SARS CORONAVIRUS 2 (TAT 6-24 HRS): SARS Coronavirus 2: NEGATIVE

## 2019-06-19 MED ORDER — SODIUM CHLORIDE 0.9 % IV SOLN: 500.0000 mL | Freq: Once | INTRAVENOUS | Status: DC

## 2019-06-19 NOTE — Progress Notes (Signed)
Called to room to assist during endoscopic procedure.  Patient ID and intended procedure confirmed with present staff. Received instructions for my participation in the procedure from the performing physician.  

## 2019-06-19 NOTE — Progress Notes (Signed)
Report to PACU, RN, vss, BBS= Clear.  

## 2019-06-19 NOTE — Op Note (Signed)
[facility] Patient Name: Michelle Waller Procedure Date: 06/19/2019 9:33 AM MRN: [id number] Endoscopist: Jerene Bears , MD Age: 29 Referring MD:  Date of Birth: 03-26-1990 Gender: Female Account #: [id number] Procedure:                Upper GI endoscopy Indications:              Dysphagia, Gastro-esophageal reflux disease,                            atypical chest pain Medicines:                Monitored Anesthesia Care Procedure:                Pre-Anesthesia Assessment:                           - Prior to the procedure, a History and Physical                            was performed, and patient medications and                            allergies were reviewed. The patient's tolerance of                            previous anesthesia was also reviewed. The risks                            and benefits of the procedure and the sedation                            options and risks were discussed with the patient.                            All questions were answered, and informed consent                            was obtained. Prior Anticoagulants: The patient has                            taken no previous anticoagulant or antiplatelet                            agents. ASA Grade Assessment: II - A patient with                            mild systemic disease. After reviewing the risks                            and benefits, the patient was deemed in                            satisfactory condition to undergo the procedure.  After obtaining informed consent, the endoscope was                            passed under direct vision. Throughout the                            procedure, the patient's blood pressure, pulse, and                            oxygen saturations were monitored continuously. The                            Endoscope was introduced through the mouth, and                            advanced to the second part of duodenum. The  upper                            GI endoscopy was accomplished without difficulty.                            The patient tolerated the procedure well. Scope In: Scope Out: Findings:                 Scattered mild mucosal changes characterized by                            white specks were found in the entire esophagus.                            Biopsies were obtained from the proximal and distal                            esophagus with cold forceps to exclude eosinophilic                            esophagitis.                           A 2 cm hiatal hernia was present.                           No endoscopic abnormality was evident in the                            esophagus to explain the patient's complaint of                            dysphagia. It was decided, however, to proceed with                            dilation of the entire esophagus. A guidewire was  placed and the scope was withdrawn. Dilation was                            performed with a Savary dilator with mild                            resistance at 17 mm. The dilation site was examined                            and showed minimal mucosal disruption.                           The gastroesophageal flap valve was visualized                            endoscopically and classified as Hill Grade III                            (minimal fold, loose to endoscope, hiatal hernia                            likely).                           The entire examined stomach was normal.                           The examined duodenum was normal. Complications:            No immediate complications. Estimated Blood Loss:     Estimated blood loss was minimal. Impression:               - White specked mucosa in the esophagus. Biopsied.                           - 2 cm hiatal hernia.                           - No endoscopic esophageal abnormality such as                            stricture or ring to  explain patient's dysphagia.                            Esophagus dilated with 17 mm Savary over a                            guidewire.                           - Normal stomach.                           - Normal examined duodenum. Recommendation:           - Patient has a contact number available for  emergencies. The signs and symptoms of potential                            delayed complications were discussed with the                            patient. Return to normal activities tomorrow.                            Written discharge instructions were provided to the                            patient.                           - Resume previous diet.                           - Continue present medications.                           - Await pathology results.                           - If no response to dilation and esophageal                            biopsies are unrevealing, high resolution                            esophageal manometry is recommended. Jerene Bears, MD 06/19/2019 9:58:00 AM This report has been signed electronically.

## 2019-06-19 NOTE — Patient Instructions (Signed)
YOU HAD AN ENDOSCOPIC PROCEDURE TODAY AT [facility]:   Refer to the procedure report that was given to you for any specific questions about what was found during the examination.  If the procedure report does not answer your questions, please call your gastroenterologist to clarify.  If you requested that your care partner not be given the details of your procedure findings, then the procedure report has been included in a sealed envelope for you to review at your convenience later.  YOU SHOULD EXPECT: Some feelings of bloating in the abdomen. Passage of more gas than usual.  Walking can help get rid of the air that was put into your GI tract during the procedure and reduce the bloating. If you had a lower endoscopy (such as a colonoscopy or flexible sigmoidoscopy) you may notice spotting of blood in your stool or on the toilet paper. If you underwent a bowel prep for your procedure, you may not have a normal bowel movement for a few days.  Please Note:  You might notice some irritation and congestion in your nose or some drainage.  This is from the oxygen used during your procedure.  There is no need for concern and it should clear up in a day or so.  SYMPTOMS TO REPORT IMMEDIATELY:    Following upper endoscopy (EGD)  Vomiting of blood or coffee ground material  New chest pain or pain under the shoulder blades  Painful or persistently difficult swallowing  New shortness of breath  Fever of 100F or higher  Black, tarry-looking stools  For urgent or emergent issues, a gastroenterologist can be reached at any hour by calling [phone]. Do not use MyChart messaging for urgent concerns.    DIET:  We do recommend a small meal at first, but then you may proceed to your regular diet.  Drink plenty of fluids but you should avoid alcoholic beverages for 24 hours.  MEDICATIONS: Continue present medications.  Please see handouts given to you by your recovery nurse.  FOLLOW  UP: If you have no response (improvement) to dilation and the esophageal biopsies are unrevealing, a high resolution esophageal manometry is recommended.  ACTIVITY:  You should plan to take it easy for the rest of today and you should NOT DRIVE or use heavy machinery until tomorrow (because of the sedation medicines used during the test).    FOLLOW UP: Our staff will call the number listed on your records 48-72 hours following your procedure to check on you and address any questions or concerns that you may have regarding the information given to you following your procedure. If we do not reach you, we will leave a message.  We will attempt to reach you two times.  During this call, we will ask if you have developed any symptoms of COVID 19. If you develop any symptoms (ie: fever, flu-like symptoms, shortness of breath, cough etc.) before then, please call [phone].  If you test positive for Covid 19 in the 2 weeks post procedure, please call and report this information to [place].    If any biopsies were taken you will be contacted by phone or by letter within the next 1-3 weeks.  Please call us at [phone] if you have not heard about the biopsies in 3 weeks.   Thank you for allowing [place] to provide for your healthcare needs today.   SIGNATURES/CONFIDENTIALITY: You and/or your care partner have signed paperwork which will be entered into your electronic medical  record.  These signatures attest to the fact that that the information above on your After Visit Summary has been reviewed and is understood.  Full responsibility of the confidentiality of this discharge information lies with you and/or your care-partner. 

## 2019-06-19 NOTE — Progress Notes (Signed)
VS taken by CW  Pt's states no medical or surgical changes since previsit or office visit.

## 2019-06-19 NOTE — Telephone Encounter (Signed)
Received a fax for FMLA paperwork from [employer] for Dr. Ernst Bowler. Called patient and asked to call back for clarification on what the FMLA paperwork is for.

## 2019-06-20 ENCOUNTER — Telehealth: Payer: Self-pay | Admitting: *Deleted

## 2019-06-20 ENCOUNTER — Ambulatory Visit (INDEPENDENT_AMBULATORY_CARE_PROVIDER_SITE_OTHER): Payer: No Typology Code available for payment source | Admitting: Cardiology

## 2019-06-20 ENCOUNTER — Telehealth: Payer: Self-pay | Admitting: Allergy & Immunology

## 2019-06-20 ENCOUNTER — Telehealth: Payer: Self-pay | Admitting: Internal Medicine

## 2019-06-20 ENCOUNTER — Encounter: Payer: Self-pay | Admitting: Cardiology

## 2019-06-20 VITALS — BP 110/68 | HR 70 | Ht 62.0 in | Wt 144.5 lb

## 2019-06-20 DIAGNOSIS — R079 Chest pain, unspecified: Secondary | ICD-10-CM | POA: Diagnosis not present

## 2019-06-20 MED ORDER — OMEPRAZOLE 40 MG PO CPDR: 40.0000 mg | DELAYED_RELEASE_CAPSULE | Freq: Every day | ORAL | 0 refills | Status: DC

## 2019-06-20 MED ORDER — SUCRALFATE 1 G PO TABS
1.0000 g | ORAL_TABLET | Freq: Three times a day (TID) | ORAL | 0 refills | Status: DC
Start: 2019-06-20 — End: 2019-07-05

## 2019-06-20 NOTE — Telephone Encounter (Signed)
Patient was returning your call and paper work was for allergy shots. [phone]

## 2019-06-20 NOTE — Telephone Encounter (Signed)
Called patient.  She has mild discomfort when swallowing and also atypical chest pain, heartburn. Is taking famotidine with no improvement. We will send prescription for omeprazole 40 mg daily, take it in the morning 30 minutes before breakfast.  Use famotidine 20 to 40 mg at bedtime as needed Carafate 1 g tablet before meals and at bedtime.  Advised patient to dissolve the tablet completely and drink it as a slurry. Call with any worsening symptoms.

## 2019-06-20 NOTE — Telephone Encounter (Signed)
Pt had an EGD yesterday and reported soreness today.

## 2019-06-20 NOTE — Telephone Encounter (Signed)
Will work on [employer] forms for the patient and have Dr. Ernst Bowler review them next week. Called patient and advised. Patient verbalized understanding.

## 2019-06-20 NOTE — Telephone Encounter (Signed)
Called patient back regarding soreness after her EGD with Dr. Hilarie Fredrickson yesterday. Left message for patient to call back.

## 2019-06-20 NOTE — Telephone Encounter (Signed)
-----   Message from Rande Brunt Monday, RN sent at 06/20/2019  1:51 PM EDT -----  HI Dr Silverio Decamp,  This patient of Dr. Vena Rua called today with some soreness in her throat post EGD/Dilation 5/10. She did notice this last night after she ate a soft wrap for dinner and says that it has gotten worse. She is able to drink liquids but no solids attempted. Also her atypical chest discomfort she known prior to EGD has returned today 5/10. Please advise.

## 2019-06-20 NOTE — Progress Notes (Signed)
Cardiology Office Note:    Date:  06/20/2019   ID:  Michelle Waller, DOB 1990-09-25, MRN [id number]  PCP:  Hoyt Koch, MD  Cardiologist:  Kate Sable, MD  Electrophysiologist:  None   Referring MD: Hoyt Koch, *   Chief Complaint  Patient presents with  . OTHER    F/u echo/lexi no complaints today. Meds reviewed verbally with pt.    History of Present Illness:    Michelle Waller is a 29 y.o. female with a hx of anxiety, GERD, hiatal hernia who presents for follow-up.  She was last seen due to palpitations and chest pain for about 5 months.  Echocardiogram and Lexiscan Myoview was performed to evaluate patient's symptoms.  Patient now presents for results.  She has no new concerns at this time.  She eventually saw gastroenterology and has some esophageal/reflux symptoms.  Had an EGD and was started on Carafate.  She feels well otherwise.  Past Medical History:  Diagnosis Date  . Allergy   . Anxiety   . Asthma    physical induced asthma   . Eating disorder   . Frequent headaches   . Gastritis   . GERD (gastroesophageal reflux disease)   . Hiatal hernia   . IBS (irritable bowel syndrome)   . Kidney stones   . Ovarian cyst   . Sjogren's disease ([place])   . Thyroid disease   . UTI (urinary tract infection)   . Vision abnormalities     Past Surgical History:  Procedure Laterality Date  . ADENOIDECTOMY    . COLONOSCOPY    . CYSTOSCOPY    . KNEE SURGERY      Current Medications: Current Meds  Medication Sig  . albuterol (PROVENTIL HFA;VENTOLIN HFA) 108 (90 Base) MCG/ACT inhaler Inhale 2 puffs into the lungs every 4 (four) hours as needed for wheezing or shortness of breath.  Marland Kitchen azelastine (ASTELIN) 0.1 % nasal spray Place 1 spray into both nostrils 2 (two) times daily. Use in each nostril as directed  . cholecalciferol (VITAMIN D3) 25 MCG (1000 UT) tablet Take 1,000 Units by mouth daily.  . Cyanocobalamin (VITAMIN B 12 PO) Place 1 tablet under the  tongue daily.   Marland Kitchen EPINEPHrine 0.3 mg/0.3 mL IJ SOAJ injection Inject 0.3 mLs (0.3 mg total) into the muscle as needed for anaphylaxis.  Marland Kitchen FLUoxetine HCl (PROZAC PO) Take 60 mg by mouth daily.  . Fluticasone Propionate (XHANCE) 93 MCG/ACT EXHU Place 32 sprays into the nose in the morning and at bedtime.  . Iron, Ferrous Sulfate, 325 (65 Fe) MG TABS   . levothyroxine (SYNTHROID) 50 MCG tablet TAKE 1 TABLET (50 MCG TOTAL) BY MOUTH DAILY BEFORE BREAKFAST. MUST KEEP SCHEDULED APPT FOR AUGUST 6 FOR FUTURE REFILLS (Patient taking differently: Take 50 mcg by mouth daily before breakfast. )  . LORazepam (ATIVAN) 0.5 MG tablet Take 1 tablet (0.5 mg total) by mouth daily as needed for anxiety.  Marland Kitchen omeprazole (PRILOSEC) 40 MG capsule Take 1 capsule (40 mg total) by mouth daily.  . Prenatal Vit-Fe Fumarate-FA (PRENATAL MULTIVITAMIN) TABS tablet Take 1 tablet by mouth daily at 12 noon.  . sucralfate (CARAFATE) 1 g tablet Take 1 tablet (1 g total) by mouth 4 (four) times daily -  with meals and at bedtime.   Current Facility-Administered Medications for the 06/20/19 encounter (Office Visit) with Kate Sable, MD  Medication  . 0.9 %  sodium chloride infusion     Allergies:   Soy allergy and Montelukast  sodium   Social History   Socioeconomic History  . Marital status: Married    Spouse name: Not on file  . Number of children: Not on file  . Years of education: Not on file  . Highest education level: Not on file  Occupational History  . Not on file  Tobacco Use  . Smoking status: Former Smoker    Quit date: 11/09/2011    Years since quitting: 7.6  . Smokeless tobacco: Never Used  Substance and Sexual Activity  . Alcohol use: Not Currently    Alcohol/week: 0.0 standard drinks    Comment: occasionally   . Drug use: No  . Sexual activity: Not on file  Other Topics Concern  . Not on file  Social History Narrative  . Not on file   Social Determinants of Health   Financial Resource  Strain:   . Difficulty of Paying Living Expenses:   Food Insecurity:   . Worried About Charity fundraiser in the Last Year:   . Arboriculturist in the Last Year:   Transportation Needs:   . Film/video editor (Medical):   Marland Kitchen Lack of Transportation (Non-Medical):   Physical Activity:   . Days of Exercise per Week:   . Minutes of Exercise per Session:   Stress:   . Feeling of Stress :   Social Connections:   . Frequency of Communication with Friends and Family:   . Frequency of Social Gatherings with Friends and Family:   . Attends Religious Services:   . Active Member of Clubs or Organizations:   . Attends Archivist Meetings:   Marland Kitchen Marital Status:      Family History: The patient's family history includes Arthritis in her maternal grandmother; Atrial fibrillation in her mother; Cancer in her paternal grandmother; Colon polyps in her father; Depression in her mother; Diabetes in her paternal grandfather; Diabetes Mellitus II in her sister; Heart disease in her maternal grandmother and paternal grandfather; Hypertension in her father, paternal grandfather, and paternal grandmother; Hypothyroidism in her sister. There is no history of Colon cancer, Esophageal cancer, Rectal cancer, or Stomach cancer.  ROS:   Please see the history of present illness.     All other systems reviewed and are negative.  EKGs/Labs/Other Studies Reviewed:    The following studies were reviewed today:   EKG:  EKG is  ordered today.  The ekg ordered today demonstrates sinus rhythm, normal ECG.   Recent Labs: 09/24/2018: ALT 18 01/28/2019: Magnesium 2.2 02/03/2019: BUN 14; Creatinine, Ser 0.71; Hemoglobin 13.1; Platelets 213; Potassium 3.4; Sodium 140; TSH 3.693  Recent Lipid Panel    Component Value Date/Time   CHOL 180 08/29/2018 1316   TRIG 144.0 08/29/2018 1316   HDL 63.70 08/29/2018 1316   CHOLHDL 3 08/29/2018 1316   VLDL 28.8 08/29/2018 1316   LDLCALC 88 08/29/2018 1316    Physical  Exam:    VS:  BP 110/68 (BP Location: Left Arm, Patient Position: Sitting, Cuff Size: Normal)   Pulse 70   Ht 5\' 2"  (1.575 m)   Wt 144 lb 8 oz (65.5 kg)   SpO2 99%   BMI 26.43 kg/m     Wt Readings from Last 3 Encounters:  06/20/19 144 lb 8 oz (65.5 kg)  06/19/19 144 lb (65.3 kg)  05/27/19 147 lb (66.7 kg)     GEN:  Well nourished, well developed in no acute distress HEENT: Normal NECK: No JVD; No carotid bruits LYMPHATICS: No  lymphadenopathy CARDIAC: RRR, no murmurs, rubs, gallops RESPIRATORY:  Clear to auscultation without rales, wheezing or rhonchi  ABDOMEN: Soft, non-tender, non-distended MUSCULOSKELETAL:  No edema; No deformity  SKIN: Warm and dry NEUROLOGIC:  Alert and oriented x 3 PSYCHIATRIC:  Normal affect   ASSESSMENT:    1. Chest pain of uncertain etiology    PLAN:    In order of problems listed above:  1. Patient with atypical chest pain symptoms.  Echocardiogram showed normal systolic and diastolic function, EF 60 to 65%.  Lexiscan Myoview with no evidence for ischemia.  Low risk scan.  Patient reassured.  Patient encouraged to exercise to improve her cardiovascular health.  Follow-up with GI regarding GERD.  Continue Carafate as recommended by GI.  Follow up as needed.  Total encounter time 35 minutes  Greater than 50% was spent in counseling and coordination of care with the patient Time spent explaining the patient's finding on echocardiogram, findings of stress test.  This note was generated in part or whole with voice recognition software. Voice recognition is usually quite accurate but there are transcription errors that can and very often do occur. I apologize for any typographical errors that were not detected and corrected.  Medication Adjustments/Labs and Tests Ordered: Current medicines are reviewed at length with the patient today.  Concerns regarding medicines are outlined above.  Orders Placed This Encounter  Procedures  . EKG 12-Lead   No  orders of the defined types were placed in this encounter.   Patient Instructions  Medication Instructions:  No changes  *If you need a refill on your cardiac medications before your next appointment, please call your pharmacy*   Lab Work: None  If you have labs (blood work) drawn today and your tests are completely normal, you will receive your results only by: Marland Kitchen MyChart Message (if you have MyChart) OR . A paper copy in the mail If you have any lab test that is abnormal or we need to change your treatment, we will call you to review the results.   Testing/Procedures: None   Follow-Up: At [facility], you and your health needs are our priority.  As part of our continuing mission to provide you with exceptional heart care, we have created designated Provider Care Teams.  These Care Teams include your primary Cardiologist (physician) and Advanced Practice Providers (APPs -  Physician Assistants and Nurse Practitioners) who all work together to provide you with the care you need, when you need it.  We recommend signing up for the patient portal called "MyChart".  Sign up information is provided on this After Visit Summary.  MyChart is used to connect with patients for Virtual Visits (Telemedicine).  Patients are able to view lab/test results, encounter notes, upcoming appointments, etc.  Non-urgent messages can be sent to your provider as well.   To learn more about what you can do with MyChart, go to [email or web address].    Your next appointment:   Follow up as needed    Signed, Kate Sable, MD  06/20/2019 5:15 PM    [place]

## 2019-06-20 NOTE — Patient Instructions (Signed)

## 2019-06-24 ENCOUNTER — Telehealth: Payer: Self-pay | Admitting: *Deleted

## 2019-06-24 NOTE — Telephone Encounter (Signed)
Paperwork has been placed in Dr. Gillermina Hu mailbox to be filled out and completed by him.

## 2019-06-24 NOTE — Telephone Encounter (Signed)
Spoke with patient, patient states that she is feeling much better, just has gas. Pt denies any trouble swallowing or pain. Pt has no other concerns at this time. Pt advised to call with any other questions or concerns.

## 2019-06-24 NOTE — Telephone Encounter (Signed)
  Follow up Call-  Call back number 06/19/2019 03/19/2018 02/01/2017  Post procedure Call Back phone  # [phone]  Permission to leave phone message Yes Yes Yes  Some recent data might be hidden     Patient questions:  Do you have a fever, pain , or abdominal swelling? No. Pain Score  0 *  Have you tolerated food without any problems? Yes.    Have you been able to return to your normal activities? Yes.    Do you have any questions about your discharge instructions: Diet   No. Medications  No. Follow up visit  No.  Do you have questions or concerns about your Care? No.  Actions: * If pain score is 4 or above: No action needed, pain <4.  1. Have you developed a fever since your procedure? no  2.   Have you had an respiratory symptoms (SOB or cough) since your procedure? no  3.   Have you tested positive for COVID 19 since your procedure no  4.   Have you had any family members/close contacts diagnosed with the COVID 19 since your procedure? no   If yes to any of these questions please route to Joylene John, RN and Erenest Rasher, RN

## 2019-06-24 NOTE — Telephone Encounter (Signed)
Patient called stating she is unable to get her allergy injections until her FMLA paperwork is filled out.  Please Advise.

## 2019-06-24 NOTE — Telephone Encounter (Signed)
Agree with recommendations by Dr. Silverio Decamp I am waiting on pathology results which should result soon and then will be in touch. Please check on patient to ensure symptoms are improving

## 2019-06-24 NOTE — Telephone Encounter (Signed)
Paperwork filled out, signed, and faxed back.

## 2019-06-24 NOTE — Telephone Encounter (Signed)
  Follow up Call-  Call back number 06/19/2019 03/19/2018 02/01/2017  Post procedure Call Back phone  # [phone]  Permission to leave phone message Yes Yes Yes  Some recent data might be hidden     Patient questions:  Message left to call us if necessary.

## 2019-06-25 NOTE — Telephone Encounter (Signed)
Thanks for doing that, [place]!   Salvatore Marvel, MD [facility] [place]

## 2019-06-26 ENCOUNTER — Ambulatory Visit (INDEPENDENT_AMBULATORY_CARE_PROVIDER_SITE_OTHER): Payer: No Typology Code available for payment source

## 2019-06-26 DIAGNOSIS — J3089 Other allergic rhinitis: Secondary | ICD-10-CM | POA: Diagnosis not present

## 2019-06-26 DIAGNOSIS — J302 Other seasonal allergic rhinitis: Secondary | ICD-10-CM

## 2019-06-29 ENCOUNTER — Encounter: Payer: Self-pay | Admitting: Internal Medicine

## 2019-06-30 ENCOUNTER — Encounter: Payer: Self-pay | Admitting: Internal Medicine

## 2019-06-30 ENCOUNTER — Encounter: Payer: Self-pay | Admitting: Allergy & Immunology

## 2019-07-05 ENCOUNTER — Encounter: Payer: Self-pay | Admitting: Emergency Medicine

## 2019-07-05 ENCOUNTER — Ambulatory Visit: Payer: Self-pay

## 2019-07-05 ENCOUNTER — Other Ambulatory Visit: Payer: Self-pay

## 2019-07-05 ENCOUNTER — Ambulatory Visit
Admission: EM | Admit: 2019-07-05 | Discharge: 2019-07-05 | Disposition: A | Discharge status: Home / Self Care | Payer: No Typology Code available for payment source | Attending: Emergency Medicine | Admitting: Emergency Medicine

## 2019-07-05 DIAGNOSIS — O26891 Other specified pregnancy related conditions, first trimester: Secondary | ICD-10-CM | POA: Diagnosis present

## 2019-07-05 DIAGNOSIS — R102 Pelvic and perineal pain: Secondary | ICD-10-CM

## 2019-07-05 DIAGNOSIS — N949 Unspecified condition associated with female genital organs and menstrual cycle: Secondary | ICD-10-CM

## 2019-07-05 DIAGNOSIS — R350 Frequency of micturition: Secondary | ICD-10-CM

## 2019-07-05 LAB — URINALYSIS, COMPLETE (UACMP) WITH MICROSCOPIC
Bilirubin Urine: NEGATIVE
Glucose, UA: NEGATIVE mg/dL
Ketones, ur: NEGATIVE mg/dL
Leukocytes,Ua: NEGATIVE
Nitrite: NEGATIVE
Protein, ur: NEGATIVE mg/dL
Specific Gravity, Urine: 1.02 (ref 1.005–1.030)
pH: 7 (ref 5.0–8.0)

## 2019-07-05 NOTE — Discharge Instructions (Addendum)
Recommend continue to monitor symptoms. It does not appear that you have a urinary tract infection but we sent a urine culture to confirm. Continue to increase fluid intake. If pain worsens, call your OB or go to the ER ASAP. Follow-up pending urine culture results.

## 2019-07-05 NOTE — ED Triage Notes (Signed)
Pt c/o pelvic pressure. Started about 5 days ago. She states she is [redacted] weeks pregnant and isn't sure that is the pain she is having or if she has a UTI. She also has lower back pain, frequency. Denies vaginal discharge, fever or dysuria.

## 2019-07-05 NOTE — ED Provider Notes (Signed)
[facility]    CSN: [id number] Arrival date & time: 07/05/19  1210      History   Chief Complaint Chief Complaint  Patient presents with  . Pelvic Pain    HPI Michelle Waller is a 29 y.o. female.   29 year old female presents with lower pelvic pressure and increased urinary frequency for the past 5 days. No distinct pain and denies any fever, dysuria, hematuria, or unusual vaginal discharge or bleeding. She is currently about [redacted] weeks pregnant with her first child and has her first OB appointment in July. She is uncertain if the pressure is just due to pregnancy or if she is starting to have a UTI and requests evaluation. Other chronic health issues include thyroid disorder, GERD, environmental allergies, irritable bowel syndrome and anxiety and currently on Synthroid, Prilosec, Prozac, Zyrtec, Prenatal vitamins and Vit D and B12 daily.   The history is provided by the patient.    Past Medical History:  Diagnosis Date  . Allergy   . Anxiety   . Asthma    physical induced asthma   . Eating disorder   . Frequent headaches   . Gastritis   . GERD (gastroesophageal reflux disease)   . Hiatal hernia   . IBS (irritable bowel syndrome)   . Kidney stones   . Ovarian cyst   . Sjogren's disease ([place])   . Thyroid disease   . UTI (urinary tract infection)   . Vision abnormalities     Patient Active Problem List   Diagnosis Date Noted  . Seasonal and perennial allergic rhinitis 04/16/2019  . Palpitations 01/15/2019  . Hypokalemia 01/15/2019  . Nonallopathic lesion of lumbosacral region 10/02/2017  . Nonallopathic lesion of sacral region 10/02/2017  . Irritable bowel syndrome 06/21/2017  . Routine general medical examination at a health care facility 06/14/2017  . 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 Nonallopathic lesion of thoracic region 04/30/2017  . Nonallopathic lesion of rib cage 04/30/2017  . Nonallopathic lesion of cervical region 04/30/2017  . ANA positive  10/31/2016  . Anxiety 08/15/2016  . Hypothyroidism 02/03/2016  . GERD (gastroesophageal reflux disease) 02/11/2015  . Kidney stones 02/11/2015    Past Surgical History:  Procedure Laterality Date  . ADENOIDECTOMY    . COLONOSCOPY    . CYSTOSCOPY    . KNEE SURGERY      OB History    Gravida  1   Para  0   Term  0   Preterm  0   AB  0   Living  0     SAB  0   TAB  0   Ectopic  0   Multiple  0   Live Births               Home Medications    Prior to Admission medications   Medication Sig Start Date End Date Taking? Authorizing Provider  cetirizine (ZYRTEC) 10 MG tablet Take 10 mg by mouth daily.   Yes [provider]  cholecalciferol (VITAMIN D3) 25 MCG (1000 UT) tablet Take 1,000 Units by mouth daily.   Yes [provider]  Cyanocobalamin (VITAMIN B 12 PO) Place 1 tablet under the tongue daily.    Yes [provider]  FLUoxetine HCl (PROZAC PO) Take 60 mg by mouth daily.   Yes [provider]  levothyroxine (SYNTHROID) 50 MCG tablet TAKE 1 TABLET (50 MCG TOTAL) BY MOUTH DAILY BEFORE BREAKFAST. MUST KEEP SCHEDULED APPT FOR AUGUST 6  FOR FUTURE REFILLS Patient taking differently: Take 50 mcg by mouth daily before breakfast.  09/16/18  Yes Hoyt Koch, MD  omeprazole (PRILOSEC) 40 MG capsule Take 1 capsule (40 mg total) by mouth daily. 06/20/19  Yes Nandigam, Venia Minks, MD  Prenatal Vit-Fe Fumarate-FA (PRENATAL MULTIVITAMIN) TABS tablet Take 1 tablet by mouth daily at 12 noon.   Yes [provider]  azelastine (ASTELIN) 0.1 % nasal spray Place 1 spray into both nostrils 2 (two) times daily. Use in each nostril as directed 05/02/19   Norval Gable, MD  albuterol (PROVENTIL HFA;VENTOLIN HFA) 108 (90 Base) MCG/ACT inhaler Inhale 2 puffs into the lungs every 4 (four) hours as needed for wheezing or shortness of breath. 11/19/17 07/05/19  Lyndal Pulley, DO  Fluticasone Propionate Truett Perna) 93 MCG/ACT EXHU Place 32  sprays into the nose in the morning and at bedtime. 05/27/19 07/05/19  Valentina Shaggy, MD  sucralfate (CARAFATE) 1 g tablet Take 1 tablet (1 g total) by mouth 4 (four) times daily -  with meals and at bedtime. 06/20/19 07/05/19  Mauri Pole, MD    Family History Family History  Problem Relation Age of Onset  . Depression Mother   . Atrial fibrillation Mother   . Hypertension Father   . Colon polyps Father   . Arthritis Maternal Grandmother   . Heart disease Maternal Grandmother   . Cancer Paternal Grandmother        breast  . Hypertension Paternal Grandmother   . Heart disease Paternal Grandfather   . Hypertension Paternal Grandfather   . Diabetes Paternal Grandfather   . Diabetes Mellitus II Sister   . Hypothyroidism Sister   . Colon cancer Neg Hx   . Esophageal cancer Neg Hx   . Rectal cancer Neg Hx   . Stomach cancer Neg Hx     Social History Social History   Tobacco Use  . Smoking status: Former Smoker    Quit date: 11/09/2011    Years since quitting: 7.6  . Smokeless tobacco: Never Used  Vaping Use  . Vaping Use: Never used  Substance Use Topics  . Alcohol use: Not Currently    Alcohol/week: 0.0 standard drinks    Comment: occasionally   . Drug use: No     Allergies   Soy allergy and Montelukast sodium   Review of Systems Review of Systems  Constitutional: Negative for activity change, chills, fatigue and fever.  Gastrointestinal: Positive for nausea. Negative for abdominal pain, anal bleeding and blood in stool.  Genitourinary: Positive for frequency and pelvic pain (lower pelvic pressure). Negative for decreased urine volume, difficulty urinating, dysuria, flank pain, genital sores, hematuria, urgency, vaginal bleeding, vaginal discharge and vaginal pain.  Musculoskeletal: Positive for back pain (lower). Negative for arthralgias and myalgias.  Skin: Negative for color change and rash.  Allergic/Immunologic: Positive for environmental allergies  and food allergies. Negative for immunocompromised state.  Neurological: Negative for dizziness, seizures, syncope, light-headedness, numbness and headaches.  Hematological: Negative for adenopathy. Does not bruise/bleed easily.     Physical Exam Triage Vital Signs ED Triage Vitals  Enc Vitals Group     BP 07/05/19 1247 125/69     Pulse Rate 07/05/19 1247 83     Resp 07/05/19 1247 18     Temp 07/05/19 1247 98.7 F (37.1 C)     Temp Source 07/05/19 1247 Oral     SpO2 07/05/19 1247 100 %     Weight 07/05/19 1244 143 lb (64.9  kg)     Height 07/05/19 1244 5\' 2"  (1.575 m)     Head Circumference --      Peak Flow --      Pain Score 07/05/19 1244 5     Pain Loc --      Pain Edu? --      Excl. in [place]? --    No data found.  Updated Vital Signs BP 125/69 (BP Location: Right Arm)   Pulse 83   Temp 98.7 F (37.1 C) (Oral)   Resp 18   Ht 5\' 2"  (1.575 m)   Wt 143 lb (64.9 kg)   LMP 06/01/2019 (Exact Date)   SpO2 100%   BMI 26.16 kg/m   Visual Acuity Right Eye Distance:   Left Eye Distance:   Bilateral Distance:    Right Eye Near:   Left Eye Near:    Bilateral Near:     Physical Exam Vitals and nursing note reviewed.  Constitutional:      General: She is awake. She is not in acute distress.    Appearance: She is well-developed and well-groomed. She is not ill-appearing.     Comments: She is sitting comfortably on the exam table in no acute distress.   HENT:     Head: Normocephalic and atraumatic.  Cardiovascular:     Rate and Rhythm: Normal rate.  Pulmonary:     Effort: Pulmonary effort is normal.  Abdominal:     General: Abdomen is flat. Bowel sounds are normal. There is no distension.     Palpations: Abdomen is soft.     Tenderness: There is no abdominal tenderness. There is no right CVA tenderness, left CVA tenderness or guarding.  Musculoskeletal:        General: Normal range of motion.  Skin:    General: Skin is warm and dry.     Capillary Refill: Capillary  refill takes less than 2 seconds.     Findings: No rash.  Neurological:     General: No focal deficit present.     Mental Status: She is alert and oriented to person, place, and time.  Psychiatric:        Mood and Affect: Mood normal.        Behavior: Behavior normal. Behavior is cooperative.        Thought Content: Thought content normal.        Judgment: Judgment normal.      UC Treatments / Results  Labs (all labs ordered are listed, but only abnormal results are displayed) Labs Reviewed  URINALYSIS, COMPLETE (UACMP) WITH MICROSCOPIC - Abnormal; Notable for the following components:      Result Value   Hgb urine dipstick TRACE (*)    Bacteria, UA FEW (*)    All other components within normal limits  URINE CULTURE    EKG   Radiology No results found.  Procedures Procedures (including critical care time)  Medications Ordered in UC Medications - No data to display  Initial Impression / Assessment and Plan / UC Course  I have reviewed the triage vital signs and the nursing notes.  Pertinent labs & imaging results that were available during my care of the patient were reviewed by me and considered in my medical decision making (see chart for details).    Reviewed urinalysis results with patient- trace blood and a few bacteria but otherwise normal. Discussed doubt UTI based on urinalysis and symptoms- may be pregnancy-related pelvic pressure. Continue to monitor symptoms. Will send  urine for culture to confirm no UTI. If Pain or pressure worsens, go to the ER ASAP. Otherwise, follow-up pending urine culture results and with her OB as scheduled.  Final Clinical Impressions(s) / UC Diagnoses   Final diagnoses:  Pelvic pressure in pregnancy, first trimester  Increased urinary frequency     Discharge Instructions     Recommend continue to monitor symptoms. It does not appear that you have a urinary tract infection but we sent a urine culture to confirm. Continue to  increase fluid intake. If pain worsens, call your OB or go to the ER ASAP. Follow-up pending urine culture results.     ED Prescriptions    None     PDMP not reviewed this encounter.   Katy Apo, NP 07/05/19 2127

## 2019-07-07 LAB — URINE CULTURE: Culture: NO GROWTH

## 2019-07-15 ENCOUNTER — Encounter: Payer: Self-pay | Admitting: Internal Medicine

## 2019-07-15 DIAGNOSIS — E039 Hypothyroidism, unspecified: Secondary | ICD-10-CM

## 2019-07-16 ENCOUNTER — Other Ambulatory Visit (INDEPENDENT_AMBULATORY_CARE_PROVIDER_SITE_OTHER): Payer: No Typology Code available for payment source

## 2019-07-16 DIAGNOSIS — E039 Hypothyroidism, unspecified: Secondary | ICD-10-CM

## 2019-07-16 LAB — T4, FREE: Free T4: 0.79 ng/dL (ref 0.60–1.60)

## 2019-07-16 LAB — TSH: TSH: 1.32 u[IU]/mL (ref 0.35–4.50)

## 2019-07-17 ENCOUNTER — Other Ambulatory Visit: Payer: Self-pay | Admitting: Internal Medicine

## 2019-07-17 MED ORDER — LEVOTHYROXINE SODIUM 75 MCG PO TABS: 75.0000 ug | ORAL_TABLET | Freq: Every day | ORAL | 0 refills | Status: DC

## 2019-08-07 ENCOUNTER — Ambulatory Visit: Payer: Self-pay

## 2019-08-08 ENCOUNTER — Emergency Department: Payer: PRIVATE HEALTH INSURANCE

## 2019-08-08 ENCOUNTER — Ambulatory Visit (INDEPENDENT_AMBULATORY_CARE_PROVIDER_SITE_OTHER): Payer: No Typology Code available for payment source | Admitting: Internal Medicine

## 2019-08-08 ENCOUNTER — Emergency Department
Admission: EM | Admit: 2019-08-08 | Discharge: 2019-08-08 | Disposition: A | Discharge status: Home / Self Care | Payer: PRIVATE HEALTH INSURANCE | Attending: Student in an Organized Health Care Education/Training Program | Admitting: Student in an Organized Health Care Education/Training Program

## 2019-08-08 ENCOUNTER — Other Ambulatory Visit: Payer: Self-pay

## 2019-08-08 ENCOUNTER — Encounter: Payer: Self-pay | Admitting: Internal Medicine

## 2019-08-08 ENCOUNTER — Emergency Department (HOSPITAL_COMMUNITY)
Admission: EM | Admit: 2019-08-08 | Discharge: 2019-08-08 | Discharge status: Absent without leave | Payer: No Typology Code available for payment source

## 2019-08-08 VITALS — BP 120/84 | HR 72 | Temp 98.7°F | Ht 62.0 in | Wt 143.0 lb

## 2019-08-08 DIAGNOSIS — W228XXA Striking against or struck by other objects, initial encounter: Secondary | ICD-10-CM | POA: Diagnosis not present

## 2019-08-08 DIAGNOSIS — Z3A10 10 weeks gestation of pregnancy: Secondary | ICD-10-CM | POA: Diagnosis not present

## 2019-08-08 DIAGNOSIS — S20219A Contusion of unspecified front wall of thorax, initial encounter: Secondary | ICD-10-CM | POA: Diagnosis not present

## 2019-08-08 DIAGNOSIS — M940 Chondrocostal junction syndrome [Tietze]: Secondary | ICD-10-CM

## 2019-08-08 DIAGNOSIS — Z87891 Personal history of nicotine dependence: Secondary | ICD-10-CM | POA: Insufficient documentation

## 2019-08-08 DIAGNOSIS — J45909 Unspecified asthma, uncomplicated: Secondary | ICD-10-CM | POA: Diagnosis not present

## 2019-08-08 DIAGNOSIS — Y999 Unspecified external cause status: Secondary | ICD-10-CM | POA: Diagnosis not present

## 2019-08-08 DIAGNOSIS — E039 Hypothyroidism, unspecified: Secondary | ICD-10-CM | POA: Diagnosis not present

## 2019-08-08 DIAGNOSIS — O9A211 Injury, poisoning and certain other consequences of external causes complicating pregnancy, first trimester: Secondary | ICD-10-CM | POA: Diagnosis present

## 2019-08-08 DIAGNOSIS — Y9389 Activity, other specified: Secondary | ICD-10-CM | POA: Diagnosis not present

## 2019-08-08 DIAGNOSIS — S299XXA Unspecified injury of thorax, initial encounter: Secondary | ICD-10-CM | POA: Diagnosis not present

## 2019-08-08 DIAGNOSIS — Y929 Unspecified place or not applicable: Secondary | ICD-10-CM | POA: Diagnosis not present

## 2019-08-08 DIAGNOSIS — R102 Pelvic and perineal pain: Secondary | ICD-10-CM

## 2019-08-08 LAB — POC URINALSYSI DIPSTICK (AUTOMATED)
Bilirubin, UA: NEGATIVE
Blood, UA: NEGATIVE
Glucose, UA: NEGATIVE
Ketones, UA: NEGATIVE
Leukocytes, UA: NEGATIVE
Nitrite, UA: NEGATIVE
Protein, UA: NEGATIVE
Spec Grav, UA: 1.01 (ref 1.010–1.025)
Urobilinogen, UA: 0.2 E.U./dL
pH, UA: 8 (ref 5.0–8.0)

## 2019-08-08 MED ORDER — LIDOCAINE 5 % EX PTCH: 1.0000 | MEDICATED_PATCH | CUTANEOUS | 0 refills | Status: DC

## 2019-08-08 NOTE — Assessment & Plan Note (Signed)
Currently pregnant and could be pelvic muscle pain. Has history of kidney stones and U/A done in office negative for infection or blood. Checking US renal to evaluate for kidney stones and sending urine for culture for subacute infection. Monitor symptoms, hydrate well and tylenol for pain as needed.

## 2019-08-08 NOTE — Patient Instructions (Signed)
We have sent in the patches for the chest wall.  We have ordered the ultrasound of the kidneys and bladder in case in a few days you are still having the pain.

## 2019-08-08 NOTE — Addendum Note (Signed)
Addended by: Cresenciano Lick on: 08/08/2019 03:54 PM   Modules accepted: Orders
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
## 2019-08-08 NOTE — Discharge Instructions (Addendum)
Your exam and x-ray are normal at this time.  No indication of any acute pulmonary injury.  You should continue to take Tylenol as needed for pain relief.  You may apply ice and/or moist heat to the chest wall for additional pain relief.  Follow-up with your primary provider return to the ED as needed.
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
## 2019-08-08 NOTE — Progress Notes (Signed)
   Subjective:   Patient ID: Michelle Waller, female    DOB: May 22, 1990, 29 y.o.   MRN: [id number]  HPI The patient is a 29 YO female coming in for concern about kidney stones/pelvic pain. She is currently about 2 months pregnant. Denies bleeding, vaginal discharge, contractions. Has pain with urinating. Does have history of kidney stones on both sides. Has passed kidney stones in the past and this feels somewhat similar. Was not sure if this could be infection also. Denies fevers or chills.   Review of Systems  Constitutional: Negative.   HENT: Negative.   Eyes: Negative.   Respiratory: Negative for cough, chest tightness and shortness of breath.   Cardiovascular: Negative for chest pain, palpitations and leg swelling.  Gastrointestinal: Negative for abdominal distention, abdominal pain, constipation, diarrhea, nausea and vomiting.  Genitourinary: Positive for dysuria and pelvic pain.  Musculoskeletal: Negative.   Skin: Negative.   Neurological: Negative.   Psychiatric/Behavioral: Negative.     Objective:  Physical Exam Constitutional:      Appearance: She is well-developed.  HENT:     Head: Normocephalic and atraumatic.  Cardiovascular:     Rate and Rhythm: Normal rate and regular rhythm.  Pulmonary:     Effort: Pulmonary effort is normal. No respiratory distress.     Breath sounds: Normal breath sounds. No wheezing or rales.     Comments: Midline  Chest:     Chest wall: Tenderness present.  Abdominal:     General: Bowel sounds are normal. There is no distension.     Palpations: Abdomen is soft.     Tenderness: There is abdominal tenderness. There is no rebound.  Musculoskeletal:     Cervical back: Normal range of motion.  Skin:    General: Skin is warm and dry.  Neurological:     Mental Status: She is alert and oriented to person, place, and time.     Coordination: Coordination normal.     Vitals:   08/08/19 1055  BP: 120/84  Pulse: 72  Temp: 98.7 F (37.1 C)    TempSrc: Oral  SpO2: 99%  Weight: 143 lb (64.9 kg)  Height: 5\' 2"  (1.575 m)    This visit occurred during the SARS-CoV-2 public health emergency.  Safety protocols were in place, including screening questions prior to the visit, additional usage of staff PPE, and extensive cleaning of exam room while observing appropriate contact time as indicated for disinfecting solutions.   Assessment & Plan:
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
## 2019-08-09 LAB — URINE CULTURE: Result:: NO GROWTH

## 2019-08-11 ENCOUNTER — Encounter: Payer: Self-pay | Admitting: Internal Medicine

## 2019-08-13 ENCOUNTER — Ambulatory Visit
Admission: RE | Admit: 2019-08-13 | Discharge: 2019-08-13 | Disposition: A | Discharge status: Home / Self Care | Payer: No Typology Code available for payment source | Source: Ambulatory Visit | Attending: Internal Medicine | Admitting: Internal Medicine

## 2019-08-13 DIAGNOSIS — R102 Pelvic and perineal pain: Secondary | ICD-10-CM

## 2019-08-21 LAB — OB RESULTS CONSOLE GC/CHLAMYDIA
Chlamydia: NEGATIVE
Gonorrhea: NEGATIVE

## 2019-08-21 LAB — OB RESULTS CONSOLE HEPATITIS B SURFACE ANTIGEN: Hepatitis B Surface Ag: NEGATIVE

## 2019-08-21 LAB — OB RESULTS CONSOLE RPR: RPR: NONREACTIVE

## 2019-08-21 LAB — OB RESULTS CONSOLE HIV ANTIBODY (ROUTINE TESTING): HIV: NONREACTIVE

## 2019-08-21 LAB — OB RESULTS CONSOLE RUBELLA ANTIBODY, IGM: Rubella: IMMUNE

## 2019-08-26 ENCOUNTER — Ambulatory Visit: Payer: No Typology Code available for payment source | Admitting: Allergy & Immunology

## 2019-09-04 ENCOUNTER — Encounter: Payer: Self-pay | Admitting: Emergency Medicine

## 2019-09-04 ENCOUNTER — Ambulatory Visit
Admission: EM | Admit: 2019-09-04 | Discharge: 2019-09-04 | Disposition: A | Discharge status: Home / Self Care | Payer: No Typology Code available for payment source | Attending: Family Medicine | Admitting: Family Medicine

## 2019-09-04 ENCOUNTER — Other Ambulatory Visit: Payer: Self-pay

## 2019-09-04 DIAGNOSIS — J029 Acute pharyngitis, unspecified: Secondary | ICD-10-CM | POA: Diagnosis not present

## 2019-09-04 LAB — GROUP A STREP BY PCR: Group A Strep by PCR: NOT DETECTED

## 2019-09-04 NOTE — ED Triage Notes (Signed)
Patient c/o sore throat that started yesterday. Patient has positive exposure to strep at work.

## 2019-09-04 NOTE — Discharge Instructions (Addendum)
Strep negative.  Tylenol, tea/honey.  Take care  Dr. Lacinda Axon

## 2019-09-04 NOTE — ED Provider Notes (Signed)
[facility]    CSN: [id number] Arrival date & time: 09/04/19  0809  History   Chief Complaint Chief Complaint  Patient presents with  . Sore Throat   HPI   29 year old female presents with fatigue and sore throat.  Patient reports that she was very fatigued yesterday.  Subsequently developed sore throat and difficulty swallowing.  Has had an individual at work with strep throat.  She is concerned about the possibility of strep throat.  No fever.  No other respiratory symptoms.  No other complaints concerns this time.  Past Medical History:  Diagnosis Date  . Allergy   . Anxiety   . Asthma    physical induced asthma   . Eating disorder   . Frequent headaches   . Gastritis   . GERD (gastroesophageal reflux disease)   . Hiatal hernia   . IBS (irritable bowel syndrome)   . Kidney stones   . Ovarian cyst   . Sjogren's disease ([place])   . Thyroid disease   . UTI (urinary tract infection)   . Vision abnormalities     Patient Active Problem List   Diagnosis Date Noted  . Seasonal and perennial allergic rhinitis 04/16/2019  . Palpitations 01/15/2019  . Hypokalemia 01/15/2019  . Nonallopathic lesion of lumbosacral region 10/02/2017  . Nonallopathic lesion of sacral region 10/02/2017  . Irritable bowel syndrome 06/21/2017  . Routine general medical examination at a health care facility 06/14/2017  . 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 Nonallopathic lesion of thoracic region 04/30/2017  . Nonallopathic lesion of rib cage 04/30/2017  . Nonallopathic lesion of cervical region 04/30/2017  . ANA positive 10/31/2016  . Anxiety 08/15/2016  . Hypothyroidism 02/03/2016  . GERD (gastroesophageal reflux disease) 02/11/2015  . Kidney stones 02/11/2015  . Pelvic pain 08/20/2014    Past Surgical History:  Procedure Laterality Date  . ADENOIDECTOMY    . COLONOSCOPY    . CYSTOSCOPY    . KNEE SURGERY      OB History    Gravida  1   Para  0   Term  0   Preterm    0   AB  0   Living  0     SAB  0   TAB  0   Ectopic  0   Multiple  0   Live Births               Home Medications    Prior to Admission medications   Medication Sig Start Date End Date Taking? Authorizing Provider  cetirizine (ZYRTEC) 10 MG tablet Take 10 mg by mouth daily.   Yes [provider]  cholecalciferol (VITAMIN D3) 25 MCG (1000 UT) tablet Take 1,000 Units by mouth daily.   Yes [provider]  Cyanocobalamin (VITAMIN B 12 PO) Place 1 tablet under the tongue daily.    Yes [provider]  FLUoxetine HCl (PROZAC PO) Take 60 mg by mouth daily.   Yes [provider]  levothyroxine (SYNTHROID) 75 MCG tablet Take 1 tablet (75 mcg total) by mouth daily before breakfast. 07/17/19  Yes Hoyt Koch, MD  Prenatal Vit-Fe Fumarate-FA (PRENATAL MULTIVITAMIN) TABS tablet Take 1 tablet by mouth daily at 12 noon.   Yes [provider]  albuterol (PROVENTIL HFA;VENTOLIN HFA) 108 (90 Base) MCG/ACT inhaler Inhale 2 puffs into the lungs every 4 (four) hours as needed for wheezing or shortness of breath. 11/19/17 07/05/19  Lyndal Pulley, DO  Fluticasone Propionate Truett Perna)  93 MCG/ACT EXHU Place 32 sprays into the nose in the morning and at bedtime. 05/27/19 07/05/19  Valentina Shaggy, MD  omeprazole (PRILOSEC) 40 MG capsule Take 1 capsule (40 mg total) by mouth daily. 06/20/19 09/04/19  Mauri Pole, MD  sucralfate (CARAFATE) 1 g tablet Take 1 tablet (1 g total) by mouth 4 (four) times daily -  with meals and at bedtime. 06/20/19 07/05/19  Mauri Pole, MD    Family History Family History  Problem Relation Age of Onset  . Depression Mother   . Atrial fibrillation Mother   . Hypertension Father   . Colon polyps Father   . Arthritis Maternal Grandmother   . Heart disease Maternal Grandmother   . Cancer Paternal Grandmother        breast  . Hypertension Paternal Grandmother   . Heart disease Paternal Grandfather    . Hypertension Paternal Grandfather   . Diabetes Paternal Grandfather   . Diabetes Mellitus II Sister   . Hypothyroidism Sister   . Colon cancer Neg Hx   . Esophageal cancer Neg Hx   . Rectal cancer Neg Hx   . Stomach cancer Neg Hx     Social History Social History   Tobacco Use  . Smoking status: Former Smoker    Quit date: 11/09/2011    Years since quitting: 7.8  . Smokeless tobacco: Never Used  Vaping Use  . Vaping Use: Never used  Substance Use Topics  . Alcohol use: Not Currently    Alcohol/week: 0.0 standard drinks    Comment: occasionally   . Drug use: No     Allergies   Soy allergy and Montelukast sodium   Review of Systems Review of Systems  Constitutional: Positive for fatigue. Negative for fever.  HENT: Positive for sore throat.    Physical Exam Triage Vital Signs ED Triage Vitals  Enc Vitals Group     BP 09/04/19 0846 120/66     Pulse Rate 09/04/19 0846 79     Resp 09/04/19 0846 18     Temp 09/04/19 0846 98.3 F (36.8 C)     Temp Source 09/04/19 0846 Oral     SpO2 09/04/19 0846 100 %     Weight 09/04/19 0844 142 lb (64.4 kg)     Height 09/04/19 0844 5\' 2"  (1.575 m)     Head Circumference --      Peak Flow --      Pain Score 09/04/19 0843 5     Pain Loc --      Pain Edu? --      Excl. in [place]? --    Updated Vital Signs BP 120/66 (BP Location: Right Arm)   Pulse 79   Temp 98.3 F (36.8 C) (Oral)   Resp 18   Ht 5\' 2"  (1.575 m)   Wt 64.4 kg   LMP 06/01/2019 (Exact Date)   SpO2 100%   BMI 25.97 kg/m   Visual Acuity Right Eye Distance:   Left Eye Distance:   Bilateral Distance:    Right Eye Near:   Left Eye Near:    Bilateral Near:     Physical Exam Vitals reviewed.  Constitutional:      General: She is not in acute distress.    Appearance: She is well-developed. She is not ill-appearing.  HENT:     Head: Normocephalic and atraumatic.     Mouth/Throat:     Pharynx: Oropharynx is clear. No oropharyngeal exudate or posterior  oropharyngeal  erythema.     Tonsils: 0 on the right. 0 on the left.  Cardiovascular:     Rate and Rhythm: Normal rate and regular rhythm.     Heart sounds: No murmur heard.   Pulmonary:     Effort: Pulmonary effort is normal.     Breath sounds: Normal breath sounds.  Neurological:     Mental Status: She is alert.  Psychiatric:        Mood and Affect: Mood normal.        Behavior: Behavior normal.    UC Treatments / Results  Labs (all labs ordered are listed, but only abnormal results are displayed) Labs Reviewed  GROUP A STREP BY PCR    EKG   Radiology No results found.  Procedures Procedures (including critical care time)  Medications Ordered in UC Medications - No data to display  Initial Impression / Assessment and Plan / UC Course  I have reviewed the triage vital signs and the nursing notes.  Pertinent labs & imaging results that were available during my care of the patient were reviewed by me and considered in my medical decision making (see chart for details).    29 year old female presents with viral pharyngitis.  Strep negative today.  Patient is currently pregnant.  Advised Tylenol and supportive care.  Final Clinical Impressions(s) / UC Diagnoses   Final diagnoses:  Viral pharyngitis     Discharge Instructions     Strep negative.  Tylenol, tea/honey.  Take care  Dr. Lacinda Axon    ED Prescriptions    None     PDMP not reviewed this encounter.   Thersa Salt [place], [place] 09/04/19 [phone]

## 2019-09-14 ENCOUNTER — Emergency Department
Admission: EM | Admit: 2019-09-14 | Discharge: 2019-09-14 | Disposition: A | Discharge status: Home / Self Care | Payer: No Typology Code available for payment source | Attending: Emergency Medicine | Admitting: Emergency Medicine

## 2019-09-14 DIAGNOSIS — O219 Vomiting of pregnancy, unspecified: Secondary | ICD-10-CM | POA: Diagnosis present

## 2019-09-14 DIAGNOSIS — Z5321 Procedure and treatment not carried out due to patient leaving prior to being seen by health care provider: Secondary | ICD-10-CM | POA: Insufficient documentation

## 2019-09-14 DIAGNOSIS — Z3A15 15 weeks gestation of pregnancy: Secondary | ICD-10-CM | POA: Insufficient documentation

## 2019-09-14 NOTE — ED Triage Notes (Signed)
Patient states that she is [redacted] weeks pregnant and started having upper abdominal pain, vomiting and diarrhea about 22:00 last night.

## 2019-09-15 ENCOUNTER — Other Ambulatory Visit: Payer: Self-pay

## 2019-09-15 DIAGNOSIS — R1084 Generalized abdominal pain: Secondary | ICD-10-CM

## 2019-09-15 DIAGNOSIS — Z01818 Encounter for other preprocedural examination: Secondary | ICD-10-CM

## 2019-09-15 NOTE — Telephone Encounter (Signed)
Please let patient know that I received her email I am sorry that she continues to have issues with both reflux and altered bowel pattern.  To me it sounds constipation predominant with eventual bowel movement followed by loose stools which are likely the result of constipation and altered motility. Please let her know that she was tested for celiac disease by tissue transglutaminase antibody in 2019 and this was negative. This does not mean that she is not intolerant to gluten, gluten intolerance cannot specifically be tested for  I would recommend the following: Famotidine 20 mg twice daily; if she is still having reflux or heartburn she can increase to 40 mg twice daily I would have her start Benefiber per bottle instruction, this is the best nonpharmacologic remedy, given her pregnancy, to help prevent constipation and the cycle that she describes in her email Okay to continue Colace 100 to 200 mg daily if needed MiraLAX would also be a safe option but I would have her start Benefiber first As I tell all of my pregnant patients, she should run the plan by her obstetrician to be sure there is no objection  She also had a positive ANA and a Sjogren's antibody which was positive.  Was this further evaluated by rheumatology?  It is okay to have her return for a celiac test, tissue transglutaminase antibody and IgG.

## 2019-09-18 ENCOUNTER — Other Ambulatory Visit (INDEPENDENT_AMBULATORY_CARE_PROVIDER_SITE_OTHER): Payer: No Typology Code available for payment source

## 2019-09-18 DIAGNOSIS — R1084 Generalized abdominal pain: Secondary | ICD-10-CM

## 2019-09-18 DIAGNOSIS — Z01818 Encounter for other preprocedural examination: Secondary | ICD-10-CM

## 2019-09-18 DIAGNOSIS — E039 Hypothyroidism, unspecified: Secondary | ICD-10-CM

## 2019-09-18 LAB — TSH: TSH: 1.38 u[IU]/mL (ref 0.35–4.50)

## 2019-09-18 LAB — T4, FREE: Free T4: 0.87 ng/dL (ref 0.60–1.60)

## 2019-09-19 LAB — IGG: IgG (Immunoglobin G), Serum: 908 mg/dL (ref 600–1640)

## 2019-09-19 LAB — TISSUE TRANSGLUTAMINASE, IGA: (tTG) Ab, IgA: 1 U/mL

## 2019-10-01 ENCOUNTER — Encounter: Payer: Self-pay | Admitting: Nurse Practitioner

## 2019-10-01 ENCOUNTER — Ambulatory Visit (INDEPENDENT_AMBULATORY_CARE_PROVIDER_SITE_OTHER): Payer: No Typology Code available for payment source | Admitting: Nurse Practitioner

## 2019-10-01 VITALS — BP 102/76 | HR 75 | Ht 62.0 in | Wt 145.0 lb

## 2019-10-01 DIAGNOSIS — K219 Gastro-esophageal reflux disease without esophagitis: Secondary | ICD-10-CM

## 2019-10-01 DIAGNOSIS — K5900 Constipation, unspecified: Secondary | ICD-10-CM | POA: Diagnosis not present

## 2019-10-01 NOTE — Patient Instructions (Signed)
If you are age 30 or older, your body mass index should be between 23-30. Your Body mass index is 26.52 kg/m. If this is out of the aforementioned range listed, please consider follow up with your Primary Care Provider.  If you are age 107 or younger, your body mass index should be between 19-25. Your Body mass index is 26.52 kg/m. If this is out of the aformentioned range listed, please consider follow up with your Primary Care Provider.   Continue Nexium for duration of pregnancy.   After birth try to transition back to Pepcid.

## 2019-10-01 NOTE — Progress Notes (Signed)
     ASSESSMENT AND PLAN    # Constipation --normal TSH --Constipation resolved. Continue Colace as needed   # GERD --Previously managed well with Pepcid and elevated HOB. GYN recently changed her from pepcid to nexium for acute issues.  --Continue daily Nexium during pregnancy. Recommended she transition back to daily Pepcid after delivery.   # Pregnancy --[redacted] weeks gestation  HISTORY OF PRESENT ILLNESS     Primary Gastroenterologist : Michelle Jarred, MD  Chief Complaint : Recent constipation  Michelle Waller is a 29 y.o. female with PMH / [place] significant for,  but not necessarily limited to: hypothyroidism, GERD  Patient is [redacted] weeks pregnant. She has a history of IBS. Over the last couple of months [name] has been having problems with altered bowel habits. She was going 4-5 days without a BM. Following this she would develop abdominal cramps followed by a normal stool followed by diarrhea. We recommended Miralax but she opted for BIW stool softeners. Stools did improve after a couple of weeks but then in late August she developed N/V/D felt to be food poisoning. She was seen in the ED.  CBC was normal. CMP okay. Symptoms lasted ~ 24 hours. It seems like after this her bowel movements normalized.  She is having a soft BM every other day. In late July her GYN changed GERD med from Pepcid to Nexium Q HS which may be helping with BMs. In the past Protonix gave her diarrhea.  She isn't having any GERD symptoms now.     Past Medical History:  Diagnosis Date  . Allergy   . Anxiety   . Asthma    physical induced asthma   . Eating disorder   . Frequent headaches   . Gastritis   . GERD (gastroesophageal reflux disease)   . Hiatal hernia   . IBS (irritable bowel syndrome)   . Kidney stones   . Ovarian cyst   . Sjogren's disease ([place])   . Thyroid disease   . UTI (urinary tract infection)   . Vision abnormalities     Current Medications, Allergies, Past Surgical History, Family History  and Social History were reviewed in [employer] record.   Review of Systems: No chest pain. No shortness of breath. No urinary complaints.   PHYSICAL EXAM :    Wt Readings from Last 3 Encounters:  10/01/19 145 lb (65.8 kg)  09/04/19 142 lb (64.4 kg)  08/08/19 143 lb (64.9 kg)    BP 102/76   Pulse 75   Ht 5\' 2"  (1.575 m)   Wt 145 lb (65.8 kg)   LMP 06/01/2019 (Exact Date)   BMI 26.52 kg/m  Constitutional:  Pleasant female in no acute distress. Psychiatric: Normal mood and affect. Behavior is normal. EENT: Pupils normal.  Conjunctivae are normal. No scleral icterus. Neck supple.  Cardiovascular: Normal rate, regular rhythm. No edema Pulmonary/chest: Effort normal and breath sounds normal. No wheezing, rales or rhonchi. Abdominal: Soft, nondistended, nontender. Bowel sounds active throughout. There are no masses palpable. No hepatomegaly. Neurological: Alert and oriented to person place and time. Skin: Skin is warm and dry. No rashes noted.  Tye Savoy, NP  10/01/2019, 11:45 AM

## 2019-10-02 NOTE — Progress Notes (Signed)
Addendum: Reviewed and agree with assessment and management plan. Ivette Castronova M, MD  

## 2019-10-14 ENCOUNTER — Other Ambulatory Visit: Payer: Self-pay | Admitting: Internal Medicine

## 2019-10-15 ENCOUNTER — Other Ambulatory Visit: Payer: Self-pay | Admitting: Internal Medicine

## 2019-11-07 ENCOUNTER — Other Ambulatory Visit: Payer: Self-pay

## 2019-11-07 ENCOUNTER — Observation Stay
Admission: EM | Admit: 2019-11-07 | Discharge: 2019-11-07 | Disposition: A | Discharge status: Home / Self Care | Payer: No Typology Code available for payment source | Attending: Obstetrics and Gynecology | Admitting: Obstetrics and Gynecology

## 2019-11-07 ENCOUNTER — Other Ambulatory Visit (HOSPITAL_COMMUNITY): Payer: Self-pay | Admitting: Psychiatry

## 2019-11-07 DIAGNOSIS — O26892 Other specified pregnancy related conditions, second trimester: Principal | ICD-10-CM | POA: Insufficient documentation

## 2019-11-07 DIAGNOSIS — R109 Unspecified abdominal pain: Secondary | ICD-10-CM | POA: Diagnosis not present

## 2019-11-07 DIAGNOSIS — Z3A22 22 weeks gestation of pregnancy: Secondary | ICD-10-CM | POA: Insufficient documentation

## 2019-11-07 LAB — URINALYSIS, COMPLETE (UACMP) WITH MICROSCOPIC
Bacteria, UA: NONE SEEN
Bilirubin Urine: NEGATIVE
Glucose, UA: NEGATIVE mg/dL
Ketones, ur: 5 mg/dL — AB
Leukocytes,Ua: NEGATIVE
Nitrite: NEGATIVE
Protein, ur: NEGATIVE mg/dL
Specific Gravity, Urine: 1.009 (ref 1.005–1.030)
pH: 7 (ref 5.0–8.0)

## 2019-11-07 MED ORDER — OXYCODONE-ACETAMINOPHEN 5-325 MG PO TABS: 1.0000 | ORAL_TABLET | Freq: Four times a day (QID) | ORAL | 0 refills | Status: DC | PRN

## 2019-11-07 MED ORDER — ACETAMINOPHEN 325 MG PO TABS
650.0000 mg | ORAL_TABLET | ORAL | Status: DC | PRN
  Administered 2019-11-07: 650 mg via ORAL
  Filled 2019-11-07: qty 2

## 2019-11-07 NOTE — OB Triage Note (Signed)
Discharge instructions reviewed and pt verbalized understanding. MD ordered prescription to [facility] to be picked up tomorrow morning. Pt in agreement. Pt stable at time of discharge.

## 2019-11-07 NOTE — OB Triage Note (Signed)
Pt is a G1P0 and [redacted]w[redacted]d presenting to L&D with c/o left sided flank pain that began at 0730 this morning. Pt rates pain 6/10 on a 0-10 pain scale. Pt states she took 500mg  of Tylenol this morning at 0800 with little relief. Pt states she has a constant ache in her left flank area and the pain comes intermittently. Pt states pain becomes worse when her bladder is emptied. Pt denies fever. Pt confirms positive fetal movement and denies VB or LOF. Doppler FHT 145. Triage report given to Dr. Georgianne Fick MD.

## 2019-11-07 NOTE — Discharge Summary (Signed)
Physician Final Progress Note  Patient ID: Michelle Waller MRN: [id number] DOB/AGE: Oct 22, 1990 29 y.o.  Admit date: 11/07/2019 Admitting provider: Malachy Mood, MD Discharge date: 11/07/2019   Admission Diagnoses: Flank pain  Discharge Diagnoses:  Active Problems:   Flank pain  29 y.o. G1P0000 at [redacted]w[redacted]d presenting for left sided flank pain with onset earlier today.  The pain comes in waves radiates from the flank into the groin.  No gross  Hematuria.  No fevers or chills.  Pain has improved since initial onset after initially worsening 6/10.  The patient has a know history of nephrolithiasis (calcium oxalate stones), as well as recent renal ultrasound 07/2019 confirming presence of nephrolithiasis.  UA on admission was negative for bacteria but positive for small blood.    Discussed options of overnight observation and straining urine with pain control vs expectant home management with Rx for percocet until stone passage.  Majority of stone will pass without complications within 95-AOZ.  Should symptoms fail to improve or worsen to represent.  Discussed that repeat renal ultrasound may or may note be able to locate location of stone.  Renal stone CT protocol would be more definitive study with the downside of ionizing radiation exposure during pregnancy.  Patient opts for expectant home management at present  Blood pressure 124/68, pulse 84, temperature 97.7 F (36.5 C), temperature source Axillary, resp. rate 18, height 5\' 2"  (1.575 m), weight 66.2 kg, last menstrual period 06/01/2019, SpO2 100 %.  +FHT obtained by nursing staff on admission.  +FM, no LOF, no VB, no contractions.   Pregnancy care thus far has been uncomplicated according to patient.    Consults: None  Significant Findings/ Diagnostic Studies:  Results for orders placed or performed during the hospital encounter of 11/07/19 (from the past 24 hour(s))  Urinalysis, Complete w Microscopic Urine, Clean Catch     Status:  Abnormal   Collection Time: 11/07/19  8:23 PM  Result Value Ref Range   Color, Urine STRAW (A) YELLOW   APPearance CLEAR (A) CLEAR   Specific Gravity, Urine 1.009 1.005 - 1.030   pH 7.0 5.0 - 8.0   Glucose, UA NEGATIVE NEGATIVE mg/dL   Hgb urine dipstick SMALL (A) NEGATIVE   Bilirubin Urine NEGATIVE NEGATIVE   Ketones, ur 5 (A) NEGATIVE mg/dL   Protein, ur NEGATIVE NEGATIVE mg/dL   Nitrite NEGATIVE NEGATIVE   Leukocytes,Ua NEGATIVE NEGATIVE   RBC / HPF 6-10 0 - 5 RBC/hpf   WBC, UA 0-5 0 - 5 WBC/hpf   Bacteria, UA NONE SEEN NONE SEEN   Squamous Epithelial / LPF 0-5 0 - 5     Procedures: None  Discharge Condition: stable  Disposition: Discharge disposition: 01-Home or Self Care       Diet: Regular diet  Discharge Activity: Activity as tolerated  Discharge Instructions    Discharge activity:  No Restrictions   Complete by: As directed    Discharge diet:  No restrictions   Complete by: As directed    No sexual activity restrictions   Complete by: As directed    Notify physician for a general feeling that "something is not right"   Complete by: As directed    Notify physician for increase or change in vaginal discharge   Complete by: As directed    Notify physician for intestinal cramps, with or without diarrhea, sometimes described as "gas pain"   Complete by: As directed    Notify physician for leaking of fluid   Complete by: As directed  Notify physician for low, dull backache, unrelieved by heat or Tylenol   Complete by: As directed    Notify physician for menstrual like cramps   Complete by: As directed    Notify physician for pelvic pressure   Complete by: As directed    Notify physician for uterine contractions.  These may be painless and feel like the uterus is tightening or the baby is  "balling up"   Complete by: As directed    Notify physician for vaginal bleeding   Complete by: As directed    PRETERM LABOR:  Includes any of the follwing symptoms  that occur between 20 - [redacted] weeks gestation.  If these symptoms are not stopped, preterm labor can result in preterm delivery, placing your baby at risk   Complete by: As directed      Allergies as of 11/07/2019      Reactions   Soy Allergy Anaphylaxis   Large amounts cause anaphylaxis Small amounts cause upset stomach   Montelukast Sodium Other (See Comments)   Bad dreams, nightmares      Medication List    TAKE these medications   cetirizine 10 MG tablet Commonly known as: ZYRTEC Take 10 mg by mouth daily.   cholecalciferol 25 MCG (1000 UNIT) tablet Commonly known as: VITAMIN D3 Take 1,000 Units by mouth daily.   levothyroxine 75 MCG tablet Commonly known as: SYNTHROID TAKE 1 TABLET BY MOUTH ONCE DAILY BEFORE BREAKFAST   NexIUM 20 MG packet Generic drug: esomeprazole Take 20 mg by mouth daily before breakfast. Once daily   oxyCODONE-acetaminophen 5-325 MG tablet Commonly known as: Percocet Take 1 tablet by mouth every 6 (six) hours as needed for severe pain.   prenatal multivitamin Tabs tablet Take 1 tablet by mouth daily at 12 noon.   PROZAC PO Take 60 mg by mouth daily.   VITAMIN B 12 PO Place 1 tablet under the tongue daily.        Total time spent taking care of this patient: 30 minutes  Signed: Malachy Mood 11/07/2019, 9:03 PM

## 2019-11-12 ENCOUNTER — Other Ambulatory Visit (INDEPENDENT_AMBULATORY_CARE_PROVIDER_SITE_OTHER): Payer: No Typology Code available for payment source

## 2019-11-12 DIAGNOSIS — E039 Hypothyroidism, unspecified: Secondary | ICD-10-CM

## 2019-11-12 LAB — TSH: TSH: 1.38 u[IU]/mL (ref 0.35–4.50)

## 2019-11-12 LAB — T4, FREE: Free T4: 0.7 ng/dL (ref 0.60–1.60)

## 2019-11-20 ENCOUNTER — Other Ambulatory Visit: Payer: Self-pay | Admitting: Obstetrics and Gynecology

## 2019-11-20 DIAGNOSIS — M3500 Sicca syndrome, unspecified: Secondary | ICD-10-CM

## 2019-11-27 ENCOUNTER — Encounter: Payer: Self-pay | Admitting: *Deleted

## 2019-12-02 ENCOUNTER — Ambulatory Visit: Payer: No Typology Code available for payment source | Attending: Obstetrics and Gynecology

## 2019-12-02 ENCOUNTER — Ambulatory Visit (HOSPITAL_BASED_OUTPATIENT_CLINIC_OR_DEPARTMENT_OTHER): Payer: No Typology Code available for payment source | Admitting: Obstetrics and Gynecology

## 2019-12-02 ENCOUNTER — Other Ambulatory Visit: Payer: Self-pay

## 2019-12-02 ENCOUNTER — Ambulatory Visit: Payer: No Typology Code available for payment source | Admitting: *Deleted

## 2019-12-02 VITALS — BP 114/61 | HR 75 | Ht 62.0 in

## 2019-12-02 DIAGNOSIS — O99112 Other diseases of the blood and blood-forming organs and certain disorders involving the immune mechanism complicating pregnancy, second trimester: Secondary | ICD-10-CM

## 2019-12-02 DIAGNOSIS — O2692 Pregnancy related conditions, unspecified, second trimester: Secondary | ICD-10-CM | POA: Diagnosis not present

## 2019-12-02 DIAGNOSIS — O321XX0 Maternal care for breech presentation, not applicable or unspecified: Secondary | ICD-10-CM | POA: Diagnosis not present

## 2019-12-02 DIAGNOSIS — O2690 Pregnancy related conditions, unspecified, unspecified trimester: Secondary | ICD-10-CM | POA: Insufficient documentation

## 2019-12-02 DIAGNOSIS — E039 Hypothyroidism, unspecified: Secondary | ICD-10-CM

## 2019-12-02 DIAGNOSIS — O361920 Maternal care for other isoimmunization, second trimester, not applicable or unspecified: Secondary | ICD-10-CM | POA: Diagnosis not present

## 2019-12-02 DIAGNOSIS — Z3A26 26 weeks gestation of pregnancy: Secondary | ICD-10-CM

## 2019-12-02 DIAGNOSIS — O99342 Other mental disorders complicating pregnancy, second trimester: Secondary | ICD-10-CM | POA: Diagnosis not present

## 2019-12-02 DIAGNOSIS — Z363 Encounter for antenatal screening for malformations: Secondary | ICD-10-CM

## 2019-12-02 DIAGNOSIS — O99282 Endocrine, nutritional and metabolic diseases complicating pregnancy, second trimester: Secondary | ICD-10-CM

## 2019-12-02 DIAGNOSIS — M3500 Sicca syndrome, unspecified: Secondary | ICD-10-CM

## 2019-12-02 DIAGNOSIS — O3442 Maternal care for other abnormalities of cervix, second trimester: Secondary | ICD-10-CM

## 2019-12-02 NOTE — Progress Notes (Signed)
Maternal-Fetal Medicine   Name: Michelle Waller DOB: January 21, 1991 MRN: [id number] Referring Provider: Donnel Waller, CNM  I had the pleasure of seeing [facility] at [facility]. She is G1 P0 at 26w 1d gestation and is here for ultrasound and consultation because of her medical history of Primary Sjogren's Syndrome (PSS).  PSS was diagnosed in 2018 when the patient had symptoms of mouth and vaginal dryness. She is being followed by her rheumatologist, Michelle Waller, [place], [facility]. Patient reports she is not taking any medications. She was taking plaquenil in the past.  Anti-SSA (Ro) antibodies assessed in Oct 2018 was increased. Anti-SSB (La) antibodies are negative. Patient also reports hypothyroidism and takes levothyroxine supplements. Recent TSH levels are reported to be normal. She does not have any other chronic medical conditions including diabetes or hypertension.  PSH: Adenoidectomy (1995), Right knee arthrosocopy (2007), Endoscopy (2014 and 2018), ureteroscopy for removal of calculus (2014).  Medications: Prenatal vitamins, nasal B12, Nexium, probiotic, Prozac, vitamin D3, iron, colace. Allergies: NKDA. Social history: Denies tobacco or drug or alcohol use. She has been married a year and her husband is in good health. He is Caucasian. Family: Mother has A fib and father has diabetes. No history of venous thromboembolism in the family.  Prenatal course: On cell-free fetal DNA screening, the risks of fetal aneuploidies are not increased.  Ultrasound We performed fetal anatomical survey. No markers of aneuploidies or fetal structural defects are seen. Fetal growth is appropriate for gestational age. Amniotic fluid is normal and good fetal activity is seen. Fetal heart rate and rhythm appeared on several occasions during scan.  Our concerns include: Sjogren's syndrome (SS) in pregnancy:  Pregnancy does not seem to worsen Sjogren's syndrome  and long-term outcomes in mothers are favorable. Effect of SS on pregnancies has been variable. Fetal complications include miscarriage, preterm delivery and growth restriction and stillbirth, although reported, are very rare.  If patient has increasing fatigue and aches (flares), she should be managed as she would be in her nonpregnant state. Corticosteroids, if indicated, are safe in pregnancy.   Anti SSA (Ro) antibodies:  Patient has increased anti-SSA (Ro) antibodies, but anti-SSB antibodies (La) are not increased. The presence of these antibodies can result in more commonly in cutaneous manifestations of neonatal lupus syndrome and less commonly cardiac manifestations including congenital heart block.  Neonatal cutaneous manifestations include scaling, plaque formation and hypopigmentation all of which usually resolve over time. Maternal antibodies are IgG Ro antibodies reactive to Ro and La antigens.    In pregnancy transplacental passage of these antibodies can react with fetal cardiomyocytes and cause inflammation.  Depending on the genetic predisposition and the susceptibility in the fetus, inflammatory reaction can lead to fibrosis and defective conduction mechanism.  Initially, they manifest as first degree heart block leading to second or third degree heart block during the course of pregnancy.  Women with both Ro and La antibodies are slightly more likely to develop congenital heart block.  Overall risk is estimated to be less than 5% and there is a higher incidence of affected fetus in women who suffer from hypothyroidism and having these antibodies.    Serial monitoring (weekly) to identify prolonged PR interval (so that treatment with steroids could be initiated) has not been found to be useful.  There is no accepted treatment for this condition and maternal dexamethasone treatment is of doubtful value.  Women with previously affected infant are more likely to develop atrioventricular block  (15-20%).  I counseled the patient on the possible neonatal manifestations.  I also reassured her that only a very small percentage of babies are likely to be affected. Plaquenil has been found to reduce the likelihood of developing neonatal lupus manifestations. I will defer to her rheumatologist on plaquenil treatment.  I reassured Michelle Waller of normal fetal heart rate and rhythm, and discussed fetal echocardiography. She will check with her insurance and decide.  Overall, we should expect good pregnancy outcomes.  Hypothyroidism is well controlled on levothyroxine. We do not expect any adverse outcomes.  Recommendations: -Fetal growth assessment in 4 and 8 weeks that may be performed at your office. -We can set up an appointment for fetal echocardiography if patient opts to have one. -No early term delivery is indicated.  Thank you for consultation. If you have any questions, please contact me at the [facility]. Consultation including face-to-face counseling 45 minutes.

## 2019-12-05 ENCOUNTER — Encounter (HOSPITAL_COMMUNITY): Payer: Self-pay | Admitting: Obstetrics & Gynecology

## 2019-12-05 ENCOUNTER — Inpatient Hospital Stay (HOSPITAL_COMMUNITY)
Admission: AD | Admit: 2019-12-05 | Discharge: 2019-12-05 | Disposition: A | Discharge status: Home / Self Care | Payer: No Typology Code available for payment source | Attending: Obstetrics & Gynecology | Admitting: Obstetrics & Gynecology

## 2019-12-05 DIAGNOSIS — O479 False labor, unspecified: Secondary | ICD-10-CM | POA: Diagnosis present

## 2019-12-05 DIAGNOSIS — Z3A26 26 weeks gestation of pregnancy: Secondary | ICD-10-CM

## 2019-12-05 DIAGNOSIS — O4702 False labor before 37 completed weeks of gestation, second trimester: Secondary | ICD-10-CM | POA: Diagnosis not present

## 2019-12-05 LAB — URINALYSIS, ROUTINE W REFLEX MICROSCOPIC
Bilirubin Urine: NEGATIVE
Glucose, UA: NEGATIVE mg/dL
Hgb urine dipstick: NEGATIVE
Ketones, ur: NEGATIVE mg/dL
Leukocytes,Ua: NEGATIVE
Nitrite: NEGATIVE
Protein, ur: NEGATIVE mg/dL
Specific Gravity, Urine: 1.021 (ref 1.005–1.030)
pH: 5 (ref 5.0–8.0)

## 2019-12-05 NOTE — MAU Provider Note (Signed)
History     CSN: [id number]  Arrival date and time: 12/05/19 1651   First Provider Initiated Contact with Patient 12/05/19 1804      Chief Complaint  Patient presents with  . Contractions   HPI   [name] is a 29 y.o. female [id number] [redacted]w[redacted]d here in MAU with contractions. She felt the tightening in her abdomen for the first time 1 week ago and then she did not feel it for a while. The "spasms" "tightening" returned 2 days ago and has been coming and going. She has tried tylenol which has helped some. No bleeding. No leaking of water. + fetal movement. No recent intercourse. Since her arrival to MAU she has felt the tightening 5x. Hx of Sjogren syndome; unsure if tightening is preterm labor or related to this.   OB History    Gravida  1   Para  0   Term  0   Preterm  0   AB  0   Living        SAB  0   TAB  0   Ectopic  0   Multiple      Live Births              Past Medical History:  Diagnosis Date  . Allergy   . Anxiety   . Asthma    physical induced asthma   . Eating disorder   . Frequent headaches   . Gastritis   . GERD (gastroesophageal reflux disease)   . Hiatal hernia   . Hypothyroid   . IBS (irritable bowel syndrome)   . Kidney stones   . Obsessive compulsive disorder   . Ovarian cyst   . Sjogren's disease ([place])   . Sjogren's syndrome ([place])   . Thyroid disease   . UTI (urinary tract infection)   . Vaginal Pap smear, abnormal   . Vision abnormalities     Past Surgical History:  Procedure Laterality Date  . ADENOIDECTOMY    . CERVIX LESION DESTRUCTION    . COLONOSCOPY    . CYSTOSCOPY    . KIDNEY STONE SURGERY    . KNEE ARTHROSCOPY    . KNEE SURGERY    . TONSILLECTOMY      Family History  Problem Relation Age of Onset  . Thyroid disease Mother   . Hypertension Father   . Diabetes Father   . Thyroid disease Sister   . Cancer Paternal Grandmother        breast  . Diabetes Paternal Grandfather   . Heart disease  Paternal Grandfather   . Depression Mother   . Atrial fibrillation Mother   . Colon polyps Father   . Arthritis Maternal Grandmother   . Heart disease Maternal Grandmother   . Hypertension Paternal Grandmother   . Hypertension Paternal Grandfather   . Diabetes Mellitus II Sister   . Hypothyroidism Sister   . Colon cancer Neg Hx   . Esophageal cancer Neg Hx   . Rectal cancer Neg Hx   . Stomach cancer Neg Hx     Social History   Tobacco Use  . Smoking status: Former Smoker    Quit date: 11/09/2011    Years since quitting: 8.0  . Smokeless tobacco: Never Used  Vaping Use  . Vaping Use: Never used  Substance Use Topics  . Alcohol use: Not Currently    Comment: occasionally   . Drug use: Never    Allergies:  Allergies  Allergen Reactions  .  Soy Allergy Anaphylaxis    Large amounts cause anaphylaxis Small amounts cause upset stomach  . Medrol [Methylprednisolone] Hives  . Montelukast Sodium Other (See Comments)    Bad dreams, nightmares    Medications Prior to Admission  Medication Sig Dispense Refill Last Dose  . cetirizine (ZYRTEC) 10 MG tablet Take 10 mg by mouth daily.   12/04/2019 at Unknown time  . cholecalciferol (VITAMIN D3) 25 MCG (1000 UT) tablet Take 1,000 Units by mouth daily.   12/04/2019 at Unknown time  . Cyanocobalamin (VITAMIN B 12 PO) Place 1 tablet under the tongue daily.    12/05/2019 at Unknown time  . docusate sodium (COLACE) 100 MG capsule Take 100 mg by mouth 2 (two) times daily.   12/04/2019 at Unknown time  . esomeprazole (NEXIUM) 20 MG packet Take 20 mg by mouth daily before breakfast. Once daily    12/04/2019 at Unknown time  . FLUoxetine HCl (PROZAC PO) Take 60 mg by mouth daily.   12/04/2019 at Unknown time  . levothyroxine (SYNTHROID) 75 MCG tablet TAKE 1 TABLET BY MOUTH ONCE DAILY BEFORE BREAKFAST 90 tablet 1 12/05/2019 at Unknown time  . polyethylene glycol (MIRALAX / GLYCOLAX) 17 g packet Take 17 g by mouth daily.   Past Week at Unknown  time  . Prenatal Vit-Fe Fumarate-FA (PRENATAL MULTIVITAMIN) TABS tablet Take 1 tablet by mouth daily at 12 noon.   12/05/2019 at Unknown time  . levothyroxine (SYNTHROID) 75 MCG tablet Take 75 mcg by mouth daily before breakfast.     . oxyCODONE-acetaminophen (PERCOCET) 5-325 MG tablet Take 1 tablet by mouth every 6 (six) hours as needed for severe pain. (Patient not taking: Reported on 12/02/2019) 12 tablet 0   . Prenatal Vit-Fe Fumarate-FA (PRENATAL VITAMINS PO) Take by mouth.      Results for orders placed or performed during the hospital encounter of 12/05/19 (from the past 72 hour(s))  Urinalysis, Routine w reflex microscopic Urine, Clean Catch     Status: Abnormal   Collection Time: 12/05/19  5:29 PM  Result Value Ref Range   Color, Urine YELLOW YELLOW   APPearance HAZY (A) CLEAR   Specific Gravity, Urine 1.021 1.005 - 1.030   pH 5.0 5.0 - 8.0   Glucose, UA NEGATIVE NEGATIVE mg/dL   Hgb urine dipstick NEGATIVE NEGATIVE   Bilirubin Urine NEGATIVE NEGATIVE   Ketones, ur NEGATIVE NEGATIVE mg/dL   Protein, ur NEGATIVE NEGATIVE mg/dL   Nitrite NEGATIVE NEGATIVE   Leukocytes,Ua NEGATIVE NEGATIVE    Comment: Performed at [facility] [address] [place], [place] [address]   Review of Systems  Constitutional: Negative for fever.  Gastrointestinal: Positive for abdominal pain.  Genitourinary: Negative for vaginal bleeding and vaginal discharge.   Physical Exam   Blood pressure 120/72, pulse 95, temperature 98.9 F (37.2 C), resp. rate 18, height 5\' 2"  (1.575 m), weight 70.9 kg, last menstrual period 06/02/2019, SpO2 100 %.  Physical Exam Constitutional:      General: She is not in acute distress.    Appearance: Normal appearance. She is not ill-appearing, toxic-appearing or diaphoretic.  HENT:     Head: Normocephalic.  Abdominal:     General: Abdomen is flat.     Tenderness: There is no abdominal tenderness.  Genitourinary:    Comments: Dilation: Closed Effacement  (%): Thick Exam by:: Altamease Oiler, NP Neurological:     Mental Status: She is alert and oriented to person, place, and time.  Psychiatric:  Behavior: Behavior normal.   Fetal Tracing: Baseline: 125 bpm Variability: Moderate  Accelerations: 15x15 Decelerations: None Toco: UI MAU Course  Procedures  None  MDM  FFN collected however not sent d/t closed cervix. Oral fluids pushed in MAU; UI now gone. Toco quiet. Patient rates pain 3/10. Feels improved Notified D. Eddie Dibbles CNM of patient arrival to MAU- I recommend f/u in the office next week. Melina Copa recommends patient call the office for f/u.   Assessment and Plan   A:  1. Braxton Hick's contraction   2. [redacted] weeks gestation of pregnancy     P:  Discharge home in stable condition Pre-term labor precautions Return to MAU if symptoms worsen Oral fluid hydration encouraged   Caitlen Worth, Artist Pais, NP 12/05/2019 7:56 PM

## 2019-12-05 NOTE — MAU Note (Signed)
Starting 2 days ago, noted [place] were coming more frequently and were painful.  Called MD, was told to come here and get checked. No bleeding no LOF, no hx of PTL. Denies GI or GU problems.

## 2020-01-03 ENCOUNTER — Ambulatory Visit
Admission: RE | Admit: 2020-01-03 | Discharge: 2020-01-03 | Disposition: A | Discharge status: Home / Self Care | Payer: No Typology Code available for payment source | Source: Ambulatory Visit | Attending: Physician Assistant | Admitting: Physician Assistant

## 2020-01-03 ENCOUNTER — Other Ambulatory Visit: Payer: Self-pay

## 2020-01-03 VITALS — BP 133/64 | HR 75 | Temp 98.4°F | Resp 14 | Ht 62.0 in | Wt 158.0 lb

## 2020-01-03 DIAGNOSIS — R059 Cough, unspecified: Secondary | ICD-10-CM | POA: Diagnosis not present

## 2020-01-03 DIAGNOSIS — O99513 Diseases of the respiratory system complicating pregnancy, third trimester: Secondary | ICD-10-CM | POA: Diagnosis not present

## 2020-01-03 DIAGNOSIS — R052 Subacute cough: Secondary | ICD-10-CM | POA: Insufficient documentation

## 2020-01-03 DIAGNOSIS — Z7951 Long term (current) use of inhaled steroids: Secondary | ICD-10-CM | POA: Diagnosis not present

## 2020-01-03 DIAGNOSIS — K219 Gastro-esophageal reflux disease without esophagitis: Secondary | ICD-10-CM | POA: Insufficient documentation

## 2020-01-03 DIAGNOSIS — Z79899 Other long term (current) drug therapy: Secondary | ICD-10-CM | POA: Diagnosis not present

## 2020-01-03 DIAGNOSIS — O99613 Diseases of the digestive system complicating pregnancy, third trimester: Secondary | ICD-10-CM | POA: Insufficient documentation

## 2020-01-03 DIAGNOSIS — Z3A30 30 weeks gestation of pregnancy: Secondary | ICD-10-CM

## 2020-01-03 DIAGNOSIS — Z20822 Contact with and (suspected) exposure to covid-19: Secondary | ICD-10-CM | POA: Insufficient documentation

## 2020-01-03 DIAGNOSIS — Z79890 Hormone replacement therapy (postmenopausal): Secondary | ICD-10-CM | POA: Diagnosis not present

## 2020-01-03 DIAGNOSIS — J45909 Unspecified asthma, uncomplicated: Secondary | ICD-10-CM | POA: Insufficient documentation

## 2020-01-03 DIAGNOSIS — O99343 Other mental disorders complicating pregnancy, third trimester: Secondary | ICD-10-CM | POA: Insufficient documentation

## 2020-01-03 DIAGNOSIS — Z87891 Personal history of nicotine dependence: Secondary | ICD-10-CM | POA: Insufficient documentation

## 2020-01-03 DIAGNOSIS — F419 Anxiety disorder, unspecified: Secondary | ICD-10-CM | POA: Insufficient documentation

## 2020-01-03 DIAGNOSIS — M3500 Sicca syndrome, unspecified: Secondary | ICD-10-CM | POA: Diagnosis not present

## 2020-01-03 DIAGNOSIS — O26893 Other specified pregnancy related conditions, third trimester: Secondary | ICD-10-CM | POA: Insufficient documentation

## 2020-01-03 DIAGNOSIS — O99891 Other specified diseases and conditions complicating pregnancy: Secondary | ICD-10-CM | POA: Insufficient documentation

## 2020-01-03 DIAGNOSIS — R0602 Shortness of breath: Secondary | ICD-10-CM | POA: Diagnosis present

## 2020-01-03 DIAGNOSIS — R0789 Other chest pain: Secondary | ICD-10-CM | POA: Diagnosis not present

## 2020-01-03 LAB — RESP PANEL BY RT-PCR (FLU A&B, COVID) ARPGX2
Influenza A by PCR: NEGATIVE
Influenza B by PCR: NEGATIVE
SARS Coronavirus 2 by RT PCR: NEGATIVE

## 2020-01-03 NOTE — Discharge Instructions (Addendum)
The Covid and flu test were negative today.  An EKG was also performed which did not reveal any significant abnormalities.  Please keep your appointment with your OB/gynecologist in 2 days.  For continued or worsening symptoms before your visit, please proceed to:   [place] [facility] [address] [place],  [place]  [id number]

## 2020-01-03 NOTE — ED Triage Notes (Signed)
Patient c/o cough and SOB that started 2 week ago.  Patient denies fevers.  Patient reports some chest tightness.

## 2020-01-03 NOTE — ED Provider Notes (Addendum)
[facility]    CSN: [id number] Arrival date & time: 01/03/20  0855      History   Chief Complaint Chief Complaint  Patient presents with  . Shortness of Breath  . Cough    HPI Michelle Waller is a 29 y.o. female who is [redacted] weeks pregnant.  Patient presents for 1 month history of chest tightness and shortness of breath on exertion which she says is "probably due to the pregnancy."  She states she has talked to her OB/GYN about this and has been told to take Mucinex.  Patient states that Mucinex helps her symptoms.  Patient denies any worsening of her symptoms recently.  She states that she is also had a cough for this same amount of time.  Patient states that she was in contact with someone else who was in contact with someone who had COVID-19.  Patient was not a direct contact with anyone who had COVID-19.  Patient has been vaccinated for COVID-19.  She states that her primary concern is to rule out COVID-19.  Patient has an appointment with her OB/GYN in 2 days.  Patient denies any history of cardiopulmonary disease.  She has seen a cardiologist a few months ago and had a full work-up which was normal.  Past medical history is significant for Sjogren's disease, hypothyroidism of pregnancy, GERD, anxiety, and asthma with physical exertion.  No other complaints or concerns today.  HPI  Past Medical History:  Diagnosis Date  . Allergy   . Anxiety   . Asthma    physical induced asthma   . Eating disorder   . Frequent headaches   . Gastritis   . GERD (gastroesophageal reflux disease)   . Hiatal hernia   . Hypothyroid   . IBS (irritable bowel syndrome)   . Kidney stones   . Obsessive compulsive disorder   . Ovarian cyst   . Sjogren's disease ([place])   . Sjogren's syndrome ([place])   . Thyroid disease   . UTI (urinary tract infection)   . Vaginal Pap smear, abnormal   . Vision abnormalities     Patient Active Problem List   Diagnosis Date Noted  . Flank pain  11/07/2019  . Seasonal and perennial allergic rhinitis 04/16/2019  . Palpitations 01/15/2019  . Hypokalemia 01/15/2019  . Nonallopathic lesion of lumbosacral region 10/02/2017  . Nonallopathic lesion of sacral region 10/02/2017  . Irritable bowel syndrome 06/21/2017  . Routine general medical examination at a health care facility 06/14/2017  . 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 Nonallopathic lesion of thoracic region 04/30/2017  . Nonallopathic lesion of rib cage 04/30/2017  . Nonallopathic lesion of cervical region 04/30/2017  . ANA positive 10/31/2016  . Anxiety 08/15/2016  . Hypothyroidism 02/03/2016  . GERD (gastroesophageal reflux disease) 02/11/2015  . Kidney stones 02/11/2015  . Pelvic pain 08/20/2014    Past Surgical History:  Procedure Laterality Date  . ADENOIDECTOMY    . CERVIX LESION DESTRUCTION    . COLONOSCOPY    . CYSTOSCOPY    . KIDNEY STONE SURGERY    . KNEE ARTHROSCOPY    . KNEE SURGERY    . TONSILLECTOMY      OB History    Gravida  1   Para  0   Term  0   Preterm  0   AB  0   Living        SAB  0   IAB  0   Ectopic  0  Multiple      Live Births               Home Medications    Prior to Admission medications   Medication Sig Start Date End Date Taking? Authorizing Provider  cetirizine (ZYRTEC) 10 MG tablet Take 10 mg by mouth daily.   Yes [provider]  cholecalciferol (VITAMIN D3) 25 MCG (1000 UT) tablet Take 1,000 Units by mouth daily.   Yes [provider]  Cyanocobalamin (VITAMIN B 12 PO) Place 1 tablet under the tongue daily.    Yes [provider]  esomeprazole (NEXIUM) 20 MG packet Take 20 mg by mouth daily before breakfast. Once daily   Yes [provider]  FLUoxetine HCl (PROZAC PO) Take 60 mg by mouth daily.   Yes [provider]  levothyroxine (SYNTHROID) 75 MCG tablet TAKE 1 TABLET BY MOUTH ONCE DAILY BEFORE BREAKFAST 10/15/19  Yes Hoyt Koch, MD  Prenatal  Vit-Fe Fumarate-FA (PRENATAL MULTIVITAMIN) TABS tablet Take 1 tablet by mouth daily at 12 noon.   Yes [provider]  Prenatal Vit-Fe Fumarate-FA (PRENATAL VITAMINS PO) Take by mouth.   Yes [provider]  docusate sodium (COLACE) 100 MG capsule Take 100 mg by mouth 2 (two) times daily.    [provider]  levothyroxine (SYNTHROID) 75 MCG tablet Take 75 mcg by mouth daily before breakfast.    [provider]  polyethylene glycol (MIRALAX / GLYCOLAX) 17 g packet Take 17 g by mouth daily.    [provider]  albuterol (PROVENTIL HFA;VENTOLIN HFA) 108 (90 Base) MCG/ACT inhaler Inhale 2 puffs into the lungs every 4 (four) hours as needed for wheezing or shortness of breath. 11/19/17 07/05/19  Lyndal Pulley, DO  Fluticasone Propionate Truett Perna) 93 MCG/ACT EXHU Place 32 sprays into the nose in the morning and at bedtime. 05/27/19 07/05/19  Valentina Shaggy, MD  omeprazole (PRILOSEC) 40 MG capsule Take 1 capsule (40 mg total) by mouth daily. 06/20/19 09/04/19  Mauri Pole, MD  sucralfate (CARAFATE) 1 g tablet Take 1 tablet (1 g total) by mouth 4 (four) times daily -  with meals and at bedtime. 06/20/19 07/05/19  Mauri Pole, MD    Family History Family History  Problem Relation Age of Onset  . Thyroid disease Mother   . Hypertension Father   . Diabetes Father   . Thyroid disease Sister   . Cancer Paternal Grandmother        breast  . Diabetes Paternal Grandfather   . Heart disease Paternal Grandfather   . Depression Mother   . Atrial fibrillation Mother   . Colon polyps Father   . Arthritis Maternal Grandmother   . Heart disease Maternal Grandmother   . Hypertension Paternal Grandmother   . Hypertension Paternal Grandfather   . Diabetes Mellitus II Sister   . Hypothyroidism Sister   . Colon cancer Neg Hx   . Esophageal cancer Neg Hx   . Rectal cancer Neg Hx   . Stomach cancer Neg Hx     Social History Social History    Tobacco Use  . Smoking status: Former Smoker    Quit date: 11/09/2011    Years since quitting: 8.1  . Smokeless tobacco: Never Used  Vaping Use  . Vaping Use: Never used  Substance Use Topics  . Alcohol use: Not Currently    Comment: occasionally   . Drug use: Never     Allergies   Soy allergy, Medrol [  methylprednisolone], and Montelukast sodium   Review of Systems Review of Systems  Constitutional: Negative for chills, diaphoresis, fatigue and fever.  HENT: Positive for congestion. Negative for ear pain, rhinorrhea, sinus pressure, sinus pain and sore throat.   Respiratory: Positive for cough, chest tightness and shortness of breath. Negative for wheezing.   Cardiovascular: Negative for chest pain and palpitations.  Gastrointestinal: Negative for abdominal pain, nausea and vomiting.  Musculoskeletal: Negative for arthralgias and myalgias.  Skin: Negative for rash.  Neurological: Negative for weakness and headaches.  Hematological: Negative for adenopathy.     Physical Exam Triage Vital Signs ED Triage Vitals  Enc Vitals Group     BP 01/03/20 0909 133/64     Pulse Rate 01/03/20 0909 75     Resp 01/03/20 0909 14     Temp 01/03/20 0909 98.4 F (36.9 C)     Temp Source 01/03/20 0909 Oral     SpO2 01/03/20 0909 100 %     Weight 01/03/20 0906 158 lb (71.7 kg)     Height 01/03/20 0906 5\' 2"  (1.575 m)     Head Circumference --      Peak Flow --      Pain Score 01/03/20 0906 4     Pain Loc --      Pain Edu? --      Excl. in [place]? --    No data found.  Updated Vital Signs BP 133/64 (BP Location: Right Arm)   Pulse 75   Temp 98.4 F (36.9 C) (Oral)   Resp 14   Ht 5\' 2"  (1.575 m)   Wt 158 lb (71.7 kg)   LMP 06/02/2019   SpO2 100%   BMI 28.90 kg/m        Physical Exam Vitals and nursing note reviewed.  Constitutional:      General: She is not in acute distress.    Appearance: Normal appearance. She is not ill-appearing or toxic-appearing.     Comments:  Pregnant female  HENT:     Head: Normocephalic and atraumatic.  Eyes:     General: No scleral icterus.       Right eye: No discharge.        Left eye: No discharge.     Conjunctiva/sclera: Conjunctivae normal.  Cardiovascular:     Rate and Rhythm: Normal rate and regular rhythm.     Heart sounds: Normal heart sounds.  Pulmonary:     Effort: Pulmonary effort is normal. No respiratory distress.     Breath sounds: Normal breath sounds. No wheezing, rhonchi or rales.  Musculoskeletal:     Cervical back: Neck supple.     Right lower leg: No edema.     Left lower leg: No edema.  Skin:    General: Skin is dry.  Neurological:     General: No focal deficit present.     Mental Status: She is alert. Mental status is at baseline.     Motor: No weakness.     Gait: Gait normal.  Psychiatric:        Mood and Affect: Mood normal.        Behavior: Behavior normal.        Thought Content: Thought content normal.      UC Treatments / Results  Labs (all labs ordered are listed, but only abnormal results are displayed) Labs Reviewed  RESP PANEL BY RT-PCR (FLU A&B, COVID) ARPGX2    EKG   Radiology No results found.  Procedures ED  EKG  Date/Time: 01/03/2020 9:29 AM Performed by: Danton Clap, PA-C Authorized by: Danton Clap, PA-C   ECG reviewed by ED Physician in the absence of a cardiologist: yes   Previous ECG:    Previous ECG:  Unavailable Interpretation:    Interpretation: non-specific     Details:  Short PR Rate:    ECG rate:  78   ECG rate assessment: normal   Rhythm:    Rhythm: sinus rhythm   Ectopy:    Ectopy: none   QRS:    QRS axis:  Normal ST segments:    ST segments:  Normal T waves:    T waves: normal   Q waves:    Abnormal Q-waves: not present   Comments:     Normal sinus rhythm and rate, shortened PR 104 ms   (including critical care time)  Medications Ordered in UC Medications - No data to display  Initial Impression / Assessment and  Plan / UC Course  I have reviewed the triage vital signs and the nursing notes.  Pertinent labs & imaging results that were available during my care of the patient were reviewed by me and considered in my medical decision making (see chart for details).   29 year old well-appearing 30-week pregnant female presenting for self-reported 1 month history of cough, shortness of breath, and chest tightness.  Patient denies any recent worsening of symptoms.  She admits her primary concern is to get a COVID-19 test today.  Denies any exposure.  Chest tightness is a little worse with exertion and symptoms do improve a little bit with Mucinex.   All vital signs are stable and patient is well-appearing and in no acute distress today.  Heart rate normal and she is afebrile.  Oxygen 100%.  Heart regular rate and rhythm and chest is clear to auscultation.  No leg swelling.  Respiratory panel obtained today which is negative.  EKG obtained today which shows normal sinus rhythm and rate with shortened PR interval.  Unable to find any previous EKGs to compare to.  I was able to read cardiology visit notes from May 2021 stating normal echo and cardiac work-up.  Patient has been complaining of 14-month history of chest pain and shortness of breath at that time.  Discussed test results with patient.  Discussed various causes of cough, chest tightness and shortness of breath.  Advised that some of the condition she already has could be related to that including pregnancy, GERD with hiatal hernia, asthma, and anxiety.  Patient is stable and well-appearing at this time.  Advised her to keep her appointment with her OB in 2 days, but advised her to follow-up at the emergency department or [facility].  Reviewed ED red flags.   Final Clinical Impressions(s) / UC Diagnoses   Final diagnoses:  Shortness of breath  Cough  Chest tightness  [redacted] weeks gestation of pregnancy     Discharge  Instructions     The Covid and flu test were negative today.  An EKG was also performed which did not reveal any significant abnormalities.  Please keep your appointment with your OB/gynecologist in 2 days.  For continued or worsening symptoms before your visit, please proceed to:   [facility] [address] [place],  [place]  [id number]    ED Prescriptions    None     PDMP not reviewed this encounter.   Carlyon Prows,  Kennis Carina, PA-C 01/03/20 1018    Laurene Footman B, PA-C 01/03/20 1019

## 2020-01-19 ENCOUNTER — Other Ambulatory Visit (INDEPENDENT_AMBULATORY_CARE_PROVIDER_SITE_OTHER): Payer: No Typology Code available for payment source

## 2020-01-19 DIAGNOSIS — E039 Hypothyroidism, unspecified: Secondary | ICD-10-CM

## 2020-01-19 LAB — TSH: TSH: 2.51 u[IU]/mL (ref 0.35–4.50)

## 2020-01-19 LAB — T4, FREE: Free T4: 0.63 ng/dL (ref 0.60–1.60)

## 2020-01-21 ENCOUNTER — Encounter: Payer: Self-pay | Admitting: Internal Medicine

## 2020-01-22 ENCOUNTER — Other Ambulatory Visit: Payer: Self-pay | Admitting: Internal Medicine

## 2020-01-22 DIAGNOSIS — E039 Hypothyroidism, unspecified: Secondary | ICD-10-CM

## 2020-01-24 NOTE — L&D Delivery Note (Signed)
Delivery Note Labor onset: 02/18/2020  Labor Onset Time:2200 Complete dilation at 2:21 AM  Onset of pushing at 2:38 AM FHR second stage Cat 2 Analgesia/Anesthesia intrapartum: epidural  Guided pushing with maternal urge. Delivery of a viable Female at 95. Fetal head delivered in LOA position. Nuchal cord X1. Infant placed on maternal abd, dried, and tactile stim.  Cord double clamped after pulsation ceased and cut by FOB. Charge nurse present for birth. Cord blood sample collected.  Placenta delivered Michelle Waller per brandt andrews maneuver, intact, with 3 VC.  Placenta to L&D. Uterine tone firm U/2, bleeding minimal  Right labial laceration identified.  Anesthesia: epidural Repair: 3-0 vicryl QBL/EBL (mL): 856 Complications: N/A APGAR: APGAR (1 MIN): 8   APGAR (5 MINS): 9   APGAR (10 MINS):   Mom to postpartum.  Baby to Couplet care / Skin to Skin   Will notify Dr Charlesetta Garibaldi of delivery and POC.  Clio MSN, CNM 02/19/2020, 3:43 AM

## 2020-01-27 ENCOUNTER — Encounter (HOSPITAL_COMMUNITY): Payer: Self-pay | Admitting: Obstetrics & Gynecology

## 2020-01-27 ENCOUNTER — Observation Stay (HOSPITAL_COMMUNITY)
Admission: AD | Admit: 2020-01-27 | Discharge: 2020-01-28 | Disposition: A | Discharge status: Home / Self Care | Payer: No Typology Code available for payment source | Attending: Obstetrics & Gynecology | Admitting: Obstetrics & Gynecology

## 2020-01-27 DIAGNOSIS — Z87891 Personal history of nicotine dependence: Secondary | ICD-10-CM | POA: Diagnosis not present

## 2020-01-27 DIAGNOSIS — O4703 False labor before 37 completed weeks of gestation, third trimester: Secondary | ICD-10-CM | POA: Diagnosis present

## 2020-01-27 DIAGNOSIS — E039 Hypothyroidism, unspecified: Secondary | ICD-10-CM | POA: Diagnosis not present

## 2020-01-27 DIAGNOSIS — Z20822 Contact with and (suspected) exposure to covid-19: Secondary | ICD-10-CM | POA: Insufficient documentation

## 2020-01-27 DIAGNOSIS — J45909 Unspecified asthma, uncomplicated: Secondary | ICD-10-CM | POA: Diagnosis not present

## 2020-01-27 DIAGNOSIS — Z3A34 34 weeks gestation of pregnancy: Secondary | ICD-10-CM | POA: Diagnosis not present

## 2020-01-27 LAB — URINALYSIS, ROUTINE W REFLEX MICROSCOPIC
Bilirubin Urine: NEGATIVE
Glucose, UA: NEGATIVE mg/dL
Ketones, ur: NEGATIVE mg/dL
Leukocytes,Ua: NEGATIVE
Nitrite: NEGATIVE
Protein, ur: NEGATIVE mg/dL
Specific Gravity, Urine: 1.001 — ABNORMAL LOW (ref 1.005–1.030)
pH: 7 (ref 5.0–8.0)

## 2020-01-27 LAB — RESP PANEL BY RT-PCR (FLU A&B, COVID) ARPGX2
Influenza A by PCR: NEGATIVE
Influenza B by PCR: NEGATIVE
SARS Coronavirus 2 by RT PCR: NEGATIVE

## 2020-01-27 LAB — OB RESULTS CONSOLE GBS: GBS: NEGATIVE

## 2020-01-27 MED ORDER — LACTATED RINGERS IV SOLN: Freq: Once | INTRAVENOUS | Status: AC

## 2020-01-27 MED ORDER — PRENATAL MULTIVITAMIN CH
1.0000 | ORAL_TABLET | Freq: Every day | ORAL | Status: DC
  Administered 2020-01-28: 1 via ORAL
  Filled 2020-01-27: qty 1

## 2020-01-27 MED ORDER — OXYCODONE HCL 5 MG PO TABS
10.0000 mg | ORAL_TABLET | ORAL | Status: DC | PRN
  Administered 2020-01-27: 10 mg via ORAL
  Filled 2020-01-27: qty 2

## 2020-01-27 MED ORDER — NIFEDIPINE 10 MG PO CAPS
10.0000 mg | ORAL_CAPSULE | ORAL | Status: DC | PRN
  Administered 2020-01-27 (×3): 10 mg via ORAL
  Filled 2020-01-27 (×3): qty 1

## 2020-01-27 MED ORDER — CALCIUM CARBONATE ANTACID 500 MG PO CHEW
2.0000 | CHEWABLE_TABLET | ORAL | Status: DC | PRN
  Administered 2020-01-28: 400 mg via ORAL
  Filled 2020-01-27: qty 2

## 2020-01-27 MED ORDER — LACTATED RINGERS IV SOLN: INTRAVENOUS | Status: DC

## 2020-01-27 MED ORDER — ONDANSETRON HCL 4 MG PO TABS: 4.0000 mg | ORAL_TABLET | Freq: Three times a day (TID) | ORAL | Status: DC | PRN

## 2020-01-27 MED ORDER — ACETAMINOPHEN 500 MG PO TABS
1000.0000 mg | ORAL_TABLET | Freq: Once | ORAL | Status: AC
  Administered 2020-01-27: 1000 mg via ORAL
  Filled 2020-01-27: qty 2

## 2020-01-27 MED ORDER — ACETAMINOPHEN 325 MG PO TABS
650.0000 mg | ORAL_TABLET | ORAL | Status: DC | PRN
  Administered 2020-01-28: 650 mg via ORAL
  Filled 2020-01-27: qty 2

## 2020-01-27 MED ORDER — ZOLPIDEM TARTRATE 5 MG PO TABS: 5.0000 mg | ORAL_TABLET | Freq: Every evening | ORAL | Status: DC | PRN

## 2020-01-27 MED ORDER — DOCUSATE SODIUM 100 MG PO CAPS
100.0000 mg | ORAL_CAPSULE | Freq: Every day | ORAL | Status: DC
  Administered 2020-01-28: 100 mg via ORAL
  Filled 2020-01-27: qty 1

## 2020-01-27 MED ORDER — LEVOTHYROXINE SODIUM 25 MCG PO TABS
75.0000 ug | ORAL_TABLET | Freq: Every day | ORAL | Status: DC
  Administered 2020-01-28: 75 ug via ORAL
  Filled 2020-01-27 (×2): qty 3

## 2020-01-27 MED ORDER — BETAMETHASONE SOD PHOS & ACET 6 (3-3) MG/ML IJ SUSP
12.0000 mg | INTRAMUSCULAR | Status: AC
  Administered 2020-01-27 – 2020-01-28 (×2): 12 mg via INTRAMUSCULAR
  Filled 2020-01-27: qty 5

## 2020-01-27 NOTE — MAU Note (Signed)
Patient reports contractions that started 2-3 mins apart but have since spaced now, though she is still feeling them.  Denies LOF/VB.  Endorses + FM.

## 2020-01-27 NOTE — MAU Provider Note (Signed)
History     CSN: [id number]  Arrival date and time: 01/27/20 1652   Event Date/Time   First Provider Initiated Contact with Patient 01/27/20 1733      Chief Complaint  Patient presents with  . Contractions   HPI [facility] 30 y.o. [redacted]w[redacted]d  Comes to MAU today with low back ache thinking she is having contractions as these today are more regular and the back pain is new.  She left work and came for evaluation.  Works at [employer].  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  OB History    Gravida  1   Para  0   Term  0   Preterm  0   AB  0   Living        SAB  0   IAB  0   Ectopic  0   Multiple      Live Births              Past Medical History:  Diagnosis Date  . Allergy   . Anxiety   . Asthma    physical induced asthma   . Eating disorder   . Frequent headaches   . Gastritis   . GERD (gastroesophageal reflux disease)   . Hiatal hernia   . Hypothyroid   . IBS (irritable bowel syndrome)   . Kidney stones   . Obsessive compulsive disorder   . Ovarian cyst   . Sjogren's disease ([facility])   . Sjogren's syndrome ([facility])   . Thyroid disease   . UTI (urinary tract infection)   . Vaginal Pap smear, abnormal   . Vision abnormalities     Past Surgical History:  Procedure Laterality Date  . ADENOIDECTOMY    . CERVIX LESION DESTRUCTION    . COLONOSCOPY    . CYSTOSCOPY    . KIDNEY STONE SURGERY    . KNEE ARTHROSCOPY    . KNEE SURGERY    . TONSILLECTOMY      Family History  Problem Relation Age of Onset  . Thyroid disease Mother   . Hypertension Father   . Diabetes Father   . Thyroid disease Sister   . Cancer Paternal Grandmother        breast  . Diabetes Paternal Grandfather   . Heart disease Paternal Grandfather   . Depression Mother   . Atrial fibrillation Mother   . Colon polyps Father   . Arthritis Maternal Grandmother   . Heart disease Maternal Grandmother   . Hypertension Paternal Grandmother   . Hypertension Paternal Grandfather   . Diabetes Mellitus II Sister    . Hypothyroidism Sister   . Colon cancer Neg Hx   . Esophageal cancer Neg Hx   . Rectal cancer Neg Hx   . Stomach cancer Neg Hx     Social History   Tobacco Use  . Smoking status: Former Smoker    Quit date: 11/09/2011    Years since quitting: 8.2  . Smokeless tobacco: Never Used  Vaping Use  . Vaping Use: Never used  Substance Use Topics  . Alcohol use: Not Currently    Comment: occasionally   . Drug use: Never    Allergies:  Allergies  Allergen Reactions  . Soy Allergy Anaphylaxis    Large amounts cause anaphylaxis Small amounts cause upset stomach  . Medrol [Methylprednisolone] Hives  . Montelukast Sodium Other (See Comments)    Bad dreams, nightmares    Medications Prior to Admission  Medication Sig Dispense Refill Last Dose  .  cetirizine (ZYRTEC) 10 MG tablet Take 10 mg by mouth daily.   01/26/2020 at Unknown time  . cholecalciferol (VITAMIN D3) 25 MCG (1000 UT) tablet Take 1,000 Units by mouth daily.   Past Week at Unknown time  . Cyanocobalamin (VITAMIN B 12 PO) Place 1 tablet under the tongue daily.    01/27/2020 at Unknown time  . docusate sodium (COLACE) 100 MG capsule Take 100 mg by mouth 2 (two) times daily.   01/26/2020 at Unknown time  . esomeprazole (NEXIUM) 20 MG packet Take 20 mg by mouth daily before breakfast. Once daily   01/27/2020 at Unknown time  . FLUoxetine HCl (PROZAC PO) Take 60 mg by mouth daily.   01/27/2020 at Unknown time  . levothyroxine (SYNTHROID) 75 MCG tablet TAKE 1 TABLET BY MOUTH ONCE DAILY BEFORE BREAKFAST 90 tablet 1 01/27/2020 at Unknown time  . polyethylene glycol (MIRALAX / GLYCOLAX) 17 g packet Take 17 g by mouth daily.   Past Week at Unknown time  . Prenatal Vit-Fe Fumarate-FA (PRENATAL MULTIVITAMIN) TABS tablet Take 1 tablet by mouth daily at 12 noon.   01/27/2020 at Unknown time  . levothyroxine (SYNTHROID) 75 MCG tablet Take 75 mcg by mouth daily before breakfast.     . Prenatal Vit-Fe Fumarate-FA (PRENATAL VITAMINS PO) Take by mouth.        Review of Systems  Constitutional: Negative for fever.  Respiratory: Negative for cough and shortness of breath.   Gastrointestinal: Positive for abdominal pain. Negative for nausea and vomiting.  Genitourinary: Negative for dysuria, vaginal bleeding and vaginal discharge.  Musculoskeletal: Positive for back pain.   Physical Exam   Blood pressure 125/78, pulse 74, temperature 98.1 F (36.7 C), resp. rate 16, weight 74.1 kg, last menstrual period 06/02/2019.  Physical Exam Vitals and nursing note reviewed.  Constitutional:      Appearance: She is well-developed and well-nourished.  HENT:     Head: Normocephalic.  Eyes:     Extraocular Movements: EOM normal.  Abdominal:     Palpations: Abdomen is soft.     Tenderness: There is no abdominal tenderness. There is no guarding or rebound.  Genitourinary:    Comments: Cervix - 0.5 cm and thick, through lower uterine segment feels as if it is vertex - suture line traced Musculoskeletal:        General: Normal range of motion.     Cervical back: Neck supple.  Skin:    General: Skin is warm and dry.  Neurological:     Mental Status: She is alert and oriented to person, place, and time.  Psychiatric:        Mood and Affect: Mood and affect normal.     MAU Course  Procedures Results for orders placed or performed during the hospital encounter of 01/27/20 (from the past 24 hour(s))  Urinalysis, Routine w reflex microscopic Urine, Clean Catch     Status: Abnormal   Collection Time: 01/27/20  5:10 PM  Result Value Ref Range   Color, Urine COLORLESS (A) YELLOW   APPearance CLEAR CLEAR   Specific Gravity, Urine 1.001 (L) 1.005 - 1.030   pH 7.0 5.0 - 8.0   Glucose, UA NEGATIVE NEGATIVE mg/dL   Hgb urine dipstick SMALL (A) NEGATIVE   Bilirubin Urine NEGATIVE NEGATIVE   Ketones, ur NEGATIVE NEGATIVE mg/dL   Protein, ur NEGATIVE NEGATIVE mg/dL   Nitrite NEGATIVE NEGATIVE   Leukocytes,Ua NEGATIVE NEGATIVE   RBC / HPF 6-10 0 - 5  RBC/hpf   WBC,  UA 0-5 0 - 5 WBC/hpf   Bacteria, UA RARE (A) NONE SEEN    MDM IVF 1000cc LR and will give procardia Will need to take a few days off from work Urine culture pending 1910 - 3 doses of procardia in - back pain is much better, still having contractions but they are not as strong and do not last as long.  Has a headache from the procardia - will give Tylenol PO and observe  2015 - Contractions are not stopped still 4-5 minutes apart.  Back pain has resolved but she can feel the contractions.  Cervical exam - some change - tight 1 cm Baby -1/-2 station.  Reviewed plan of care with Dr. Elly Modena.  Called report to Dr. Alwyn Pea with recommendation to admit for further observation.  Discussed Betamethasone with client.  Had allergy to Medrol (Mehtylprednisolone) listed on her chart - discussed with client - in 2015 she ate something (unknown still what caused the reaction) went to ER with anaphylaxis and was given epi pen and then followed with Medrol.  After going home from the hospital, developed rash while taking Medrol.  Initially thought she had an allergy to Medrol but on discussion, the rash could have been follow up reaction to food that caused the anaphylaxis initially.  Advised that usually predinsoe is used to treat allergic reaction and does not usually cause allergic reaction.  Reviewed purpose of betamethasone to help lung development for a baby that may be born prematurely.  Patient elects to try betamethasone with observation for allergic reaction.  Assessment and Plan  Threatened preterm labor at [redacted]w[redacted]d - unable for contractions to be stopped with IVF and Procardia x 3 doses  Plan Admit for observation - Report given to Irene Shipper, CNM  Treyden Hakim L Jan Olano 01/27/2020, 5:38 PM

## 2020-01-27 NOTE — H&P (Signed)
Michelle Waller is a 30 y.o. female presenting for preterm contractions with back pain.. Denies LOF, bloody show.  FM+ Per NP in MAU small amount of cervical change noted with patient still contracting.  Given Procardia x 3 doses and IVF with present contractions Hx of hypothyroidism and abnormal pap LGSIL   OB History    Gravida  1   Para  0   Term  0   Preterm  0   AB  0   Living        SAB  0   IAB  0   Ectopic  0   Multiple      Live Births             Past Medical History:  Diagnosis Date  . Allergy   . Anxiety   . Asthma    physical induced asthma   . Eating disorder   . Frequent headaches   . Gastritis   . GERD (gastroesophageal reflux disease)   . Hiatal hernia   . Hypothyroid   . IBS (irritable bowel syndrome)   . Kidney stones   . Obsessive compulsive disorder   . Ovarian cyst   . Sjogren's disease (HCC)   . Sjogren's syndrome (HCC)   . Thyroid disease   . UTI (urinary tract infection)   . Vaginal Pap smear, abnormal   . Vision abnormalities    Past Surgical History:  Procedure Laterality Date  . ADENOIDECTOMY    . CERVIX LESION DESTRUCTION    . COLONOSCOPY    . CYSTOSCOPY    . KIDNEY STONE SURGERY    . KNEE ARTHROSCOPY    . KNEE SURGERY    . TONSILLECTOMY     Family History: family history includes Arthritis in her maternal grandmother; Atrial fibrillation in her mother; Cancer in her paternal grandmother; Colon polyps in her father; Depression in her mother; Diabetes in her father and paternal grandfather; Diabetes Mellitus II in her sister; Heart disease in her maternal grandmother and paternal grandfather; Hypertension in her father, paternal grandfather, and paternal grandmother; Hypothyroidism in her sister; Thyroid disease in her mother and sister. Social History:  reports that she quit smoking about 8 years ago. She has never used smokeless tobacco. She reports previous alcohol use. She reports that she does not use drugs.      Maternal Diabetes: No Maternal Ultrasounds/Referrals: Normal Fetal Ultrasounds or other Referrals:  None Maternal Substance Abuse:  No Significant Maternal Medications:  Meds include: Syntroid Significant Maternal Lab Results: RPR positive and confirmatory testing negative Other Comments:  None  Review of Systems History Dilation: 1 Station: -1 Exam by:: Nolene Bernheim, NP Blood pressure 123/65, pulse 74, temperature 98.1 F (36.7 C), resp. rate 16, weight 74.1 kg, last menstrual period 06/02/2019.  FHTs 130 variability present, accels noted Contractions 3-5 lasting 50 sec  Physical Exam Vitals and nursing note reviewed.  Constitutional:      Appearance: Normal appearance.  HENT:     Head: Normocephalic.     Mouth/Throat:     Mouth: Mucous membranes are moist.  Cardiovascular:     Rate and Rhythm: Normal rate and regular rhythm.     Pulses: Normal pulses.  Pulmonary:     Effort: Pulmonary effort is normal.  Abdominal:     Comments: gravid  Musculoskeletal:        General: Normal range of motion.     Cervical back: Normal range of motion and neck supple.  Skin:  General: Skin is warm and dry.  Neurological:     Mental Status: She is alert and oriented to person, place, and time.  Psychiatric:        Mood and Affect: Mood normal.     Prenatal labs: ABO, Rh:   B positive  Antibody:  Neg Rubella:  Imm RPR:   positive with confirmatory test negative HBsAg:   Neg HIV:   NR GBS:   pending  Assessment/Plan: 30 yo G1P0 at 34.1 weeks IUP with preterm contractions Cat one strip Admit for observation Betamethasone x 2 Cont Monitoring GBS  [place] Newcomer Brandice Busser 01/27/2020, 9:02 PM

## 2020-01-28 LAB — TYPE AND SCREEN
ABO/RH(D): B POS
Antibody Screen: NEGATIVE

## 2020-01-28 NOTE — Discharge Summary (Signed)
Preterm Contractions OB Discharge Summary  Patient Name: Michelle Waller DOB: January 05, 1991 MRN: [id number]  Date of admission: 01/27/2020 Intrauterine pregnancy: [redacted]w[redacted]d   Admitting diagnosis: Preterm uterine contractions in third trimester, antepartum [O47.03] Secondary diagnosis: None  Date of discharge: 01/28/2020    Discharge diagnosis: Preterm contractions     Prenatal history: G1P0000   EDC : 03/08/2020, by Last Menstrual Period  Prenatal care at [facility]  Primary provider : [place] Prenatal course complicated by preterm contractions   Prenatal Labs: ABO, Rh: --/--/B POS (01/04 2309)  Antibody: NEG (01/04 2309)                                  Hospital course:  Preterm contractions. Cervix from closed to 1 cm. Procardia x 3 doses. Procardia caused severe HA. No meds sent home with pt. Occasional contraction now. FHR reassuring. Reports feeling much better. Betamethasone x 2 doses m(01/27/20 @ 2115 and 01/28/20@2115 ). Labor precautions and [place] reviewed.  Pt to follow up with Dr Alwyn Pea on Wednesday 02/04/20 or prn                                                         Labs: Lab Results  Component Value Date   WBC 7.1 02/03/2019   HGB 13.1 02/03/2019   HCT 38.5 02/03/2019   MCV 88.3 02/03/2019   PLT 213 02/03/2019   CMP Latest Ref Rng & Units 02/03/2019  Glucose 70 - 99 mg/dL 112(H)  BUN 6 - 20 mg/dL 14  Creatinine 0.44 - 1.00 mg/dL 0.71  Sodium 135 - 145 mmol/L 140  Potassium 3.5 - 5.1 mmol/L 3.4(L)  Chloride 98 - 111 mmol/L 104  CO2 22 - 32 mmol/L 27  Calcium 8.9 - 10.3 mg/dL 9.2  Total Protein 6.5 - 8.1 g/dL -  Total Bilirubin 0.3 - 1.2 mg/dL -  Alkaline Phos 38 - 126 U/L -  AST 15 - 41 U/L -  ALT 0 - 44 U/L -    Physical Exam @ time of discharge:  Vitals:   01/28/20 1544 01/28/20 1545 01/28/20 1931 01/28/20 2120  BP: 126/80  (!) 131/57 131/73  Pulse: 81  70 80  Resp: 18  18 18   Temp: 98.2 F (36.8 C)  98.5 F (36.9 C) 98.3 F (36.8 C)  TempSrc: Oral  Oral  Oral  SpO2: 100% 100% 100% 100%  Weight:       Discharge Medications:  Allergies as of 01/28/2020      Reactions   Soy Allergy Anaphylaxis   Large amounts cause anaphylaxis Small amounts cause upset stomach   Medrol [methylprednisolone] Hives   Montelukast Sodium Other (See Comments)   Bad dreams, nightmares      Medication List    TAKE these medications   cetirizine 10 MG tablet Commonly known as: ZYRTEC Take 10 mg by mouth daily.   cholecalciferol 25 MCG (1000 UNIT) tablet Commonly known as: VITAMIN D3 Take 1,000 Units by mouth daily.   docusate sodium 100 MG capsule Commonly known as: COLACE Take 100 mg by mouth 2 (two) times daily.   levothyroxine 75 MCG tablet Commonly known as: SYNTHROID Take 75 mcg by mouth daily before breakfast.   levothyroxine 75 MCG  tablet Commonly known as: SYNTHROID TAKE 1 TABLET BY MOUTH ONCE DAILY BEFORE BREAKFAST   NexIUM 20 MG packet Generic drug: esomeprazole Take 20 mg by mouth daily before breakfast. Once daily   polyethylene glycol 17 g packet Commonly known as: MIRALAX / GLYCOLAX Take 17 g by mouth daily.   prenatal multivitamin Tabs tablet Take 1 tablet by mouth daily at 12 noon.   PRENATAL VITAMINS PO Take by mouth.   PROZAC PO Take 40 mg by mouth daily.   VITAMIN B 12 PO Place 1 tablet under the tongue daily.     GBS and UC pending Betamethasone X 2 completed Diet: routine diet, encouraged hydration Activity: Advance as tolerated. Follow up:1 week, ultrasound to verify position  Signed: Carollee Leitz MSN, CNM 01/28/2020, 9:23 PM

## 2020-01-28 NOTE — Progress Notes (Signed)
S:  RN called to report patient feeling more pressure with contractions. Rates contraction pain a 4-5 on 0-10scale.   Denies bloody show or LOF.  FM+.  Headache decreased to a 2 after oxycodone.   O:  Vitals with BMI 01/28/2020 01/27/2020 01/27/2020  Height - - -  Weight - - -  BMI - - -  Systolic 115 130 233  Diastolic 55 67 65  Pulse 70 90 -   FHT 145 variability present, accels noted Contractions 3-5 lasting 40 sec SVE loose `1/thick/high vertex A:G1P0 at 34.2 IUP preterm contractions Reassuring fetal status P: Post Betamethasone dose #1 at 2115 Repeat at today at 2115 GBS and UC pending Will consider Procardia  MD aware of POC.

## 2020-01-28 NOTE — Progress Notes (Addendum)
Subjective:    Michelle Waller was resting comfortably in bed with husband at her side. She states the contractions have gotten better over the last hour and she is not feeling as much. Baby has been in breech position. She thinks the baby has "flipped". She reports lots of movement over the last 2 days. Leopolds done. Unsure of fetal position.   Objective:    VS: BP (!) 115/55 (BP Location: Right Arm)   Pulse 70   Temp 98.4 F (36.9 C) (Oral)   Resp 16   Wt 74.1 kg   LMP 06/02/2019   SpO2 100%   BMI 29.89 kg/m  FHR : baseline 130 / variability moderate / accelerations present / negative decelerations Toco: contractions every 2-5 minutes / mild Membranes: Intact Dilation: 1 Effacement (%): Thick Station: -2 Presentation: Vertex Exam by:: Steward Drone, CNM  Assessment/Plan:   30 y.o. G1P0000 [redacted]w[redacted]d admitted for observation for preterm contractions.   Rest  Hydration Hold procardia d/t HA Betamethasone x 2 ( 1st dose 01/27/20 @ 2115) GBS and UC pending Ultrasound to verify position if contractions continue.   Gerhard Munch Holshouser MSN, CNM 01/28/2020 8:00 AM  Attestation of Attending Supervision of Advanced Practitioner (CNM/NP): Evaluation and management procedures were performed by the Advanced Practitioner under my supervision and collaboration.  I have reviewed the Advanced Practitioner's note and chart, and I agree with the management and plan.I saw and examined patient at bedside and agree with above findings, assessment and plan as outlined above by Carollee Leitz MSN, CNM.  Dr. Sallye Ober.  01/28/2020.

## 2020-01-28 NOTE — Progress Notes (Signed)
Patient given discharge instructions including when to call the doctor and signs of preterm labor.   Instructed to follow up with provider in one week. Patient had no further questions. Discharged home with spouse.    Quincy Simmonds, RN

## 2020-01-29 LAB — CULTURE, BETA STREP (GROUP B ONLY)

## 2020-01-29 LAB — CULTURE, OB URINE: Culture: <10000 — AB

## 2020-01-31 ENCOUNTER — Other Ambulatory Visit: Payer: Self-pay

## 2020-01-31 ENCOUNTER — Encounter (HOSPITAL_COMMUNITY): Payer: Self-pay | Admitting: Obstetrics and Gynecology

## 2020-01-31 ENCOUNTER — Inpatient Hospital Stay (HOSPITAL_COMMUNITY)
Admission: AD | Admit: 2020-01-31 | Discharge: 2020-02-01 | Disposition: A | Discharge status: Home / Self Care | Payer: No Typology Code available for payment source | Source: Ambulatory Visit | Attending: Obstetrics and Gynecology | Admitting: Obstetrics and Gynecology

## 2020-01-31 DIAGNOSIS — O99891 Other specified diseases and conditions complicating pregnancy: Secondary | ICD-10-CM

## 2020-01-31 DIAGNOSIS — K219 Gastro-esophageal reflux disease without esophagitis: Secondary | ICD-10-CM | POA: Insufficient documentation

## 2020-01-31 DIAGNOSIS — Z87891 Personal history of nicotine dependence: Secondary | ICD-10-CM | POA: Insufficient documentation

## 2020-01-31 DIAGNOSIS — O26893 Other specified pregnancy related conditions, third trimester: Secondary | ICD-10-CM | POA: Diagnosis not present

## 2020-01-31 DIAGNOSIS — R1013 Epigastric pain: Secondary | ICD-10-CM | POA: Insufficient documentation

## 2020-01-31 DIAGNOSIS — R079 Chest pain, unspecified: Secondary | ICD-10-CM

## 2020-01-31 DIAGNOSIS — Z3689 Encounter for other specified antenatal screening: Secondary | ICD-10-CM

## 2020-01-31 DIAGNOSIS — Z3A34 34 weeks gestation of pregnancy: Secondary | ICD-10-CM | POA: Diagnosis not present

## 2020-01-31 DIAGNOSIS — O329XX0 Maternal care for malpresentation of fetus, unspecified, not applicable or unspecified: Secondary | ICD-10-CM | POA: Insufficient documentation

## 2020-01-31 DIAGNOSIS — O99613 Diseases of the digestive system complicating pregnancy, third trimester: Secondary | ICD-10-CM | POA: Insufficient documentation

## 2020-01-31 LAB — CBC WITH DIFFERENTIAL/PLATELET
Abs Immature Granulocytes: 0.11 10*3/uL — ABNORMAL HIGH (ref 0.00–0.07)
Basophils Absolute: 0 10*3/uL (ref 0.0–0.1)
Basophils Relative: 0 %
Eosinophils Absolute: 0.1 10*3/uL (ref 0.0–0.5)
Eosinophils Relative: 1 %
HCT: 34.7 % — ABNORMAL LOW (ref 36.0–46.0)
Hemoglobin: 11.7 g/dL — ABNORMAL LOW (ref 12.0–15.0)
Immature Granulocytes: 2 %
Lymphocytes Relative: 17 %
Lymphs Abs: 1.1 10*3/uL (ref 0.7–4.0)
MCH: 30.7 pg (ref 26.0–34.0)
MCHC: 33.7 g/dL (ref 30.0–36.0)
MCV: 91.1 fL (ref 80.0–100.0)
Monocytes Absolute: 0.5 10*3/uL (ref 0.1–1.0)
Monocytes Relative: 7 %
Neutro Abs: 4.8 10*3/uL (ref 1.7–7.7)
Neutrophils Relative %: 73 %
Platelets: 191 10*3/uL (ref 150–400)
RBC: 3.81 MIL/uL — ABNORMAL LOW (ref 3.87–5.11)
RDW: 12 % (ref 11.5–15.5)
WBC: 6.6 10*3/uL (ref 4.0–10.5)
nRBC: 0 % (ref 0.0–0.2)

## 2020-01-31 LAB — COMPREHENSIVE METABOLIC PANEL
ALT: 16 U/L (ref 0–44)
AST: 18 U/L (ref 15–41)
Albumin: 2.8 g/dL — ABNORMAL LOW (ref 3.5–5.0)
Alkaline Phosphatase: 57 U/L (ref 38–126)
Anion gap: 11 (ref 5–15)
BUN: 6 mg/dL (ref 6–20)
CO2: 20 mmol/L — ABNORMAL LOW (ref 22–32)
Calcium: 8.8 mg/dL — ABNORMAL LOW (ref 8.9–10.3)
Chloride: 104 mmol/L (ref 98–111)
Creatinine, Ser: 0.53 mg/dL (ref 0.44–1.00)
GFR, Estimated: >60 mL/min (ref >60)
Glucose, Bld: 92 mg/dL (ref 70–99)
Potassium: 3.2 mmol/L — ABNORMAL LOW (ref 3.5–5.1)
Sodium: 135 mmol/L (ref 135–145)
Total Bilirubin: 0.3 mg/dL (ref 0.3–1.2)
Total Protein: 6.2 g/dL — ABNORMAL LOW (ref 6.5–8.1)

## 2020-01-31 NOTE — MAU Provider Note (Signed)
History     CSN: [id number]  Arrival date and time: 01/31/20 2135   Event Date/Time   First Provider Initiated Contact with Patient 01/31/20 2351      Chief Complaint  Patient presents with  . Chest Pain  . Shortness of Breath   Michelle Waller is a 30 y.o. G1P0 at [redacted]w[redacted]d who receives care at [facility].  She presents today for Chest Pain and Shortness of Breath.  She states she thinks it is coming from her diaphragm and hiatal hernia.  She describes it as "my chest and lungs are contracting..my uterus is contracting."  She states it is "like a whole body contraction" and states it is becoming more frequently within the last week after presenting 2 weeks ago. She states it feels like "something is sitting on my chest" and reports it can lasts anywhere from 5-30 minutes.  She has not identified any relieving or aggravating factors.  She reports it is occurring every few hours and notices it occurs closer to when she eats.  She reports sleeping straight up and has noted an improvement in her acid reflex.  She states her chest aches tonight from all the contracting.  She rates her pain a 3/10.   She endorses fetal movement and denies vaginal bleeding and abnormal vaginal discharge.    OB History    Gravida  1   Para  0   Term  0   Preterm  0   AB  0   Living        SAB  0   IAB  0   Ectopic  0   Multiple      Live Births              Past Medical History:  Diagnosis Date  . Allergy   . Anxiety   . Asthma    physical induced asthma   . Eating disorder   . Frequent headaches   . Gastritis   . GERD (gastroesophageal reflux disease)   . Hiatal hernia   . Hypothyroid   . IBS (irritable bowel syndrome)   . Kidney stones   . Obsessive compulsive disorder   . Ovarian cyst   . Sjogren's disease ([place])   . Sjogren's syndrome ([place])   . Thyroid disease   . UTI (urinary tract infection)   . Vaginal Pap smear, abnormal   . Vision abnormalities     Past Surgical History:   Procedure Laterality Date  . ADENOIDECTOMY    . CERVIX LESION DESTRUCTION    . COLONOSCOPY    . CYSTOSCOPY    . KIDNEY STONE SURGERY    . KNEE ARTHROSCOPY    . KNEE SURGERY      Family History  Problem Relation Age of Onset  . Thyroid disease Mother   . Hypertension Father   . Diabetes Father   . Thyroid disease Sister   . Cancer Paternal Grandmother        breast  . Diabetes Paternal Grandfather   . Heart disease Paternal Grandfather   . Depression Mother   . Atrial fibrillation Mother   . Colon polyps Father   . Arthritis Maternal Grandmother   . Heart disease Maternal Grandmother   . Hypertension Paternal Grandmother   . Hypertension Paternal Grandfather   . Diabetes Mellitus II Sister   . Hypothyroidism Sister   . Colon cancer Neg Hx   . Esophageal cancer Neg Hx   . Rectal cancer Neg Hx   .  Stomach cancer Neg Hx     Social History   Tobacco Use  . Smoking status: Former Smoker    Quit date: 11/09/2011    Years since quitting: 8.2  . Smokeless tobacco: Never Used  Vaping Use  . Vaping Use: Never used  Substance Use Topics  . Alcohol use: Not Currently    Comment: occasionally   . Drug use: Never    Allergies:  Allergies  Allergen Reactions  . Soy Allergy Anaphylaxis    Large amounts cause anaphylaxis Small amounts cause upset stomach  . Medrol [Methylprednisolone] Hives  . Montelukast Sodium Other (See Comments)    Bad dreams, nightmares    Medications Prior to Admission  Medication Sig Dispense Refill Last Dose  . cetirizine (ZYRTEC) 10 MG tablet Take 10 mg by mouth daily.   01/30/2020 at Unknown time  . cholecalciferol (VITAMIN D3) 25 MCG (1000 UT) tablet Take 1,000 Units by mouth daily.   01/30/2020 at Unknown time  . Cyanocobalamin (VITAMIN B 12 PO) Place 1 tablet under the tongue daily.    01/31/2020 at Unknown time  . docusate sodium (COLACE) 100 MG capsule Take 100 mg by mouth 2 (two) times daily.   01/30/2020 at Unknown time  . esomeprazole  (NEXIUM) 20 MG packet Take 20 mg by mouth daily before breakfast. Once daily   01/31/2020 at Unknown time  . ferrous sulfate 325 (65 FE) MG tablet Take 325 mg by mouth daily with breakfast.   01/31/2020 at Unknown time  . FLUoxetine HCl (PROZAC PO) Take 40 mg by mouth daily.   01/31/2020 at Unknown time  . levothyroxine (SYNTHROID) 75 MCG tablet TAKE 1 TABLET BY MOUTH ONCE DAILY BEFORE BREAKFAST 90 tablet 1 01/31/2020 at Unknown time  . polyethylene glycol (MIRALAX / GLYCOLAX) 17 g packet Take 17 g by mouth daily.   01/30/2020 at Unknown time  . Prenatal Vit-Fe Fumarate-FA (PRENATAL MULTIVITAMIN) TABS tablet Take 1 tablet by mouth daily at 12 noon.   01/31/2020 at Unknown time  . levothyroxine (SYNTHROID) 75 MCG tablet Take 75 mcg by mouth daily before breakfast.     . Prenatal Vit-Fe Fumarate-FA (PRENATAL VITAMINS PO) Take by mouth.       Review of Systems  Constitutional: Negative for chills and fever.  Eyes: Negative for visual disturbance.  Respiratory: Positive for chest tightness and shortness of breath. Negative for cough.   Cardiovascular: Positive for chest pain.  Gastrointestinal: Positive for abdominal pain and nausea. Negative for vomiting.  Genitourinary: Negative for difficulty urinating, dysuria, vaginal bleeding and vaginal discharge.  Neurological: Positive for headaches. Negative for dizziness and light-headedness.   Physical Exam   Blood pressure 114/75, pulse 80, temperature 98.4 F (36.9 C), temperature source Oral, resp. rate 16, height 5\' 2"  (1.575 m), weight 71.9 kg, last menstrual period 06/02/2019, SpO2 100 %.  Physical Exam Pulmonary:     Effort: Pulmonary effort is normal.     Breath sounds: Normal breath sounds. No decreased breath sounds.  Abdominal:     Tenderness: There is abdominal tenderness in the epigastric area.       Fetal Assessment 135 bpm, Mod Var, -Decels, +Accels Toco: No contractions graphed  MAU Course   Results for orders placed or performed  during the hospital encounter of 01/31/20 (from the past 24 hour(s))  CBC with Differential/Platelet     Status: Abnormal   Collection Time: 01/31/20 10:54 PM  Result Value Ref Range   WBC 6.6 4.0 - 10.5 K/uL  RBC 3.81 (L) 3.87 - 5.11 MIL/uL   Hemoglobin 11.7 (L) 12.0 - 15.0 g/dL   HCT 34.7 (L) 36.0 - 46.0 %   MCV 91.1 80.0 - 100.0 fL   MCH 30.7 26.0 - 34.0 pg   MCHC 33.7 30.0 - 36.0 g/dL   RDW 12.0 11.5 - 15.5 %   Platelets 191 150 - 400 K/uL   nRBC 0.0 0.0 - 0.2 %   Neutrophils Relative % 73 %   Neutro Abs 4.8 1.7 - 7.7 K/uL   Lymphocytes Relative 17 %   Lymphs Abs 1.1 0.7 - 4.0 K/uL   Monocytes Relative 7 %   Monocytes Absolute 0.5 0.1 - 1.0 K/uL   Eosinophils Relative 1 %   Eosinophils Absolute 0.1 0.0 - 0.5 K/uL   Basophils Relative 0 %   Basophils Absolute 0.0 0.0 - 0.1 K/uL   Immature Granulocytes 2 %   Abs Immature Granulocytes 0.11 (H) 0.00 - 0.07 K/uL  Comprehensive metabolic panel     Status: Abnormal   Collection Time: 01/31/20 10:54 PM  Result Value Ref Range   Sodium 135 135 - 145 mmol/L   Potassium 3.2 (L) 3.5 - 5.1 mmol/L   Chloride 104 98 - 111 mmol/L   CO2 20 (L) 22 - 32 mmol/L   Glucose, Bld 92 70 - 99 mg/dL   BUN 6 6 - 20 mg/dL   Creatinine, Ser 0.53 0.44 - 1.00 mg/dL   Calcium 8.8 (L) 8.9 - 10.3 mg/dL   Total Protein 6.2 (L) 6.5 - 8.1 g/dL   Albumin 2.8 (L) 3.5 - 5.0 g/dL   AST 18 15 - 41 U/L   ALT 16 0 - 44 U/L   Alkaline Phosphatase 57 38 - 126 U/L   Total Bilirubin 0.3 0.3 - 1.2 mg/dL   GFR, Estimated >60 >60 mL/min   Anion gap 11 5 - 15   No results found.  MDM PE Labs: CBC/D, CMP EFM EKG Assessment and Plan  30 year old G1P0  SIUP at 34.5weeks Cat I FT Epigastric Pain Fetal Malpresentation  -EKG and labs ordered prior to provider at bedside. -POC Reviewed. -Exam performed and findings discussed. -Patient informed that complaints likely from combination of issues including: *Hernia *Gerd *Fetal Presentation -Extensive  discussion on how these factors can all contribute and worsen complaint. -Patient expresses sentiment stating she had concluded these were causing worsening of symptoms.  -Informed that could perform further testing, but initial labs reveal no significant findings. -Patient offered and declines pain medication.  -Discussed continued mgmt of GERD and surgical intervention for hernia. -Patient verbalized understanding -Provider to review EKG with cardiologist.    Maryann Conners MSN, CNM 01/31/2020, 11:51 PM   Reassessment (12:16 AM) Dr. Neena Rhymes consulted and reports Normal EKG.  Does not advise further interventions or testing at this time.  -Patient updated. -Instructed to follow up with primary ob as scheduled. Next appt on Wednesday Jan 12. -Discussed usage of Tums for reflux after initial dosing of prescribed PPI. -Encouraged to call or return to MAU if symptoms worsen or with the onset of new symptoms. -Discharged to home in stable condition.  Maryann Conners MSN, CNM Advanced Practice Provider, [facility]

## 2020-01-31 NOTE — MAU Note (Signed)
..  Michelle Waller is a 30 y.o. at [redacted]w[redacted]d here in MAU reporting: Shortness of breath and chest pain. Patient reports she has had this pain for two weeks on and off. She explains that the pain is achy and as if "someone is sitting on me," she describes it as being more in her diaphragm and is aggravated by braxton hicks and it sometimes radiates to her shoulders or back. Patient reports she has a hiatal hernia and not sure if this is related to it. Patient has some irregular contractions. Reports a small gush of fluid after a contraction around 1830 but nothing since then.  +FM  Pain score: 4/10 Vitals:   01/31/20 2211  BP: 114/75  Pulse: 80  Resp: 16  Temp: 98.4 F (36.9 C)  SpO2: 100%     FHT: 145

## 2020-01-31 NOTE — MAU Provider Note (Incomplete)
History     CSN: [id number]  Arrival date and time: 01/31/20 2135   Event Date/Time   First Provider Initiated Contact with Patient 01/31/20 2351      Chief Complaint  Patient presents with  . Chest Pain  . Shortness of Breath   Michelle Waller is a 30 y.o. G1P0 at [redacted]w[redacted]d who receives care at ***.  She presents today for Chest Pain and Shortness of Breath.  She states she thinks it is coming from her diaphragm and hiatal hernia.  She describes it as "my chest and lungs are contracting..my uterus is contracting."  She states it is "like a whole body contraction" and states it is becoming more frequently within the last week after presenting 2 weeks ago. She states it feels like "something is sitting on my chest" and reports it can lasts anywhere from 5-30 minutes.  She has not identified any relieving or aggravating factors.  She reports it is occurring every few hours and notices it occurs closer to when she eats.  She reports sleeping straight up and has noted an improvement in her acid reflex.  She states her chest aches tonight.  She endorses fetal movement and denies vaginal bleeding and abnormal vaginal discharge.    OB History    Gravida  1   Para  0   Term  0   Preterm  0   AB  0   Living        SAB  0   IAB  0   Ectopic  0   Multiple      Live Births              Past Medical History:  Diagnosis Date  . Allergy   . Anxiety   . Asthma    physical induced asthma   . Eating disorder   . Frequent headaches   . Gastritis   . GERD (gastroesophageal reflux disease)   . Hiatal hernia   . Hypothyroid   . IBS (irritable bowel syndrome)   . Kidney stones   . Obsessive compulsive disorder   . Ovarian cyst   . Sjogren's disease ([place])   . Sjogren's syndrome ([place])   . Thyroid disease   . UTI (urinary tract infection)   . Vaginal Pap smear, abnormal   . Vision abnormalities     Past Surgical History:  Procedure Laterality Date  . ADENOIDECTOMY    .  CERVIX LESION DESTRUCTION    . COLONOSCOPY    . CYSTOSCOPY    . KIDNEY STONE SURGERY    . KNEE ARTHROSCOPY    . KNEE SURGERY      Family History  Problem Relation Age of Onset  . Thyroid disease Mother   . Hypertension Father   . Diabetes Father   . Thyroid disease Sister   . Cancer Paternal Grandmother        breast  . Diabetes Paternal Grandfather   . Heart disease Paternal Grandfather   . Depression Mother   . Atrial fibrillation Mother   . Colon polyps Father   . Arthritis Maternal Grandmother   . Heart disease Maternal Grandmother   . Hypertension Paternal Grandmother   . Hypertension Paternal Grandfather   . Diabetes Mellitus II Sister   . Hypothyroidism Sister   . Colon cancer Neg Hx   . Esophageal cancer Neg Hx   . Rectal cancer Neg Hx   . Stomach cancer Neg Hx     Social History  Tobacco Use  . Smoking status: Former Smoker    Quit date: 11/09/2011    Years since quitting: 8.2  . Smokeless tobacco: Never Used  Vaping Use  . Vaping Use: Never used  Substance Use Topics  . Alcohol use: Not Currently    Comment: occasionally   . Drug use: Never    Allergies:  Allergies  Allergen Reactions  . Soy Allergy Anaphylaxis    Large amounts cause anaphylaxis Small amounts cause upset stomach  . Medrol [Methylprednisolone] Hives  . Montelukast Sodium Other (See Comments)    Bad dreams, nightmares    Medications Prior to Admission  Medication Sig Dispense Refill Last Dose  . cetirizine (ZYRTEC) 10 MG tablet Take 10 mg by mouth daily.   01/30/2020 at Unknown time  . cholecalciferol (VITAMIN D3) 25 MCG (1000 UT) tablet Take 1,000 Units by mouth daily.   01/30/2020 at Unknown time  . Cyanocobalamin (VITAMIN B 12 PO) Place 1 tablet under the tongue daily.    01/31/2020 at Unknown time  . docusate sodium (COLACE) 100 MG capsule Take 100 mg by mouth 2 (two) times daily.   01/30/2020 at Unknown time  . esomeprazole (NEXIUM) 20 MG packet Take 20 mg by mouth daily before  breakfast. Once daily   01/31/2020 at Unknown time  . ferrous sulfate 325 (65 FE) MG tablet Take 325 mg by mouth daily with breakfast.   01/31/2020 at Unknown time  . FLUoxetine HCl (PROZAC PO) Take 40 mg by mouth daily.   01/31/2020 at Unknown time  . levothyroxine (SYNTHROID) 75 MCG tablet TAKE 1 TABLET BY MOUTH ONCE DAILY BEFORE BREAKFAST 90 tablet 1 01/31/2020 at Unknown time  . polyethylene glycol (MIRALAX / GLYCOLAX) 17 g packet Take 17 g by mouth daily.   01/30/2020 at Unknown time  . Prenatal Vit-Fe Fumarate-FA (PRENATAL MULTIVITAMIN) TABS tablet Take 1 tablet by mouth daily at 12 noon.   01/31/2020 at Unknown time  . levothyroxine (SYNTHROID) 75 MCG tablet Take 75 mcg by mouth daily before breakfast.     . Prenatal Vit-Fe Fumarate-FA (PRENATAL VITAMINS PO) Take by mouth.       Review of Systems  Eyes: Negative for visual disturbance.  Respiratory: Positive for chest tightness.   Cardiovascular: Positive for chest pain.  Gastrointestinal: Positive for abdominal pain.  Genitourinary: Negative for difficulty urinating, dysuria, vaginal bleeding and vaginal discharge.  Neurological: Positive for headaches. Negative for dizziness and light-headedness.   Physical Exam   Blood pressure 114/75, pulse 80, temperature 98.4 F (36.9 C), temperature source Oral, resp. rate 16, height 5\' 2"  (1.575 m), weight 71.9 kg, last menstrual period 06/02/2019, SpO2 100 %.  Physical Exam  Fetal Assessment *** bpm, Mod Var, -Decels, +Accels Toco:   MAU Course   Results for orders placed or performed during the hospital encounter of 01/31/20 (from the past 24 hour(s))  CBC with Differential/Platelet     Status: Abnormal   Collection Time: 01/31/20 10:54 PM  Result Value Ref Range   WBC 6.6 4.0 - 10.5 K/uL   RBC 3.81 (L) 3.87 - 5.11 MIL/uL   Hemoglobin 11.7 (L) 12.0 - 15.0 g/dL   HCT 34.7 (L) 36.0 - 46.0 %   MCV 91.1 80.0 - 100.0 fL   MCH 30.7 26.0 - 34.0 pg   MCHC 33.7 30.0 - 36.0 g/dL   RDW 12.0 11.5 -  15.5 %   Platelets 191 150 - 400 K/uL   nRBC 0.0 0.0 - 0.2 %  Neutrophils Relative % 73 %   Neutro Abs 4.8 1.7 - 7.7 K/uL   Lymphocytes Relative 17 %   Lymphs Abs 1.1 0.7 - 4.0 K/uL   Monocytes Relative 7 %   Monocytes Absolute 0.5 0.1 - 1.0 K/uL   Eosinophils Relative 1 %   Eosinophils Absolute 0.1 0.0 - 0.5 K/uL   Basophils Relative 0 %   Basophils Absolute 0.0 0.0 - 0.1 K/uL   Immature Granulocytes 2 %   Abs Immature Granulocytes 0.11 (H) 0.00 - 0.07 K/uL  Comprehensive metabolic panel     Status: Abnormal   Collection Time: 01/31/20 10:54 PM  Result Value Ref Range   Sodium 135 135 - 145 mmol/L   Potassium 3.2 (L) 3.5 - 5.1 mmol/L   Chloride 104 98 - 111 mmol/L   CO2 20 (L) 22 - 32 mmol/L   Glucose, Bld 92 70 - 99 mg/dL   BUN 6 6 - 20 mg/dL   Creatinine, Ser 0.53 0.44 - 1.00 mg/dL   Calcium 8.8 (L) 8.9 - 10.3 mg/dL   Total Protein 6.2 (L) 6.5 - 8.1 g/dL   Albumin 2.8 (L) 3.5 - 5.0 g/dL   AST 18 15 - 41 U/L   ALT 16 0 - 44 U/L   Alkaline Phosphatase 57 38 - 126 U/L   Total Bilirubin 0.3 0.3 - 1.2 mg/dL   GFR, Estimated >60 >60 mL/min   Anion gap 11 5 - 15   No results found.  MDM PE Labs: EFM  Assessment and Plan  *** G***P***  SIUP at ***weeks Cat *** FT   -Exam findings discussed. Maryann Conners MSN, CNM 01/31/2020, 11:51 PM

## 2020-02-01 NOTE — Discharge Instructions (Signed)
Hiatal Hernia  A hiatal hernia occurs when part of the stomach slides above the muscle that separates the abdomen from the chest (diaphragm). A person can be born with a hiatal hernia (congenital), or it may develop over time. In almost all cases of hiatal hernia, only the top part of the stomach pushes through the diaphragm. Many people have a hiatal hernia with no symptoms. The larger the hernia, the more likely it is that you will have symptoms. In some cases, a hiatal hernia allows stomach acid to flow back into the tube that carries food from your mouth to your stomach (esophagus). This may cause heartburn symptoms. Severe heartburn symptoms may mean that you have developed a condition called gastroesophageal reflux disease (GERD). What are the causes? This condition is caused by a weakness in the opening (hiatus) where the esophagus passes through the diaphragm to attach to the upper part of the stomach. A person may be born with a weakness in the hiatus, or a weakness can develop over time. What increases the risk? This condition is more likely to develop in:  Older people. Age is a major risk factor for a hiatal hernia, especially if you are over the age of 50.  Pregnant women.  People who are overweight.  People who have frequent constipation. What are the signs or symptoms? Symptoms of this condition usually develop in the form of GERD symptoms. Symptoms include:  Heartburn.  Belching.  Indigestion.  Trouble swallowing.  Coughing or wheezing.  Sore throat.  Hoarseness.  Chest pain.  Nausea and vomiting. How is this diagnosed? This condition may be diagnosed during testing for GERD. Tests that may be done include:  X-rays of your stomach or chest.  An upper gastrointestinal (GI) series. This is an X-ray exam of your GI tract that is taken after you swallow a chalky liquid that shows up clearly on the X-ray.  Endoscopy. This is a procedure to look into your stomach  using a thin, flexible tube that has a tiny camera and light on the end of it. How is this treated? This condition may be treated by:  Dietary and lifestyle changes to help reduce GERD symptoms.  Medicines. These may include: ? Over-the-counter antacids. ? Medicines that make your stomach empty more quickly. ? Medicines that block the production of stomach acid (H2 blockers). ? Stronger medicines to reduce stomach acid (proton pump inhibitors).  Surgery to repair the hernia, if other treatments are not helping. If you have no symptoms, you may not need treatment. Follow these instructions at home: Lifestyle and activity  Do not use any products that contain nicotine or tobacco, such as cigarettes and e-cigarettes. If you need help quitting, ask your health care provider.  Try to achieve and maintain a healthy body weight.  Avoid putting pressure on your abdomen. Anything that puts pressure on your abdomen increases the amount of acid that may be pushed up into your esophagus. ? Avoid bending over, especially after eating. ? Raise the head of your bed by putting blocks under the legs. This keeps your head and esophagus higher than your stomach. ? Do not wear tight clothing around your chest or stomach. ? Try not to strain when having a bowel movement, when urinating, or when lifting heavy objects. Eating and drinking  Avoid foods that can worsen GERD symptoms. These may include: ? Fatty foods, like fried foods. ? Citrus fruits, like oranges or lemon. ? Other foods and drinks that contain acid, like   orange juice or tomatoes. ? Spicy food. ? Chocolate.  Eat frequent small meals instead of three large meals a day. This helps prevent your stomach from getting too full. ? Eat slowly. ? Do not lie down right after eating. ? Do not eat 1-2 hours before bed.  Do not drink beverages with caffeine. These include cola, coffee, cocoa, and tea.  Do not drink alcohol. General  instructions  Take over-the-counter and prescription medicines only as told by your health care provider.  Keep all follow-up visits as told by your health care provider. This is important. Contact a health care provider if:  Your symptoms are not controlled with medicines or lifestyle changes.  You are having trouble swallowing.  You have coughing or wheezing that will not go away. Get help right away if:  Your pain is getting worse.  Your pain spreads to your arms, neck, jaw, teeth, or back.  You have shortness of breath.  You sweat for no reason.  You feel sick to your stomach (nauseous) or you vomit.  You vomit blood.  You have bright red blood in your stools.  You have black, tarry stools. This information is not intended to replace advice given to you by your health care provider. Make sure you discuss any questions you have with your health care provider. Document Revised: 12/22/2016 Document Reviewed: 08/14/2016 Elsevier Patient Education  2020 [employer]  

## 2020-02-06 ENCOUNTER — Other Ambulatory Visit: Payer: Self-pay | Admitting: Obstetrics and Gynecology

## 2020-02-06 ENCOUNTER — Encounter (HOSPITAL_COMMUNITY): Payer: Self-pay | Admitting: *Deleted

## 2020-02-06 ENCOUNTER — Telehealth (HOSPITAL_COMMUNITY): Payer: Self-pay | Admitting: *Deleted

## 2020-02-06 NOTE — Telephone Encounter (Signed)
Preadmission screen  

## 2020-02-10 ENCOUNTER — Encounter: Payer: Self-pay | Admitting: Internal Medicine

## 2020-02-13 ENCOUNTER — Other Ambulatory Visit (HOSPITAL_COMMUNITY): Payer: No Typology Code available for payment source

## 2020-02-15 ENCOUNTER — Inpatient Hospital Stay (HOSPITAL_COMMUNITY): Payer: No Typology Code available for payment source

## 2020-02-18 ENCOUNTER — Inpatient Hospital Stay (HOSPITAL_COMMUNITY): Payer: No Typology Code available for payment source | Admitting: Anesthesiology

## 2020-02-18 ENCOUNTER — Inpatient Hospital Stay (HOSPITAL_COMMUNITY)
Admission: AD | Admit: 2020-02-18 | Discharge: 2020-02-21 | DRG: 807 | Disposition: A | Discharge status: Home / Self Care | Payer: No Typology Code available for payment source | Attending: Obstetrics and Gynecology | Admitting: Obstetrics and Gynecology

## 2020-02-18 ENCOUNTER — Other Ambulatory Visit: Payer: Self-pay | Admitting: Advanced Practice Midwife

## 2020-02-18 ENCOUNTER — Other Ambulatory Visit: Payer: Self-pay | Admitting: Obstetrics and Gynecology

## 2020-02-18 ENCOUNTER — Other Ambulatory Visit: Payer: Self-pay

## 2020-02-18 ENCOUNTER — Encounter (HOSPITAL_COMMUNITY): Payer: Self-pay | Admitting: Obstetrics and Gynecology

## 2020-02-18 DIAGNOSIS — F32A Depression, unspecified: Secondary | ICD-10-CM | POA: Diagnosis present

## 2020-02-18 DIAGNOSIS — O99344 Other mental disorders complicating childbirth: Secondary | ICD-10-CM | POA: Diagnosis present

## 2020-02-18 DIAGNOSIS — M3500 Sicca syndrome, unspecified: Secondary | ICD-10-CM | POA: Diagnosis present

## 2020-02-18 DIAGNOSIS — O99893 Other specified diseases and conditions complicating puerperium: Secondary | ICD-10-CM | POA: Diagnosis present

## 2020-02-18 DIAGNOSIS — Z3A37 37 weeks gestation of pregnancy: Secondary | ICD-10-CM

## 2020-02-18 DIAGNOSIS — O1404 Mild to moderate pre-eclampsia, complicating childbirth: Secondary | ICD-10-CM | POA: Diagnosis present

## 2020-02-18 DIAGNOSIS — F410 Panic disorder [episodic paroxysmal anxiety] without agoraphobia: Secondary | ICD-10-CM | POA: Diagnosis present

## 2020-02-18 DIAGNOSIS — F429 Obsessive-compulsive disorder, unspecified: Secondary | ICD-10-CM | POA: Diagnosis present

## 2020-02-18 DIAGNOSIS — E039 Hypothyroidism, unspecified: Secondary | ICD-10-CM | POA: Diagnosis present

## 2020-02-18 DIAGNOSIS — Z20822 Contact with and (suspected) exposure to covid-19: Secondary | ICD-10-CM | POA: Diagnosis present

## 2020-02-18 DIAGNOSIS — O26893 Other specified pregnancy related conditions, third trimester: Secondary | ICD-10-CM | POA: Diagnosis present

## 2020-02-18 DIAGNOSIS — Z87442 Personal history of urinary calculi: Secondary | ICD-10-CM | POA: Diagnosis not present

## 2020-02-18 DIAGNOSIS — O99284 Endocrine, nutritional and metabolic diseases complicating childbirth: Secondary | ICD-10-CM | POA: Diagnosis present

## 2020-02-18 LAB — TSH: TSH: 1.721 u[IU]/mL (ref 0.350–4.500)

## 2020-02-18 LAB — PROTEIN / CREATININE RATIO, URINE
Creatinine, Urine: 163.82 mg/dL
Protein Creatinine Ratio: 0.13 mg/mg{Cre} (ref 0.00–0.15)
Total Protein, Urine: 21 mg/dL

## 2020-02-18 LAB — COMPREHENSIVE METABOLIC PANEL
ALT: 10 U/L (ref 0–44)
AST: 25 U/L (ref 15–41)
Albumin: 3.1 g/dL — ABNORMAL LOW (ref 3.5–5.0)
Alkaline Phosphatase: 82 U/L (ref 38–126)
Anion gap: 12 (ref 5–15)
BUN: 7 mg/dL (ref 6–20)
CO2: 18 mmol/L — ABNORMAL LOW (ref 22–32)
Calcium: 8.8 mg/dL — ABNORMAL LOW (ref 8.9–10.3)
Chloride: 105 mmol/L (ref 98–111)
Creatinine, Ser: 0.6 mg/dL (ref 0.44–1.00)
GFR, Estimated: >60 mL/min (ref >60)
Glucose, Bld: 75 mg/dL (ref 70–99)
Potassium: 3.6 mmol/L (ref 3.5–5.1)
Sodium: 135 mmol/L (ref 135–145)
Total Bilirubin: 0.8 mg/dL (ref 0.3–1.2)
Total Protein: 6.7 g/dL (ref 6.5–8.1)

## 2020-02-18 LAB — T4, FREE: Free T4: 0.9 ng/dL (ref 0.61–1.12)

## 2020-02-18 LAB — POCT FERN TEST: POCT Fern Test: POSITIVE

## 2020-02-18 LAB — CBC
HCT: 37.9 % (ref 36.0–46.0)
Hemoglobin: 12.6 g/dL (ref 12.0–15.0)
MCH: 30.7 pg (ref 26.0–34.0)
MCHC: 33.2 g/dL (ref 30.0–36.0)
MCV: 92.4 fL (ref 80.0–100.0)
Platelets: 185 10*3/uL (ref 150–400)
RBC: 4.1 MIL/uL (ref 3.87–5.11)
RDW: 12.3 % (ref 11.5–15.5)
WBC: 5.6 10*3/uL (ref 4.0–10.5)
nRBC: 0 % (ref 0.0–0.2)

## 2020-02-18 LAB — TYPE AND SCREEN
ABO/RH(D): B POS
Antibody Screen: NEGATIVE

## 2020-02-18 LAB — SARS CORONAVIRUS 2 BY RT PCR (HOSPITAL ORDER, PERFORMED IN ~~LOC~~ HOSPITAL LAB): SARS Coronavirus 2: NEGATIVE

## 2020-02-18 MED ORDER — EPHEDRINE 5 MG/ML INJ: 10.0000 mg | INTRAVENOUS | Status: DC | PRN

## 2020-02-18 MED ORDER — LACTATED RINGERS IV SOLN
500.0000 mL | Freq: Once | INTRAVENOUS | Status: AC
  Administered 2020-02-18: 500 mL via INTRAVENOUS

## 2020-02-18 MED ORDER — DIPHENHYDRAMINE HCL 50 MG/ML IJ SOLN
12.5000 mg | INTRAMUSCULAR | Status: DC | PRN
Start: 2020-02-18 — End: 2020-02-19

## 2020-02-18 MED ORDER — LIDOCAINE HCL (PF) 1 % IJ SOLN: 30.0000 mL | INTRAMUSCULAR | Status: DC | PRN

## 2020-02-18 MED ORDER — OXYTOCIN BOLUS FROM INFUSION
333.0000 mL | Freq: Once | INTRAVENOUS | Status: AC
  Administered 2020-02-19: 333 mL via INTRAVENOUS

## 2020-02-18 MED ORDER — ONDANSETRON HCL 4 MG/2ML IJ SOLN
4.0000 mg | Freq: Four times a day (QID) | INTRAMUSCULAR | Status: DC | PRN
  Administered 2020-02-18: 4 mg via INTRAVENOUS
  Filled 2020-02-18: qty 2

## 2020-02-18 MED ORDER — LACTATED RINGERS IV SOLN: INTRAVENOUS | Status: DC

## 2020-02-18 MED ORDER — HYDROXYZINE HCL 25 MG PO TABS
50.0000 mg | ORAL_TABLET | Freq: Three times a day (TID) | ORAL | Status: DC | PRN
  Administered 2020-02-18: 50 mg via ORAL
  Filled 2020-02-18 (×2): qty 1

## 2020-02-18 MED ORDER — ACETAMINOPHEN 325 MG PO TABS: 650.0000 mg | ORAL_TABLET | ORAL | Status: DC | PRN

## 2020-02-18 MED ORDER — FENTANYL-BUPIVACAINE-NACL 0.5-0.125-0.9 MG/250ML-% EP SOLN
12.0000 mL/h | EPIDURAL | Status: DC | PRN
  Administered 2020-02-19: 12 mL/h via EPIDURAL
  Filled 2020-02-18: qty 250

## 2020-02-18 MED ORDER — MISOPROSTOL 50MCG HALF TABLET: 50.0000 ug | ORAL_TABLET | ORAL | Status: DC | PRN

## 2020-02-18 MED ORDER — OXYCODONE-ACETAMINOPHEN 5-325 MG PO TABS: 2.0000 | ORAL_TABLET | ORAL | Status: DC | PRN

## 2020-02-18 MED ORDER — OXYCODONE-ACETAMINOPHEN 5-325 MG PO TABS: 1.0000 | ORAL_TABLET | ORAL | Status: DC | PRN

## 2020-02-18 MED ORDER — LACTATED RINGERS IV SOLN: 500.0000 mL | INTRAVENOUS | Status: DC | PRN

## 2020-02-18 MED ORDER — OXYTOCIN-SODIUM CHLORIDE 30-0.9 UT/500ML-% IV SOLN
1.0000 m[IU]/min | INTRAVENOUS | Status: DC
  Administered 2020-02-18: 2 m[IU]/min via INTRAVENOUS
  Filled 2020-02-18: qty 500

## 2020-02-18 MED ORDER — SOD CITRATE-CITRIC ACID 500-334 MG/5ML PO SOLN
30.0000 mL | ORAL | Status: DC | PRN
  Administered 2020-02-19: 30 mL via ORAL
  Filled 2020-02-18: qty 15

## 2020-02-18 MED ORDER — PHENYLEPHRINE 40 MCG/ML (10ML) SYRINGE FOR IV PUSH (FOR BLOOD PRESSURE SUPPORT): 80.0000 ug | PREFILLED_SYRINGE | INTRAVENOUS | Status: DC | PRN

## 2020-02-18 MED ORDER — FENTANYL CITRATE (PF) 100 MCG/2ML IJ SOLN
50.0000 ug | INTRAMUSCULAR | Status: DC | PRN
  Administered 2020-02-18: 100 ug via INTRAVENOUS
  Filled 2020-02-18: qty 2

## 2020-02-18 MED ORDER — TERBUTALINE SULFATE 1 MG/ML IJ SOLN: 0.2500 mg | Freq: Once | INTRAMUSCULAR | Status: DC | PRN

## 2020-02-18 MED ORDER — OXYTOCIN-SODIUM CHLORIDE 30-0.9 UT/500ML-% IV SOLN
2.5000 [IU]/h | INTRAVENOUS | Status: DC
  Administered 2020-02-19: 2.5 [IU]/h via INTRAVENOUS

## 2020-02-18 NOTE — H&P (Signed)
Farrie Sann is a 30 y.o. female, G1P0000, IUP at 37.2 weeks, presenting for spontaneous labor with SROM, clear at 0830 on 1/26. Pt endorse + Fm. Denies vaginal bleeding.  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 Normal labor and delivery 02/18/2020  . Preterm uterine contractions in third trimester, antepartum 01/27/2020  . Flank pain 11/07/2019  . Seasonal and perennial allergic rhinitis 04/16/2019  . Palpitations 01/15/2019  . Hypokalemia 01/15/2019  . Nonallopathic lesion of lumbosacral region 10/02/2017  . Nonallopathic lesion of sacral region 10/02/2017  . Irritable bowel syndrome 06/21/2017  . Routine general medical examination at a health care facility 06/14/2017  . 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 Nonallopathic lesion of thoracic region 04/30/2017  . Nonallopathic lesion of rib cage 04/30/2017  . Nonallopathic lesion of cervical region 04/30/2017  . ANA positive 10/31/2016  . Anxiety 08/15/2016  . Hypothyroidism 02/03/2016  . GERD (gastroesophageal reflux disease) 02/11/2015  . Kidney stones 02/11/2015  . Pelvic pain 08/20/2014      Medications Prior to Admission  Medication Sig Dispense Refill Last Dose  . cetirizine (ZYRTEC) 10 MG tablet Take 10 mg by mouth daily.   02/17/2020 at Unknown time  . cholecalciferol (VITAMIN D3) 25 MCG (1000 UT) tablet Take 1,000 Units by mouth daily.   02/18/2020 at Unknown time  . Cyanocobalamin (VITAMIN B 12 PO) Place 1 tablet under the tongue daily.    02/18/2020 at Unknown time  . esomeprazole (NEXIUM) 20 MG packet Take 20 mg by mouth daily before breakfast. Once daily   02/18/2020 at Unknown time  . ferrous sulfate 325 (65 FE) MG tablet Take 325 mg by mouth daily with breakfast.   02/18/2020 at Unknown time  . FLUoxetine HCl (PROZAC PO) Take 40 mg by mouth daily.   02/18/2020 at Unknown time  . levothyroxine (SYNTHROID) 75 MCG tablet TAKE 1 TABLET BY MOUTH ONCE DAILY BEFORE BREAKFAST 90 tablet 1 02/18/2020 at Unknown time  . levothyroxine (SYNTHROID) 75 MCG tablet Take 75 mcg by mouth daily before breakfast.   02/18/2020 at Unknown time  . Prenatal Vit-Fe Fumarate-FA (PRENATAL VITAMINS PO) Take by mouth.   02/18/2020 at Unknown time  . docusate sodium (COLACE) 100 MG capsule Take 100 mg by mouth 2 (two) times daily.     . polyethylene glycol (MIRALAX / GLYCOLAX) 17 g packet Take 17 g by mouth daily.     . Prenatal Vit-Fe Fumarate-FA (PRENATAL MULTIVITAMIN) TABS tablet Take 1 tablet by mouth daily at 12 noon.       Past Medical History:  Diagnosis Date  . Allergy   . Anxiety   . Asthma    physical induced asthma   . Eating disorder   . Family history of adverse reaction to anesthesia   . Frequent headaches   . Gastritis   .  GERD (gastroesophageal reflux disease)   . Hiatal hernia   . Hypothyroid   . IBS (irritable bowel syndrome)   . Kidney stones   . Obsessive compulsive disorder   . Ovarian cyst   . PONV (postoperative nausea and vomiting)   . Pregnancy induced hypertension   . Sjogren's disease ([place])   . Sjogren's syndrome ([place])   . Thyroid disease   . UTI (urinary  tract infection)   . Vaginal Pap smear, abnormal   . Vision abnormalities      No current facility-administered medications on file prior to encounter.   Current Outpatient Medications on File Prior to Encounter  Medication Sig Dispense Refill  . cetirizine (ZYRTEC) 10 MG tablet Take 10 mg by mouth daily.    . cholecalciferol (VITAMIN D3) 25 MCG (1000 UT) tablet Take 1,000 Units by mouth daily.    . Cyanocobalamin (VITAMIN B 12 PO) Place 1 tablet under the tongue daily.     Marland Kitchen esomeprazole (NEXIUM) 20 MG packet Take 20 mg by mouth daily before breakfast. Once daily    . ferrous sulfate 325 (65 FE) MG tablet Take 325 mg by mouth daily with breakfast.    . FLUoxetine HCl (PROZAC PO) Take 40 mg by mouth daily.    Marland Kitchen levothyroxine (SYNTHROID) 75 MCG tablet TAKE 1 TABLET BY MOUTH ONCE DAILY BEFORE BREAKFAST 90 tablet 1  . levothyroxine (SYNTHROID) 75 MCG tablet Take 75 mcg by mouth daily before breakfast.    . Prenatal Vit-Fe Fumarate-FA (PRENATAL VITAMINS PO) Take by mouth.    . docusate sodium (COLACE) 100 MG capsule Take 100 mg by mouth 2 (two) times daily.    . polyethylene glycol (MIRALAX / GLYCOLAX) 17 g packet Take 17 g by mouth daily.    . Prenatal Vit-Fe Fumarate-FA (PRENATAL MULTIVITAMIN) TABS tablet Take 1 tablet by mouth daily at 12 noon.    . [DISCONTINUED] albuterol (PROVENTIL HFA;VENTOLIN HFA) 108 (90 Base) MCG/ACT inhaler Inhale 2 puffs into the lungs every 4 (four) hours as needed for wheezing or shortness of breath. 1 each 6  . [DISCONTINUED] Fluticasone Propionate (XHANCE) 93 MCG/ACT EXHU Place 32 sprays into the nose in the morning and at bedtime. 32 mL 5  . [DISCONTINUED] omeprazole (PRILOSEC) 40 MG capsule Take 1 capsule (40 mg total) by mouth daily. 90 capsule 0  . [DISCONTINUED] sucralfate (CARAFATE) 1 g tablet Take 1 tablet (1 g total) by mouth 4 (four) times daily -  with meals and at bedtime. 120 tablet 0     Allergies  Allergen Reactions  . Soy Allergy Anaphylaxis     Large amounts cause anaphylaxis Small amounts cause upset stomach  . Medrol [Methylprednisolone] Hives  . Montelukast Sodium Other (See Comments)    Bad dreams, nightmares    History of present pregnancy: Pt Info/Preference:  Screening/Consents:  Labs:   EDD: Estimated Date of Delivery: 03/08/20  Establised: Patient's last menstrual period was 06/02/2019.  Anatomy Scan: Date: @21 .2wg Placenta Location: posterior Genetic Screen: Panor[place]:LR female AFP:  First Tri: Quad:  Office: [facility]            First PNV: 16.2 wg Blood Type --/--/B POS (01/04 2309)  Language: english Last PNV: 37.2 wg Rhogam  N/A  Flu Vaccine:  UTD   Antibody NEG (01/04 2309)  TDaP vaccine UTD   GTT: Early: 5.2 Third Trimester: 86  Feeding Plan: Breast BTL: no Rubella:  Immune  Contraception: ??? VBAC: no RPR:   NR  Circumcision: N/A  HBsAg/HCV:  Neg/Neg  Pediatrician:  ???   HIV:   Neg  Prenatal Classes: No Additional [place]: 1/25 BPP 8/8, AFI 10.6 12/29 growth 5.1lbs 56%, posterior placenta, AFI 14.5 GBS:  Negative (For PCN allergy, check sensitivities)       Chlamydia: Neg    MFM Referral/Consult: Offered fetal echo pt declined GC: Neg  Support Person: partner   PAP: See HPI  Pain Management: Possible epidural Neonatologist Referral:  Hgb Electrophoresis:  AA  Birth Plan: DCC   Hgb NOB: 13.2    28W: 11.2   OB History    Gravida  1   Para  0   Term  0   Preterm  0   AB  0   Living        SAB  0   IAB  0   Ectopic  0   Multiple      Live Births             Past Medical History:  Diagnosis Date  . Allergy   . Anxiety   . Asthma    physical induced asthma   . Eating disorder   . Family history of adverse reaction to anesthesia   . Frequent headaches   . Gastritis   . GERD (gastroesophageal reflux disease)   . Hiatal hernia   . Hypothyroid   . IBS (irritable bowel syndrome)   . Kidney stones   . Obsessive compulsive disorder   . Ovarian cyst   . PONV (postoperative nausea and  vomiting)   . Pregnancy induced hypertension   . Sjogren's disease ([place])   . Sjogren's syndrome ([place])   . Thyroid disease   . UTI (urinary tract infection)   . Vaginal Pap smear, abnormal   . Vision abnormalities    Past Surgical History:  Procedure Laterality Date  . ADENOIDECTOMY    . CERVIX LESION DESTRUCTION    . COLONOSCOPY    . CYSTOSCOPY    . KIDNEY STONE SURGERY    . KNEE ARTHROSCOPY    . KNEE SURGERY     Family History: family history includes Arthritis in her maternal grandmother; Atrial fibrillation in her mother; Cancer in her paternal grandmother; Colon polyps in her father; Depression in her mother; Diabetes in her father and paternal grandfather; Diabetes Mellitus II in her sister; Heart disease in her maternal grandmother and paternal grandfather; Hypertension in her father, paternal grandfather, and paternal grandmother; Hypothyroidism in her sister; Thyroid disease in her mother and sister. Social History:  reports that she quit smoking about 8 years ago. She has never used smokeless tobacco. She reports previous alcohol use. She reports that she does not use drugs.   Prenatal Transfer Tool  Maternal Diabetes: No Genetic Screening: Normal Maternal Ultrasounds/Referrals: Normal Fetal Ultrasounds or other Referrals:  NoneDeclined fetal echo  Maternal Substance Abuse:  No Significant Maternal Medications:  Meds include: Other:  Align B12 cetirizine epinephrine fluoxetine levothyroxine lorazepam Nexium ondansetron Prenatal sucralfate Vitamin D3 Xhance Significant Maternal Lab Results: Group B Strep negative  ROS:  Review of Systems  Constitutional: Negative.   HENT: Negative.   Eyes: Negative.   Respiratory: Negative.   Cardiovascular: Negative.   Gastrointestinal: Positive for abdominal pain.  Genitourinary:       Vaginal leakage  Musculoskeletal: Negative.   Skin: Negative.   Neurological: Negative.   Endo/Heme/Allergies: Negative.    Psychiatric/Behavioral: Negative.      Physical Exam: BP 132/81 (BP Location: Left Arm)   Pulse  86   Temp 98.5 F (36.9 C) (Oral)   Resp 18   Wt 72.3 kg   LMP 06/02/2019   SpO2 100%   BMI 29.14 kg/m   Physical Exam Constitutional:      Appearance: Normal appearance.  HENT:     Head: Normocephalic and atraumatic.     Nose: Nose normal.     Mouth/Throat:     Mouth: Mucous membranes are moist.  Eyes:     Conjunctiva/sclera: Conjunctivae normal.     Pupils: Pupils are equal, round, and reactive to light.  Cardiovascular:     Rate and Rhythm: Normal rate and regular rhythm.     Pulses: Normal pulses.     Heart sounds: Normal heart sounds.  Pulmonary:     Effort: Pulmonary effort is normal.     Breath sounds: Normal breath sounds.  Abdominal:     Palpations: Abdomen is soft.  Genitourinary:    Comments: Pelvis adequate, uterus gravida equal to dates. Musculoskeletal:        General: Normal range of motion.     Cervical back: Normal range of motion and neck supple.  Skin:    General: Skin is warm.     Capillary Refill: Capillary refill takes less than 2 seconds.  Neurological:     General: No focal deficit present.     Mental Status: She is alert.  Psychiatric:        Mood and Affect: Mood normal.      NST: FHR baseline 120 bpm, Variability: moderate, Accelerations:present, Decelerations:  Absent= Cat 1/Reactive UC:   irregular, every 2-8 minutes SVE:   Dilation: 3 Effacement (%): 70 Station: -1 Exam by:: Foss, vertex verified by fetal sutures.  Leopold's: Position vertex, EFW 6.5lbs via leopold's.   Labs: Results for orders placed or performed during the hospital encounter of 02/18/20 (from the past 24 hour(s))  Fern Test     Status: None   Collection Time: 02/18/20  4:17 PM  Result Value Ref Range   POCT Fern Test Positive = ruptured amniotic membanes   SARS Coronavirus 2 by RT PCR (hospital order, performed in [facility] hospital lab)  Nasopharyngeal Nasopharyngeal Swab     Status: None   Collection Time: 02/18/20  5:00 PM   Specimen: Nasopharyngeal Swab  Result Value Ref Range   SARS Coronavirus 2 NEGATIVE NEGATIVE    Imaging:  No results found.  MAU Course: Orders Placed This Encounter  Procedures  . SARS Coronavirus 2 by RT PCR (hospital order, performed in [facility] hospital lab) Nasopharyngeal Nasopharyngeal Swab  . CBC  . RPR  . Protein / creatinine ratio, urine  . Comprehensive metabolic panel  . TSH  . T4, free  . Diet clear liquid Room service appropriate? Yes; Fluid consistency: Thin  . Vitals signs per unit policy  . Notify Physician  . Fetal monitoring per unit policy  . Activity as tolerated  . Cervical Exam  . Measure blood pressure post delivery every 15 min x 1 hour then every 30 min x 1 hour  . Fundal check post delivery every 15 min x 1 hour then every 30 min x 1 hour  . If Rapid HIV test positive or known HIV positive: initiate AZT orders  . May in and out cath x 2 for inability to void  . Insert foley catheter  . Discontinue foley prior to vaginal delivery  . Initiate Carrier Fluid Protocol  . Initiate Oral Care Protocol  .  Order Rapid HIV per protocol if no results on chart  . Patient may have epidural placement upon request  . Evaluate fetal heart rate to establish reassuring pattern prior to initiating Cytotec or Pitocin  . Perform a cervical exam prior to initiating Cytotec or Pitocin  . Discontinue Pitocin if tachysystole with non-reassuring FHR is present  . Nofify MD/CNM if tachysystole with non-reassuring FHR is present  . Initiate intrauterine resuscitation if tachysystole with non-reasuring FHR is present  . If tachysystole WITH reassuring FHR present notify MD / CNM  . May administer Terbutaline 0.25 mg SQ x 1 dose if tachysystole with non-reassuring FHR is presesnt  . Labor Induction  . May use local infiltration of 1% lidocaine plain to produce a skin wheal prior to IV  insertion  . Notify in-house Anesthesia team of nausea and vomiting greater than 5 hours  . Assess for signs/symptoms of PIH/preeclampsia  . RN to place order for: CBC if one has not been drawn in the past 6 hours for all patients with hypertensive disease, pre-eclampsia, eclampsia, thrombocytopenia or previous PLTC<150,000.  . Identify to Anesthesia if patient plans to have postpartum tubal ligation; do not remove epidural without discussion with Anesthesiologist  . Vital signs following Epidural Placement, re-bolus or re-dose monitor patient's BP and oxygen saturation every 5 minutes for 30 minutes  . RN to remain at bedside continuously for 30 minutes post epidural placement, post re-bolus / re-dose  . Notify Anesthesia if the patient becomes short of breath or complains of heaviness in chest, chest pain, and/or unrelieved pain  . Notify Anesthesia prior to discontinuing epidural infusion  . Evaluate fetal heart rate to establish reassuring pattern prior to initiating Cytotec or Pitocin  . Perform a cervical exam prior to initiating Cytotec or Pitocin  . Discontinue Pitocin if tachysystole with non-reassuring FHR is present  . Nofify MD/CNM if tachysystole with non-reassuring FHR is present  . Initiate intrauterine resuscitation if tachysystole with non-reasuring FHR is present  . If tachysystole WITH reassuring FHR present notify MD / CNM  . May administer Terbutaline 0.25 mg SQ x 1 dose if tachysystole with non-reassuring FHR is presesnt  . Labor Augmentation  . Full code  . Airborne and Contact precautions  . Fern Test  . Type and screen [facility]  . Insert and maintain IV Line  . Admit to Inpatient (patient's expected length of stay will be greater than 2 midnights or inpatient only procedure)   Meds ordered this encounter  Medications  . lactated ringers infusion  . oxytocin (PITOCIN) IV BOLUS FROM BAG  . oxytocin (PITOCIN) IV infusion 30 units in NS 500 mL -  Premix  . lactated ringers infusion 500-1,000 mL  . acetaminophen (TYLENOL) tablet 650 mg  . oxyCODONE-acetaminophen (PERCOCET/ROXICET) 5-325 MG per tablet 1 tablet  . oxyCODONE-acetaminophen (PERCOCET/ROXICET) 5-325 MG per tablet 2 tablet  . ondansetron (ZOFRAN) injection 4 mg  . sodium citrate-citric acid (ORACIT) solution 30 mL  . lidocaine (PF) (XYLOCAINE) 1 % injection 30 mL  . fentaNYL (SUBLIMAZE) injection 50-100 mcg  . terbutaline (BRETHINE) injection 0.25 mg  . DISCONTD: misoprostol (CYTOTEC) tablet 50 mcg  . ePHEDrine injection 10 mg  . PHENYLephrine 40 mcg/ml in normal saline Adult IV Push Syringe (For Blood Pressure Support)  . lactated ringers infusion 500 mL  . fentaNYL 2 mcg/mL w/ bupivacaine 0.125% in NS 250 mL epidural infusion ([facility]AN[facility])  . diphenhydrAMINE (BENADRYL) injection 12.5 mg  . ePHEDrine injection 10 mg  .  PHENYLephrine 40 mcg/ml in normal saline Adult IV Push Syringe (For Blood Pressure Support)  . hydrOXYzine (ATARAX/VISTARIL) tablet 50 mg  . terbutaline (BRETHINE) injection 0.25 mg  . oxytocin (PITOCIN) IV infusion 30 units in NS 500 mL - Premix    Order Specific Question:   Begin infusion at:    Answer:   2 milli-units/min (2 mL/hr)    Order Specific Question:   Increase infusion by:    Answer:   2 milli-units/min (2 mL/hr)    Assessment/Plan: Cladie Ramberg is a 30 y.o. female, G1P0000, IUP at 37.2 weeks, presenting for spontaneous labor with SROM, clear at 0830 on 1/26. Pt endorse + Fm. Denies vaginal bleeding.  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   I discussed with patient risks, benefits and alternatives of labor induction including higher risk of cesarean delivery compared to spontaneous labor.  We discussed risks of induction agents including effects on fetal heart beat, contraction pattern and need for close monitoring.  Patient expressed understanding of all this and desired to proceed with the induction. Risks and benefits of induction were reviewed, including failure of method, prolonged labor, need for further intervention, risk of cesarean.  Patient and family verbalized understanding and denies any further questions at this time. Pt and family wish to proceed with induction process. Discussed induction options of Cytotec PO, AROM, and pitocin were reviewed as well as risks and benefits with use of each discussed.  Plan: Admit to [place] per consult with Dr Charlesetta Garibaldi Routine [facility] orders GHTN: CMP, CBC, Urine PCR pending.  Anxiety: Continue prozac, atarax. Hypothyroidism: 32mcg levothyroxine continue Pain med/epidural prn Start pitocin 2x2 Anticipate labor progression   Noralyn Pick NP-C, CNM, MSN 02/18/2020, 6:17 PM

## 2020-02-18 NOTE — Anesthesia Preprocedure Evaluation (Addendum)
Anesthesia Evaluation  Patient identified by MRN, date of birth, ID band Patient awake    Reviewed: Allergy & Precautions, Patient's Chart, lab work & pertinent test results  History of Anesthesia Complications (+) PONV, Family history of anesthesia reaction and history of anesthetic complications  Airway Mallampati: II  TM Distance: >3 FB Neck ROM: Full    Dental   Pulmonary asthma , former smoker,    Pulmonary exam normal breath sounds clear to auscultation       Cardiovascular hypertension, Normal cardiovascular exam Rhythm:Regular Rate:Normal     Neuro/Psych  Headaches, PSYCHIATRIC DISORDERS Anxiety    GI/Hepatic Neg liver ROS, hiatal hernia, GERD  ,  Endo/Other  Hypothyroidism   Renal/GU Renal disease     Musculoskeletal negative musculoskeletal ROS (+)   Abdominal   Peds  Hematology negative hematology ROS (+)   Anesthesia Other Findings   Reproductive/Obstetrics (+) Pregnancy                            Anesthesia Physical Anesthesia Plan  ASA: III  Anesthesia Plan: Epidural   Post-op Pain Management:    Induction:   PONV Risk Score and Plan:   Airway Management Planned:   Additional Equipment:   Intra-op Plan:   Post-operative Plan:   Informed Consent: I have reviewed the patients History and Physical, chart, labs and discussed the procedure including the risks, benefits and alternatives for the proposed anesthesia with the patient or authorized representative who has indicated his/her understanding and acceptance.       Plan Discussed with:   Anesthesia Plan Comments:         Anesthesia Quick Evaluation

## 2020-02-18 NOTE — Progress Notes (Addendum)
Michelle Waller is a 30 y.o. G1P0000 at [redacted]w[redacted]d by LMP admitted for SROM on 02/18/20 @ 0830. Clear fluid.   Subjective:  Michelle Waller is up and ambulating in her room. Husband at side. Ctx q 3-4 min. Breathing through them. Wanting a natural labor. No complaints at present. Pitocin infusion at 29mu. CAT 1.  Objective: Vitals:   02/18/20 1948 02/18/20 2031 02/18/20 2045 02/18/20 2116  BP:  (!) 142/84  (!) 143/90  Pulse:  81  100  Resp:      Temp:   98.4 F (36.9 C)   TempSrc: Oral  Axillary   SpO2:      Weight:      Height:        No intake/output data recorded.    FHT:  FHR: 125 bpm, variability: moderate,  accelerations:  Present,  decelerations:  Absent UC:   regular, every 3-4 minutes SVE:   Dilation: 3 Effacement (%): 50 Station: -1 Exam by:: Keith Rake RN  Labs:   Recent Labs    02/18/20 1823  WBC 5.6  HGB 12.6  HCT 37.9  PLT 185   CMP Latest Ref Rng & Units 02/18/2020 01/31/2020 02/03/2019  Glucose 70 - 99 mg/dL 75 92 112(H)  BUN 6 - 20 mg/dL 7 6 14   Creatinine 0.44 - 1.00 mg/dL 0.60 0.53 0.71  Sodium 135 - 145 mmol/L 135 135 140  Potassium 3.5 - 5.1 mmol/L 3.6 3.2(L) 3.4(L)  Chloride 98 - 111 mmol/L 105 104 104  CO2 22 - 32 mmol/L 18(L) 20(L) 27  Calcium 8.9 - 10.3 mg/dL 8.8(L) 8.8(L) 9.2  Total Protein 6.5 - 8.1 g/dL 6.7 6.2(L) -  Total Bilirubin 0.3 - 1.2 mg/dL 0.8 0.3 -  Alkaline Phos 38 - 126 U/L 82 57 -  AST 15 - 41 U/L 25 18 -  ALT 0 - 44 U/L 10 16 -    PCR: 0.13 TSH:1.721 Free T4: 0.90  Assessment / Plan: 30 y.o. AGP@ [redacted]w[redacted]d, SROM at 0830, clear. Augmentation of labor with pitocin.  Labor: Progressing on Pitocin, will continue to increase then AROM GHTN:  labs stable Fetal Wellbeing:  Category I Pain Control:  Labor support without medications I/D:  GBS negative Anticipated MOD:  NSVD   Holli Humbles MSN, CNM 02/18/2020, 10:26 PM

## 2020-02-18 NOTE — MAU Note (Signed)
Pt states that she has been possibly leaking fluid since this 830 this morning, she also stated that she has back cramps since two days ago that come and go/

## 2020-02-19 ENCOUNTER — Encounter (HOSPITAL_COMMUNITY): Payer: Self-pay | Admitting: Obstetrics and Gynecology

## 2020-02-19 LAB — CBC
HCT: 33.2 % — ABNORMAL LOW (ref 36.0–46.0)
Hemoglobin: 11.1 g/dL — ABNORMAL LOW (ref 12.0–15.0)
MCH: 30.8 pg (ref 26.0–34.0)
MCHC: 33.4 g/dL (ref 30.0–36.0)
MCV: 92.2 fL (ref 80.0–100.0)
Platelets: 159 10*3/uL (ref 150–400)
RBC: 3.6 MIL/uL — ABNORMAL LOW (ref 3.87–5.11)
RDW: 12.1 % (ref 11.5–15.5)
WBC: 10.8 10*3/uL — ABNORMAL HIGH (ref 4.0–10.5)
nRBC: 0 % (ref 0.0–0.2)

## 2020-02-19 LAB — RPR: RPR Ser Ql: NONREACTIVE

## 2020-02-19 MED ORDER — LIDOCAINE HCL (PF) 1 % IJ SOLN
INTRAMUSCULAR | Status: DC | PRN
  Administered 2020-02-18 (×2): 4 mL via EPIDURAL

## 2020-02-19 MED ORDER — FAMOTIDINE IN NACL 20-0.9 MG/50ML-% IV SOLN
20.0000 mg | Freq: Once | INTRAVENOUS | Status: AC
  Administered 2020-02-19: 20 mg via INTRAVENOUS
  Filled 2020-02-19: qty 50

## 2020-02-19 MED ORDER — FLUOXETINE HCL 20 MG PO CAPS
40.0000 mg | ORAL_CAPSULE | Freq: Every day | ORAL | Status: DC
  Administered 2020-02-19 – 2020-02-21 (×3): 40 mg via ORAL
  Filled 2020-02-19 (×3): qty 2

## 2020-02-19 MED ORDER — HYDROXYZINE HCL 25 MG PO TABS
50.0000 mg | ORAL_TABLET | Freq: Three times a day (TID) | ORAL | Status: DC | PRN
  Administered 2020-02-19 – 2020-02-21 (×2): 50 mg via ORAL
  Filled 2020-02-19 (×2): qty 2

## 2020-02-19 MED ORDER — SIMETHICONE 80 MG PO CHEW: 80.0000 mg | CHEWABLE_TABLET | ORAL | Status: DC | PRN

## 2020-02-19 MED ORDER — OXYCODONE HCL 5 MG PO TABS: 5.0000 mg | ORAL_TABLET | ORAL | Status: DC | PRN

## 2020-02-19 MED ORDER — OXYCODONE HCL 5 MG PO TABS: 10.0000 mg | ORAL_TABLET | ORAL | Status: DC | PRN

## 2020-02-19 MED ORDER — PRENATAL MULTIVITAMIN CH
1.0000 | ORAL_TABLET | Freq: Every day | ORAL | Status: DC
  Administered 2020-02-19 – 2020-02-21 (×3): 1 via ORAL
  Filled 2020-02-19 (×3): qty 1

## 2020-02-19 MED ORDER — ZOLPIDEM TARTRATE 5 MG PO TABS: 5.0000 mg | ORAL_TABLET | Freq: Every evening | ORAL | Status: DC | PRN

## 2020-02-19 MED ORDER — IBUPROFEN 600 MG PO TABS
600.0000 mg | ORAL_TABLET | Freq: Four times a day (QID) | ORAL | Status: DC
  Administered 2020-02-19 – 2020-02-21 (×10): 600 mg via ORAL
  Filled 2020-02-19 (×10): qty 1

## 2020-02-19 MED ORDER — BENZOCAINE-MENTHOL 20-0.5 % EX AERO: 1.0000 "application " | INHALATION_SPRAY | CUTANEOUS | Status: DC | PRN

## 2020-02-19 MED ORDER — TETANUS-DIPHTH-ACELL PERTUSSIS 5-2.5-18.5 LF-MCG/0.5 IM SUSY: 0.5000 mL | PREFILLED_SYRINGE | Freq: Once | INTRAMUSCULAR | Status: DC

## 2020-02-19 MED ORDER — ONDANSETRON HCL 4 MG/2ML IJ SOLN: 4.0000 mg | INTRAMUSCULAR | Status: DC | PRN

## 2020-02-19 MED ORDER — DIPHENHYDRAMINE HCL 25 MG PO CAPS: 25.0000 mg | ORAL_CAPSULE | Freq: Four times a day (QID) | ORAL | Status: DC | PRN

## 2020-02-19 MED ORDER — SENNOSIDES-DOCUSATE SODIUM 8.6-50 MG PO TABS
2.0000 | ORAL_TABLET | Freq: Every day | ORAL | Status: DC
  Administered 2020-02-20 – 2020-02-21 (×2): 2 via ORAL
  Filled 2020-02-19 (×2): qty 2

## 2020-02-19 MED ORDER — ACETAMINOPHEN 325 MG PO TABS
650.0000 mg | ORAL_TABLET | ORAL | Status: DC | PRN
  Administered 2020-02-20 – 2020-02-21 (×2): 650 mg via ORAL
  Filled 2020-02-19 (×2): qty 2

## 2020-02-19 MED ORDER — LEVOTHYROXINE SODIUM 75 MCG PO TABS
75.0000 ug | ORAL_TABLET | Freq: Every day | ORAL | Status: DC
  Administered 2020-02-19 – 2020-02-21 (×3): 75 ug via ORAL
  Filled 2020-02-19 (×3): qty 1

## 2020-02-19 MED ORDER — WITCH HAZEL-GLYCERIN EX PADS: 1.0000 "application " | MEDICATED_PAD | CUTANEOUS | Status: DC | PRN

## 2020-02-19 MED ORDER — ONDANSETRON HCL 4 MG PO TABS: 4.0000 mg | ORAL_TABLET | ORAL | Status: DC | PRN

## 2020-02-19 MED ORDER — COCONUT OIL OIL
1.0000 "application " | TOPICAL_OIL | Status: DC | PRN
  Administered 2020-02-20: 1 via TOPICAL

## 2020-02-19 MED ORDER — DIBUCAINE (PERIANAL) 1 % EX OINT: 1.0000 "application " | TOPICAL_OINTMENT | CUTANEOUS | Status: DC | PRN

## 2020-02-19 NOTE — Anesthesia Procedure Notes (Signed)
Epidural Patient location during procedure: OB Start time: 02/18/2020 11:46 PM End time: 02/19/2020 12:03 AM  Staffing Anesthesiologist: Nolon Nations, MD Performed: anesthesiologist   Preanesthetic Checklist Completed: patient identified, IV checked, risks and benefits discussed, monitors and equipment checked, pre-op evaluation and timeout performed  Epidural Patient position: sitting Prep: DuraPrep and site prepped and draped Patient monitoring: heart rate, continuous pulse ox and blood pressure Approach: midline Location: L3-L4 Injection technique: LOR air and LOR saline  Needle:  Needle type: Tuohy  Needle gauge: 17 G Needle length: 9 cm Needle insertion depth: 5 cm Catheter type: closed end flexible Catheter size: 19 Gauge Catheter at skin depth: 10 cm Test dose: negative  Assessment Sensory level: T8 Events: blood not aspirated, injection not painful, no injection resistance, no paresthesia and negative IV test  Additional Notes Reason for block:procedure for pain

## 2020-02-19 NOTE — Lactation Note (Signed)
This note was copied from a baby's chart. Lactation Consultation Note Baby 1 hrs old. Baby is awake and rooting to feed. Mom in BR w/RN on steady. FOB holding baby STS. Baby rooting on FOB chest. When mom comes to bed, LC assisted in latching. Baby eager. Feeding off and on frequently. Mom able to get baby back on. Baby very alert and aggressive at the breast. Baby may be having some difficulty feeling nipple in mouth d/t so short shaft. LC will set up DEBP, shells, in room for mom. Mom is able to hand express colostrum. Mom will be f/u on MBU today.  Patient Name: Michelle Waller [id number] Date: 02/19/2020 Reason for consult: Follow-up assessment;Primapara;Infant < 6lbs;Early term 37-38.6wks Age:76 hours  Maternal Data    Feeding Feeding Type: Breast Fed  LATCH Score Latch: Repeated attempts needed to sustain latch, nipple held in mouth throughout feeding, stimulation needed to elicit sucking reflex.  Audible Swallowing: A few with stimulation  Type of Nipple: Everted at rest and after stimulation  Comfort (Breast/Nipple): Soft / non-tender  Hold (Positioning): Assistance needed to correctly position infant at breast and maintain latch.  LATCH Score: 7  Interventions Interventions: Support pillows;Assisted with latch;Position options;Skin to skin;Breast massage;Hand express;Adjust position;Breast compression  Lactation Tools Discussed/Used     Consult Status Consult Status: Follow-up Date: 02/19/20 Follow-up type: In-patient    Theodoro Kalata 02/19/2020, 4:47 AM

## 2020-02-19 NOTE — Lactation Note (Signed)
This note was copied from a baby's chart. Lactation Consultation Note Baby less than 1 hrs old when started consult. Mom holding baby STS. Baby very sleepy and grunting. Baby appears very small. Assisted in cradle position. Baby had no interest in BF. Hand expressed a dot of colostrum on gloved finger and rubbed on lips. Baby still not interested.  Mom has short shaft compressible nipples. Talked w/mom about hand expressing colostrum and spoon feeding to see if that interested the baby. Mom stated if needs to supplement mom would like colostrum and donor milk. Explained to mom will start out w/her colostrum.  Will f/u on MBU. Mom would like to give colostrum in L&D since baby will not latch.  Patient Name: Michelle Waller OEUMP'N Date: 02/19/2020 Reason for consult: L&D Initial assessment;Early term 37-38.6wks;Infant < 6lbs Age:31 hours  Maternal Data    Feeding    LATCH Score Latch: Too sleepy or reluctant, no latch achieved, no sucking elicited.  Audible Swallowing: None  Type of Nipple: Everted at rest and after stimulation  Comfort (Breast/Nipple): Soft / non-tender  Hold (Positioning): Full assist, staff holds infant at breast  LATCH Score: 4  Interventions Interventions: Adjust position;Support pillows;Assisted with latch;Skin to skin;Breast massage;Breast compression;Hand express  Lactation Tools Discussed/Used     Consult Status Consult Status: Follow-up Date: 02/19/20 Follow-up type: In-patient    Sura Canul, Elta Guadeloupe 02/19/2020, 4:09 AM

## 2020-02-19 NOTE — Progress Notes (Signed)
PPD# 1 SVD w/ right labial laceration Information for the patient's newborn:  Michelle Waller, Michelle Waller [[id number]]  female    Baby Name: [name]:  Received a phone call from RN regarding edinburgh depression screen with a score of 20. Pt with known depression, anxiety and OCD. On Fluoxitine 40mg . Went to [employer]. Shantoya was breastfeeding Estill Bamberg at the time. She was smiling. Appeared to be bonding appropriate with baby. Husband at bedside. Spoke with her about the screening and her panic attacks. She currently is being managed by a psychiatrist. She had weaned her prozac from 60mg  to 40mg  during pregnancy. She reports that she might would like to try another medication to help more with the anxiety. She states she is going to contact her psychiatrist now and see if they can figure out a POC for her. She denies SI or Hi. Reports she just gets overwhelmed. Her parents and husband will be home with her during the post partum period. She will not be alone and will have help with caring for the baby.   Reports feeling "much better". Tolerating PO fluid and solids No nausea or vomiting Bleeding is light Pain controlled with acetaminophen and ibuprofen (OTC) Up ad lib / ambulatory / voiding w/o difficulty Feeding: Breast    O:   VS: BP 126/77 (BP Location: Right Arm)   Pulse 73   Temp 98.9 F (37.2 C) (Oral)   Resp 18   Ht 5\' 2"  (1.575 m)   Wt 72.3 kg   LMP 06/02/2019   SpO2 100%   Breastfeeding Unknown   BMI 29.14 kg/m   LABS:  Recent Labs    02/18/20 1823 02/19/20 0803  WBC 5.6 10.8*  HGB 12.6 11.1*  PLT 185 159   Blood type: --/--/B POS (01/26 1820) Rubella: Immune (07/29 0000)                      I&O: Intake/Output      01/26 0701 01/27 0700 01/27 0701 01/28 0700   Urine (mL/kg/hr) 300    Blood 100    Total Output 400    Net -400          A:  PPD # 0 Kirrah is bonding appropriately with baby. Breastfeeding. Smiling. Reports feeling much better after a nap.    P:  Routine post partum orders Anxiety - vistaril prn  Depression - Prozac 40 mg Pt to contact psychiatrist regarding med change.  RN to obtain psychiatrist contact information.  Dr Mancel Bale updated on pt status and POC.   Domingo Pulse, MSN, CNM 02/19/2020, 2:44 PM

## 2020-02-19 NOTE — Progress Notes (Signed)
Subjective:    Michelle Waller is resting comfortably with epidural in place. Husband at bedside. SVE 5.5/80/0. Will continue with pitocin. CAT 1.   Objective:    VS: BP 138/68   Pulse 71   Temp 97.9 F (36.6 C) (Axillary)   Resp 16   Ht 5\' 2"  (1.575 m)   Wt 72.3 kg   LMP 06/02/2019   SpO2 100%   BMI 29.14 kg/m  FHR : baseline 135 / variability moderate / accelerations present / absent decelerations Toco: contractions every 2-5 minutes  Membranes: SROM (02/18/20 @ 0830) clear Dilation: 5.5 Effacement (%): 80 Cervical Position: Anterior Station: 0 Presentation: Vertex Exam by:: Kim Muscab Brenneman CNM Pitocin 14 mU/min  Assessment/Plan:   30 y.o. G1P0000 [redacted]w[redacted]d, GHTN, SROM and pitocin augmentation.  Labor: Progressing on pitocin GHTN labs stable Fetal Wellbeing:  Category I Pain Control:  Epidural I/D:  GBS negative Anticipated MOD:  NSVD  Domingo Pulse MSN, CNM 02/19/2020 1:33 AM

## 2020-02-19 NOTE — Progress Notes (Signed)
[name] was referred for history of depression/anxiety. * Referral screened out by Clinical Social Worker because none of the following criteria appear to apply: ~ History of anxiety/depression during this pregnancy, or of post-partum depression following prior delivery. ~ Diagnosis of anxiety and/or depression within last 3 years OR * [name]'s symptoms currently being treated with medication and/or therapy. Per further chart review it is noted that [name] is on Prozac and Lorazopam for  Anxiety/depression. (obsessive-compulsive disorder/anxiety (Dx 2019, on prozac 40mg , lorazopam 0.5mg  PRN)    Please contact the Clinical Social Worker if needs arise, by [facility] request, or if [name] scores greater than 9/yes to question 10 on Edinburgh Postpartum Depression Screen.   Michelle Waller, MSW, LCSW [facility] [place] at [place] [phone]

## 2020-02-19 NOTE — Progress Notes (Signed)
CSW received consult due to score 20 on Edinburgh Depression Screen.    CSW congratulated [name] and FOB on the birth of infant. CSW advised [name] of HIPPA policy in which [name] was agreeable to having CSW have FOB leave room. CSW then advised [name] of CSW's role and the reason for CSW coming to speak with her. [name] expressed that she has been dealing with a lot of feelings of guilt. [name] indicated that she didn't eat well while pregnant, went into preterm labor as well as had issues with blood pressure while pregnant. CSW assured [name] that at times these things can occur and be normal. [name] reported that she also was dealing with a ton of stress from her job therefore "I blamed myself for that as well a because they were not very supportive of me and my pregnancy at all". CSW offered support to [facility] as CSW noted that this was very hard for [name] to even reflect on. [name] reported to CSW that she has a long standing hx with OCD, and and depression. [name] reported that she was diagnosed at age 30 and has been on medications for mental health since. [name] expressed that she feels that her Prozac isn't working well at all. [name] expressed to CSW that it did work for her at one point and time however recently it hasn't been. CSW offered [name] to speak with [facility] and MD about this as [name] expressed a desire to change medications or get an increase in dosage but also wanting to be sure that it is safe for her to BF. [name] expressed that she would express this to MD. CSW was advised that [name] is expected to began therapy again on Monday 1/31/222 with a EACP.   [name] expressed that she was in therapy in the past however "my job wasn't very supportive and I wasn't able to get the days off that I needed in order to take care for myself". CSW understanding once more. [name] expressed that talking to other people make her feel better and that when she gets her emotions out she feels much better. [name] expressed that at times she can become so overwhelmed that her  chest began's to tighten in which [name] expressed occurring today. [name] was advised by CSW to try and practice Mindfulness Techniques to ground self. [name] expressed that she was interested in the idea, therefore CSW and [name] dicussed way that [name] may be able to ground herself during or before a panic attack. [name] expressed that she has no other known mental health hx and denies SI, HI and DV to this CSW when asked. CSW ask [name] about scoring 1 on question 10 in which [name] reported "no its not that I have ihad thoughts of harming myself, but I have thought that if something occurs then I am okay with it". [name] was unable to explaine to CSW what causes these feelings aside from at times she becomes overwhelmed and is unable to "shut my thinking off". CSW was advised that [name] has been loosing sleep due to overthinking but also due to pregnancy overall. [name] expressed to CSW that she has supports that usually help her through her mental health.   CSW inquired from [facility] on who her supports were in which [name] expressed her spouse, mother and best friends. [name] expressed that she is very excited that infant is here and that she does feel attached and bonded to infant. [name] disclosed that she has a desire to do thinks perfect.  CSW again expressed to [name] to be gentle with self and know that [name] and infant are both learning new skills to ensure that needs are met. [name] appeared to be very grateful in getting information from [place]. [name] expressed that she has all needed items to care for infant with plans for infant to sleep in basinet once arrived home. [name] was provided with PPD and SIDS education. [name] was given PPD Checklist in order to keep track of her feelings as they may relate to PPD. [name] thanked CSW an expressed that she feels better since being able to expressed self. CSW advised [name] that CSW would follow up with [name] on April 10, 2020 to see hoe she is feelings and doing. [name] tanked CSW and expressed no other needs at this time.       Virgie Dad Kayron Kalmar, MSW, LCSW [facility] and [place] at [place] [phone]

## 2020-02-19 NOTE — Anesthesia Postprocedure Evaluation (Signed)
Anesthesia Post Note  Patient: [employer]  Procedure(s) Performed: AN AD HOC LABOR EPIDURAL     Patient location during evaluation: Mother Baby Anesthesia Type: Epidural Level of consciousness: awake Pain management: satisfactory to patient Vital Signs Assessment: post-procedure vital signs reviewed and stable Respiratory status: spontaneous breathing Cardiovascular status: stable Anesthetic complications: no   No complications documented.  Last Vitals:  Vitals:   02/19/20 0629 02/19/20 1017  BP: (!) 147/100 126/77  Pulse: 91 73  Resp:  18  Temp:    SpO2: 100% 100%    Last Pain:  Vitals:   02/19/20 1205  TempSrc:   PainSc: 2    Pain Goal:                   [employer]

## 2020-02-19 NOTE — Lactation Note (Signed)
This note was copied from a baby's chart. Lactation Consultation Note  Patient Name: Michelle Waller Date: 02/19/2020 Reason for consult: Follow-up assessment Age:30 hours  Follow up to 40hours old infant. Infant is sleeping in basinet. Mother states she will be feeding infant soon  "it is about time". Talked to mother about hand expression , mother is experienced with technique. Offered colostrum vials to collect colostrum. Per parents, infant has been having good output.  Reviewed with mother average size of a NB stomach. Talked about infant's hunger and fullness cues.  Discussed milk coming to volume. Reviewed newborn behavior and expectations during first days of life.   Plan: 1-Breastfeeding on demand, ensuring a deep, comfortable latch. 2-Offer breast 8-12 times in 24h period to establish good milk supply. 3-Undressing infant and place skin to skin when ready to breastfeed 4-Keep infant awake during breastfeeding session: massaging breast, infant's hand/shoulder/feet 5-Monitor voids and stools as signs good intake. 6-Encouraged maternal rest, hydration and food intake. 7-Contact LC as needed for feeds/support/concerns/questions  All questions answered at this time.Provided LPTI green sheet for parents review.   Maternal Data Formula Feeding for Exclusion: Yes Reason for exclusion: Mother's choice to formula and breast feed on admission Has patient been taught Hand Expression?: Yes  Feeding Feeding Type: Breast Fed  Interventions Interventions: Breast feeding basics reviewed;Skin to skin;Expressed milk  Consult Status Consult Status: Follow-up Date: 02/20/20 Follow-up type: In-patient    Jaylina Ramdass A Higuera Ancidey 02/19/2020, 11:29 PM

## 2020-02-20 ENCOUNTER — Other Ambulatory Visit (HOSPITAL_COMMUNITY): Payer: Self-pay | Admitting: Psychiatry

## 2020-02-20 NOTE — Progress Notes (Signed)
CSW followed up with [name] at bedside. CSW was notified that [name] is feeling better and has no concerns  at this time.    Virgie Dad Lauria Depoy, MSW, LCSW [facility] and [place] at [place] [phone]

## 2020-02-20 NOTE — Progress Notes (Addendum)
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   Patient up ad lib, denies syncope or dizziness. Reports consuming regular diet without issues and denies N/V. Patient reports 0 bowel movement + passing flatus.  Denies issues with urination and reports bleeding is "lighter."  Patient is breastfeeding and reports going well.  Desires undecided for postpartum contraception.  Pain is being appropriately managed with use of po meds. Denies HA, RUQ pain or vision changes. Denies s/sx of hypothyroidism. Pt mood is stable now, was report she had a panic attack yesterday and took atarax which helped. Denies SI/HI. Discussed option of prozac and weather to d/c, switch or continue with additive, pt endorses that prozac did work for her but felt like it stopped working towards the end of the pregnancy, pt endorses feeling good now just scared and feels overwhelmed at times, pt is stable now, Discussed additivities such as buspar or Wellbutrin for anxiety, although cautioned on increase of anxiety when starting medication, discusses weaning prozac to 20mg  if pt psych going to switch, but due to prozac long half life no weaning is necessary. Pt verbalized desires to discuss with her psychiatrist which she will  call today for further guidances, pt also endorses having therapist appointment on 02/23/2020.   Right labial laceration Feeding:  Breast Contraceptive plan:  undecided Baby female.   Objective: Vital signs in last 24 hours: Patient Vitals for the past 24 hrs:  BP Temp Temp src Pulse Resp SpO2  02/20/20 0520 120/73 - - 62 - -  02/19/20 1940 135/88 - - 91 - 99 %  02/19/20 1510 116/61 98.7 F (37.1 C) Oral 78 - 100 %  02/19/20 1017 126/77 - - 73 18 100 %     Physical Exam:  General: alert, cooperative, appears stated age and no distress Mood/Affect: Happy/ANxious  Lungs: clear to auscultation, no wheezes, rales or rhonchi, symmetric air entry.  Heart: normal rate, regular rhythm, normal S1, S2, no murmurs, rubs, clicks or gallops. Breast: breasts appear normal, no suspicious masses, no skin or nipple changes or axillary nodes. Abdomen:  + bowel sounds, soft, non-tender GU: perineum intact, healing well. No signs of external hematomas.  Uterine Fundus: firm Lochia: appropriate Skin: Warm, Dry. DVT Evaluation: No evidence of DVT seen on physical exam. Negative Homan's sign. No cords or calf tenderness. No significant calf/ankle edema.  CBC Latest Ref Rng & Units 02/19/2020 02/18/2020 01/31/2020  WBC 4.0 - 10.5 K/uL 10.8(H) 5.6 6.6  Hemoglobin 12.0 - 15.0 g/dL 11.1(L) 12.6 11.7(L)  Hematocrit 36.0 - 46.0 % 33.2(L) 37.9 34.7(L)  Platelets 150 - 400 K/uL 159 185 191    No results found for this or any previous visit (from the past 24 hour(s)).   CBG (last 3)  No results for input(s): GLUCAP in the last 72 hours.   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   Pt stable. -1 involution. breastfeeding. Hemodynamically stable with labor ebl of 145mls, right labial laceration, hgb drop of 11.7-11.1. GHTN: no meds required during stay BP currently 120/73, admitting labs PCR 0.13, cmp and cbc WNL. Hypothyroidism: admitting TSH 1.721, Free T4 0.90, stable on 54mcg levothyroxine. H/O anxiety/OCD: mood stable now, required atarax yesterday for panic attacked and feeling overwhelmed, denies SI/HI, continues on prozac 40mg  PO daily, will talk with psychiatrist today.   Plan: Continue other mgmt as ordered Hypothyroidism: Continue 78mcg levothyroxine  OCD/Anxiety: Continue fluoxetine 40mg  PO daily for now, talk with psychiatrist for medication regimen, therapist appointment on 02/23/2020. May take atarax for anxiety PRN.  GHTN: Monitor BP, no meds indicated at this time.  VTE prophylactics: Early ambulated as tolerates.  Pain control: Motrin/Tylenol PRN Education given regarding options for contraception, including barrier methods, injectable contraception, IUD placement, oral contraceptives.  Plan for discharge tomorrow, Breastfeeding, Lactation consult and Social Work consult   Dr. Alwyn Pea to be updated on patient status  [facility] NP-C, CNM 02/20/2020, 8:29 AM

## 2020-02-20 NOTE — Lactation Note (Signed)
This note was copied from a baby's chart. Lactation Consultation Note  Patient Name: Michelle Waller [id number] Date: 02/20/2020 Age:31 hours  Attempted LC visit to 14 hours old. Mother is not available at the time of visit.  LC will come back to room at another time as possible.   Feeding Feeding Type: Donor Breast Milk Nipple Type: Extra Slow Flow   [name] 02/20/2020, 5:08 PM

## 2020-02-20 NOTE — Lactation Note (Signed)
This note was copied from a baby's chart. Lactation Consultation Note  Patient Name: Michelle Waller [id number] Date: 02/20/2020 Reason for consult: Follow-up assessment Age:31 hours  Follow up visit to 66 hours old with 4.48% weight loss at the time of consult. Infant is latched to left breast, modified cradle upon arrival. LC observed shallow latch and sub-optimal position. Assisted with adjusting position, demonstrated neck support and back extension. Hand expressed prior to latch and praised mother for milk supply. Infant offered a big gape and latched with ease. Mother looks comfortable and infant is rhythmically suckling and swallowing. Mother denies pain or discomfort. Mother requests more donor milk to supplement after feeding. Infant has been been having good voids and stools, per mother. Mother reports pain when pumping. Noted wrong flange size. Brought 21 mm flanges.   All questions answered at this time.    Maternal Data Has patient been taught Hand Expression?: Yes Does the patient have breastfeeding experience prior to this delivery?: No  Feeding Feeding Type: Breast Fed Nipple Type: Extra Slow Flow  LATCH Score Latch: Grasps breast easily, tongue down, lips flanged, rhythmical sucking.  Audible Swallowing: Spontaneous and intermittent  Type of Nipple: Everted at rest and after stimulation  Comfort (Breast/Nipple): Filling, red/small blisters or bruises, mild/mod discomfort  Hold (Positioning): Assistance needed to correctly position infant at breast and maintain latch.  LATCH Score: 8  Interventions Interventions: Assisted with latch;Breast feeding basics reviewed;Hand express;Position options;Support pillows;Expressed milk;DEBP;Adjust position  Lactation Tools Discussed/Used     Consult Status Consult Status: Follow-up Date: 02/21/20 Follow-up type: In-patient    Madoc Holquin A Higuera Ancidey 02/20/2020, 5:53 PM

## 2020-02-20 NOTE — Lactation Note (Signed)
This note was copied from a baby's chart. Lactation Consultation Note  Patient Name: Girl Yumiko Alkins [id number] Date: 02/20/2020 Age:31 hours   Attempted LC visit to 48 hours old. Mother is not available at the time of visit.  LC will come back to room at another time as possible.     Feeding Feeding Type: Donor Breast Milk Nipple Type: Extra Slow Flow  Aviah Sorci A Higuera Ancidey 02/20/2020, 4:05 PM

## 2020-02-21 MED ORDER — ACETAMINOPHEN 325 MG PO TABS: 650.0000 mg | ORAL_TABLET | ORAL | Status: DC | PRN

## 2020-02-21 MED ORDER — IBUPROFEN 600 MG PO TABS: 600.0000 mg | ORAL_TABLET | Freq: Four times a day (QID) | ORAL | 0 refills | Status: DC

## 2020-02-21 NOTE — Discharge Summary (Signed)
Postpartum Discharge Summary  Date of Service updated 02/21/20    Patient Name: Michelle Waller DOB: 07/25/1990 MRN: [id number]  Date of admission: 02/18/2020 Delivery date:02/19/2020  Delivering provider: HOLSHOUSER, KIMBERLY B  Date of discharge: 02/21/2020  Admitting diagnosis: Normal labor and delivery [O80] Intrauterine pregnancy: [redacted]w[redacted]d     Secondary diagnosis:  Active Problems:   Normal labor and delivery  Additional problems:  Patient Active Problem List   Diagnosis Date Noted  . Normal labor and delivery 02/18/2020  . Seasonal and perennial allergic rhinitis 04/16/2019  . Palpitations 01/15/2019  . Nonallopathic lesion of lumbosacral region 10/02/2017  . Nonallopathic lesion of sacral region 10/02/2017  . Irritable bowel syndrome 06/21/2017  . Routine general medical examination at a health care facility 06/14/2017  . 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 Nonallopathic lesion of thoracic region 04/30/2017  . Nonallopathic lesion of rib cage 04/30/2017  . Nonallopathic lesion of cervical region 04/30/2017  . ANA positive 10/31/2016  . Anxiety 08/15/2016  . Hypothyroidism 02/03/2016       Discharge diagnosis: Term Pregnancy Delivered and Preeclampsia (mild)                                              Post partum procedures:none Augmentation: Pitocin Complications: None  Hospital course: Onset of Labor With Vaginal Delivery      30 y.o. yo G1P1001 at [redacted]w[redacted]d was admitted in Latent Labor on 02/18/2020. Patient had an uncomplicated labor course as follows:  Membrane Rupture Time/Date: 8:30 AM ,02/18/2020   Delivery Method:Vaginal, Spontaneous  Episiotomy: None  Lacerations:  Labial  Patient had an uncomplicated postpartum course.  She is ambulating, tolerating a regular diet, passing flatus, and urinating well. Patient is discharged home in stable condition on 02/21/20.  Newborn Data: Birth date:02/19/2020  Birth time:3:02 AM  Gender:Female  Living status:Living   Apgars:8 ,9  Weight:2455 g   Magnesium Sulfate received: No BMZ received: No Rhophylac:N/A MMR:N/A Transfusion:No  Physical exam  Vitals:   02/20/20 0520 02/20/20 1356 02/20/20 2010 02/21/20 0600  BP: 120/73 (!) 132/96 121/65 128/68  Pulse: 62 91 90 83  Resp:  18 18 18  Temp:  97.6 F (36.4 C) 98 F (36.7 C) 97.9 F (36.6 C)  TempSrc:  Oral Oral   SpO2:  100% 100% 100%  Weight:      Height:       General: alert, cooperative and no distress Lochia: appropriate Uterine Fundus: firm Incision: N/A DVT Evaluation: No evidence of DVT seen on physical exam. No cords or calf tenderness. No significant calf/ankle edema. Labs: Lab Results  Component Value Date   WBC 10.8 (H) 02/19/2020   HGB 11.1 (L) 02/19/2020   HCT 33.2 (L) 02/19/2020   MCV 92.2 02/19/2020   PLT 159 02/19/2020   CMP Latest Ref Rng & Units 02/18/2020  Glucose 70 - 99 mg/dL 75  BUN 6 - 20 mg/dL 7  Creatinine 0.44 - 1.00 mg/dL 0.60  Sodium 135 - 145 mmol/L 135  Potassium 3.5 - 5.1 mmol/L 3.6  Chloride 98 - 111 mmol/L 105  CO2 22 - 32 mmol/L 18(L)  Calcium 8.9 - 10.3 mg/dL 8.8(L)  Total Protein 6.5 - 8.1 g/dL 6.7  Total Bilirubin 0.3 - 1.2 mg/dL 0.8  Alkaline Phos 38 - 126 U/L 82  AST 15 - 41 U/L 25  ALT 0 - 44 U/L 10     Edinburgh Score: Edinburgh Postnatal Depression Scale Screening Tool 02/19/2020  I have been able to laugh and see the funny side of things. 0  I have looked forward with enjoyment to things. 2  I have blamed myself unnecessarily when things went wrong. 3  I have been anxious or worried for no good reason. 3  I have felt scared or panicky for no good reason. 3  Things have been getting on top of me. 3  I have been so unhappy that I have had difficulty sleeping. 1  I have felt sad or miserable. 2  I have been so unhappy that I have been crying. 2  The thought of harming myself has occurred to me. 1  Edinburgh Postnatal Depression Scale Total 20      After visit meds:   Allergies as of 02/21/2020      Reactions   Soy Allergy Anaphylaxis   Large amounts cause anaphylaxis Small amounts cause upset stomach   Medrol [methylprednisolone] Hives   Montelukast Sodium Other (See Comments)   Bad dreams, nightmares      Medication List    STOP taking these medications   prenatal multivitamin Tabs tablet     TAKE these medications   acetaminophen 325 MG tablet Commonly known as: Tylenol Take 2 tablets (650 mg total) by mouth every 4 (four) hours as needed (for pain scale < 4).   cetirizine 10 MG tablet Commonly known as: ZYRTEC Take 10 mg by mouth daily.   cholecalciferol 25 MCG (1000 UNIT) tablet Commonly known as: VITAMIN D3 Take 1,000 Units by mouth daily.   docusate sodium 100 MG capsule Commonly known as: COLACE Take 100 mg by mouth 2 (two) times daily.   ferrous sulfate 325 (65 FE) MG tablet Take 325 mg by mouth daily with breakfast.   ibuprofen 600 MG tablet Commonly known as: ADVIL Take 1 tablet (600 mg total) by mouth every 6 (six) hours.   levothyroxine 75 MCG tablet Commonly known as: SYNTHROID Take 75 mcg by mouth daily before breakfast. What changed: Another medication with the same name was removed. Continue taking this medication, and follow the directions you see here.   NexIUM 20 MG packet Generic drug: esomeprazole Take 20 mg by mouth daily before breakfast. Once daily   polyethylene glycol 17 g packet Commonly known as: MIRALAX / GLYCOLAX Take 17 g by mouth daily.   PRENATAL VITAMINS PO Take by mouth.   PROZAC PO Take 40 mg by mouth daily.   VITAMIN B 12 PO Place 1 tablet under the tongue daily.        Discharge home in stable condition Infant Feeding: Breast Infant Disposition:home with mother Discharge instruction: per After Visit Summary and Postpartum booklet. Activity: Advance as tolerated. Pelvic rest for 6 weeks.  Diet: routine diet Anticipated Birth Control: Natural Family Planning Postpartum  Appointment:6 weeks Additional Postpartum F/U: Postpartum Depression checkup and BP check 1 week Future Appointments:No future appointments. Follow up Visit:  Follow-up Information    [facility]. Schedule an appointment as soon as possible for a visit in 6 week(s).   Specialty: Obstetrics and Gynecology Contact information: [address] [place] [address] [phone]                  02/21/2020 Vivian B Grice, CNM  

## 2020-02-21 NOTE — Lactation Note (Signed)
This note was copied from a baby's chart. Lactation Consultation Note  Patient Name: Michelle Waller [id number] Date: 02/21/2020 Reason for consult: Follow-up assessment Age:31 hours   P1 mother whose infant is now 64 hours old.  This is an ETI at 37+3 weeks.  Mother's feeding preference is breast.  She is supplementing with donor breast milk due to early gestation and baby weighs < 6 lbs.  Mother was happy to see me this morning and was interested in having assistance with latching.  She can latch baby easily to the left breast but informed me that she struggles with the right breast.  Mother interested in trying the football hold.  Positioned mother appropriately and she hand expressed a few drops of milk.  I observed mother latching without my assistance at first and provided helpful hints with latching after observing mother.  Suggestions and guidance provided and mother was able to latch baby to the right breast.  Baby pushed back at first, however, with continued attempts she stayed latched and fed for 16 minutes.  Continued with basic teaching while observing baby feeding.  Mother very receptive to learning and knowledgeable.  Father very supportive and helpful.  Mother supplemented with 35 mls of donor breast milk.  Baby is a very eager feeder and does a good job with her supplementation.  Reviewed paced bottle feeding.  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  Paperwork completed and a copy of the receipt given to the parents.  Mother has our OP phone number for any questions/concerns after discharge.  I feel comfortable with discharging this family.   Maternal Data    Feeding Feeding Type: Breast Fed  LATCH Score Latch: Repeated attempts needed to sustain latch, nipple held in mouth throughout feeding, stimulation needed to elicit sucking reflex.  Audible Swallowing: A few with stimulation  Type of Nipple: Everted at rest and after stimulation  Comfort  (Breast/Nipple): Soft / non-tender  Hold (Positioning): Assistance needed to correctly position infant at breast and maintain latch.  LATCH Score: 7  Interventions Interventions: Breast feeding basics reviewed;Assisted with latch;Skin to skin;Breast massage;Hand express;Breast compression;Adjust position;DEBP;Hand pump;Coconut oil;Position options;Support pillows  Lactation Tools Discussed/Used     Consult Status Consult Status: Complete Date: 02/21/20 Follow-up type: Call as needed    Ronel Rodeheaver R Joran Kallal 02/21/2020, 10:45 AM

## 2020-02-23 MED FILL — FLUoxetine HCL 20 MG TABS: 20 | 30 days supply | Qty: 90 | Fill #0

## 2020-02-24 ENCOUNTER — Encounter (HOSPITAL_COMMUNITY): Payer: No Typology Code available for payment source

## 2020-02-24 ENCOUNTER — Inpatient Hospital Stay (HOSPITAL_COMMUNITY): Payer: No Typology Code available for payment source

## 2020-02-24 ENCOUNTER — Inpatient Hospital Stay (HOSPITAL_COMMUNITY)
Admission: AD | Admit: 2020-02-24 | Payer: No Typology Code available for payment source | Source: Home / Self Care | Admitting: Obstetrics and Gynecology

## 2020-02-24 ENCOUNTER — Other Ambulatory Visit (HOSPITAL_COMMUNITY): Payer: Self-pay | Admitting: Psychiatry

## 2020-03-03 ENCOUNTER — Encounter: Payer: Self-pay | Admitting: Internal Medicine

## 2020-03-03 ENCOUNTER — Other Ambulatory Visit: Payer: Self-pay

## 2020-03-03 ENCOUNTER — Ambulatory Visit (INDEPENDENT_AMBULATORY_CARE_PROVIDER_SITE_OTHER): Payer: No Typology Code available for payment source | Admitting: Internal Medicine

## 2020-03-03 VITALS — BP 108/70 | HR 89 | Temp 98.4°F | Resp 18 | Ht 62.0 in | Wt 139.6 lb

## 2020-03-03 DIAGNOSIS — F419 Anxiety disorder, unspecified: Secondary | ICD-10-CM | POA: Diagnosis not present

## 2020-03-03 DIAGNOSIS — R6889 Other general symptoms and signs: Secondary | ICD-10-CM

## 2020-03-03 DIAGNOSIS — E039 Hypothyroidism, unspecified: Secondary | ICD-10-CM | POA: Diagnosis not present

## 2020-03-03 LAB — T4, FREE: Free T4: 1.34 ng/dL (ref 0.60–1.60)

## 2020-03-03 LAB — TSH: TSH: 0.29 u[IU]/mL — ABNORMAL LOW (ref 0.35–4.50)

## 2020-03-03 NOTE — Patient Instructions (Signed)
We will check the labs today and have you come back in about a month for the blood pressure check.

## 2020-03-03 NOTE — Progress Notes (Signed)
   Subjective:   Patient ID: Michelle Waller, female    DOB: 1990-03-19, 30 y.o.   MRN: [id number]  HPI The patient is a 30 YO postpartum female coming in for several concerns including elevated BP toward the end of pregnancy (she felt monitoring was not appropriate, no pre-eclampsia and no protein in the urine, no excessive swelling, BP elevated noticeably after delivery and started on BP medication labetalol, feeling better from that now, having some lightheadedness not sure if this is lack of sleep or related to BP), and anxiety (switched from prozac to celexa recently and has not had enough time to see if this will help, is stopping breastfeeding due to the interruption to sleep and sanity, overall is dealing okay with [facility] but a lot of adjustments, supportive husband who is helping with burden of care), and thyroid (dosing was increased during pregnancy, had monitoring in the hospital during stay, is having some temp intolerances not sure if this is hormonal or thyroid).   Review of Systems  Constitutional: Positive for activity change and fatigue.  HENT: Negative.   Eyes: Negative.   Respiratory: Negative for cough, chest tightness and shortness of breath.   Cardiovascular: Negative for chest pain, palpitations and leg swelling.  Gastrointestinal: Negative for abdominal distention, abdominal pain, constipation, diarrhea, nausea and vomiting.  Musculoskeletal: Negative.   Skin: Negative.   Neurological: Negative.   Psychiatric/Behavioral: Positive for decreased concentration, dysphoric mood and sleep disturbance. The patient is nervous/anxious.     Objective:  Physical Exam Constitutional:      Appearance: She is well-developed and well-nourished.  HENT:     Head: Normocephalic and atraumatic.  Eyes:     Extraocular Movements: EOM normal.  Cardiovascular:     Rate and Rhythm: Normal rate and regular rhythm.  Pulmonary:     Effort: Pulmonary effort is normal. No  respiratory distress.     Breath sounds: Normal breath sounds. No wheezing or rales.  Abdominal:     General: Bowel sounds are normal. There is no distension.     Palpations: Abdomen is soft.     Tenderness: There is no abdominal tenderness. There is no rebound.  Musculoskeletal:        General: No edema.     Cervical back: Normal range of motion.  Skin:    General: Skin is warm and dry.  Neurological:     Mental Status: She is alert and oriented to person, place, and time.     Coordination: Coordination normal.  Psychiatric:        Mood and Affect: Mood and affect normal.     Comments: Mood appropriate to situation     Vitals:   03/03/20 0919  BP: 108/70  Pulse: 89  Resp: 18  Temp: 98.4 F (36.9 C)  TempSrc: Oral  SpO2: 98%  Weight: 139 lb 9.6 oz (63.3 kg)  Height: 5\' 2"  (1.575 m)    This visit occurred during the SARS-CoV-2 public health emergency.  Safety protocols were in place, including screening questions prior to the visit, additional usage of staff PPE, and extensive cleaning of exam room while observing appropriate contact time as indicated for disinfecting solutions.   Assessment & Plan:

## 2020-03-05 DIAGNOSIS — R6889 Other general symptoms and signs: Secondary | ICD-10-CM | POA: Insufficient documentation

## 2020-03-05 NOTE — Assessment & Plan Note (Signed)
Elevated at the end of pregnancy and now taking labetalol after delivery. We talked about risk of low BP as her BP normalizes and signs and symptoms of this. We will have her return in 1 month for monitoring and may need dosage adjustment or cessation at that time.

## 2020-03-05 NOTE — Assessment & Plan Note (Signed)
We discussed monitoring in about 2-4 weeks from now to assess if dosing will need to be decreased. Labs were drawn today as part of standing orders and at top of normal. Can have option of reducing dosing or just monitoring at next month's visit. Taking 75 mcg daily synthroid currently.

## 2020-03-05 NOTE — Assessment & Plan Note (Signed)
Medications have been adjusted and we talked about how [facility] is very stressful and taxing. We talked about signs of post partum depression and how to handle and monitor and when to seek care. She does have a supportive husband which is beneficial. She will be stopping breastfeeding which should allow more periods of sleep which was advised.

## 2020-03-09 ENCOUNTER — Other Ambulatory Visit: Payer: Self-pay

## 2020-03-09 ENCOUNTER — Other Ambulatory Visit: Payer: Self-pay | Admitting: Internal Medicine

## 2020-03-09 ENCOUNTER — Other Ambulatory Visit (INDEPENDENT_AMBULATORY_CARE_PROVIDER_SITE_OTHER): Payer: No Typology Code available for payment source

## 2020-03-09 DIAGNOSIS — R768 Other specified abnormal immunological findings in serum: Secondary | ICD-10-CM | POA: Diagnosis not present

## 2020-03-10 ENCOUNTER — Encounter: Payer: Self-pay | Admitting: Internal Medicine

## 2020-03-10 LAB — CBC
HCT: 38.5 % (ref 36.0–46.0)
Hemoglobin: 13 g/dL (ref 12.0–15.0)
MCHC: 33.8 g/dL (ref 30.0–36.0)
MCV: 93.6 fl (ref 78.0–100.0)
Platelets: 212 10*3/uL (ref 150.0–400.0)
RBC: 4.11 Mil/uL (ref 3.87–5.11)
RDW: 12.2 % (ref 11.5–15.5)
WBC: 4.8 10*3/uL (ref 4.0–10.5)

## 2020-03-10 LAB — COMPREHENSIVE METABOLIC PANEL WITH GFR
ALT: 31 U/L (ref 0–35)
AST: 27 U/L (ref 0–37)
Albumin: 4.1 g/dL (ref 3.5–5.2)
Alkaline Phosphatase: 63 U/L (ref 39–117)
BUN: 14 mg/dL (ref 6–23)
CO2: 30 meq/L (ref 19–32)
Calcium: 9.8 mg/dL (ref 8.4–10.5)
Chloride: 102 meq/L (ref 96–112)
Creatinine, Ser: 0.73 mg/dL (ref 0.40–1.20)
GFR: 110.03 mL/min
Glucose, Bld: 126 mg/dL — ABNORMAL HIGH (ref 70–99)
Potassium: 4.2 meq/L (ref 3.5–5.1)
Sodium: 141 meq/L (ref 135–145)
Total Bilirubin: 0.2 mg/dL (ref 0.2–1.2)
Total Protein: 7.2 g/dL (ref 6.0–8.3)

## 2020-03-10 LAB — SEDIMENTATION RATE: Sed Rate: 13 mm/h (ref 0–20)

## 2020-03-11 LAB — ANTI-NUCLEAR AB-TITER (ANA TITER): ANA Titer 1: 1:80 {titer} — ABNORMAL HIGH

## 2020-03-11 LAB — ANA, IFA COMPREHENSIVE PANEL
Anti Nuclear Antibody (ANA): POSITIVE — AB
ENA SM Ab Ser-aCnc: <1 AI
SM/RNP: <1 AI
SSA (Ro) (ENA) Antibody, IgG: >8 AI — AB
SSB (La) (ENA) Antibody, IgG: <1 AI
Scleroderma (Scl-70) (ENA) Antibody, IgG: <1 AI
ds DNA Ab: 1 IU/mL

## 2020-03-13 ENCOUNTER — Encounter: Payer: Self-pay | Admitting: Internal Medicine

## 2020-03-30 ENCOUNTER — Ambulatory Visit (INDEPENDENT_AMBULATORY_CARE_PROVIDER_SITE_OTHER): Payer: No Typology Code available for payment source | Admitting: Internal Medicine

## 2020-03-30 ENCOUNTER — Encounter: Payer: Self-pay | Admitting: Internal Medicine

## 2020-03-30 ENCOUNTER — Other Ambulatory Visit: Payer: Self-pay

## 2020-03-30 VITALS — BP 124/64 | HR 76 | Temp 98.0°F | Resp 18 | Ht 62.0 in | Wt 140.0 lb

## 2020-03-30 DIAGNOSIS — R6889 Other general symptoms and signs: Secondary | ICD-10-CM | POA: Diagnosis not present

## 2020-03-30 DIAGNOSIS — E039 Hypothyroidism, unspecified: Secondary | ICD-10-CM

## 2020-03-30 DIAGNOSIS — F419 Anxiety disorder, unspecified: Secondary | ICD-10-CM | POA: Diagnosis not present

## 2020-03-30 MED ORDER — LEVOTHYROXINE SODIUM 50 MCG PO TABS: 50.0000 ug | ORAL_TABLET | Freq: Every day | ORAL | 3 refills | Status: DC

## 2020-03-30 NOTE — Patient Instructions (Addendum)
We will recheck the thyroid levels about a month from now.  If you start having any lightheaded or dizzy symptoms or passing out stop taking the blood pressure medicine or cut dose in half and let us know.

## 2020-03-30 NOTE — Progress Notes (Signed)
   Subjective:   Patient ID: Michelle Waller, female    DOB: [birth year], 30 y.o.   MRN: [id number]  HPI The patient is a 30 YO female coming in for follow up blood pressure (tried to stop labetalol 200 mg BID and this caused more palpitations and BP to elevate, she is doing well on this, rare low BP but not symptomatic, denies headaches or chest pain), and thyroid (has reduced dosing to 50 mcg and feeling better, much less anxious and less palpitations since changes), and anxiety (she feels she is settling into parenthood, overall sleeping better and sharing tasks with spouse, she has stopped breastfeeding which has helped her emotional health, taking celexa and overall satisfied with control).   Review of Systems  Constitutional: Positive for fatigue.  HENT: Negative.   Eyes: Negative.   Respiratory: Negative for cough, chest tightness and shortness of breath.   Cardiovascular: Positive for palpitations. Negative for chest pain and leg swelling.  Gastrointestinal: Negative for abdominal distention, abdominal pain, constipation, diarrhea, nausea and vomiting.  Musculoskeletal: Negative.   Skin: Negative.   Neurological: Negative.   Psychiatric/Behavioral: Negative.     Objective:  Physical Exam Constitutional:      Appearance: She is well-developed.  HENT:     Head: Normocephalic and atraumatic.  Cardiovascular:     Rate and Rhythm: Normal rate and regular rhythm.  Pulmonary:     Effort: Pulmonary effort is normal. No respiratory distress.     Breath sounds: Normal breath sounds. No wheezing or rales.  Abdominal:     General: Bowel sounds are normal. There is no distension.     Palpations: Abdomen is soft.     Tenderness: There is no abdominal tenderness. There is no rebound.  Musculoskeletal:     Cervical back: Normal range of motion.  Skin:    General: Skin is warm and dry.  Neurological:     Mental Status: She is alert and oriented to person, place, and time.      Coordination: Coordination normal.     Vitals:   03/30/20 1302  BP: 124/64  Pulse: 76  Resp: 18  Temp: 98 F (36.7 C)  TempSrc: Oral  SpO2: 99%  Weight: 140 lb (63.5 kg)  Height: 5\' 2"  (1.575 m)    This visit occurred during the SARS-CoV-2 public health emergency.  Safety protocols were in place, including screening questions prior to the visit, additional usage of staff PPE, and extensive cleaning of exam room while observing appropriate contact time as indicated for disinfecting solutions.   Assessment & Plan:

## 2020-04-01 ENCOUNTER — Telehealth: Payer: Self-pay | Admitting: Internal Medicine

## 2020-04-01 ENCOUNTER — Other Ambulatory Visit: Payer: Self-pay | Admitting: Internal Medicine

## 2020-04-01 ENCOUNTER — Ambulatory Visit: Payer: No Typology Code available for payment source | Admitting: Internal Medicine

## 2020-04-01 MED ORDER — LABETALOL HCL 200 MG PO TABS: 200.0000 mg | ORAL_TABLET | Freq: Two times a day (BID) | ORAL | 6 refills | Status: DC

## 2020-04-01 NOTE — Telephone Encounter (Signed)
See below

## 2020-04-01 NOTE — Telephone Encounter (Signed)
Rx sent in

## 2020-04-01 NOTE — Telephone Encounter (Signed)
Patient called and was wondering if Dr. Sharlet Salina would be able to prescribe labetalol (NORMODYNE) 200 MG tablet. She said she will be out of the medication tonight. It can be sent to [id number] - [place], [place] - [address] Please advise   Patient is requesting a call if rx is sent

## 2020-04-02 ENCOUNTER — Encounter: Payer: Self-pay | Admitting: Internal Medicine

## 2020-04-02 DIAGNOSIS — R768 Other specified abnormal immunological findings in serum: Secondary | ICD-10-CM

## 2020-04-02 DIAGNOSIS — K589 Irritable bowel syndrome without diarrhea: Secondary | ICD-10-CM

## 2020-04-02 NOTE — Assessment & Plan Note (Signed)
Check TSH and free T4 in about 1-2 months since she has reduced to 50 mcg daily synthroid.

## 2020-04-02 NOTE — Telephone Encounter (Signed)
Medication was sent to the location as requested previously.

## 2020-04-02 NOTE — Assessment & Plan Note (Signed)
Overall doing well with counseling and celexa. Reduction of thyroid dosing has helped also.

## 2020-04-02 NOTE — Telephone Encounter (Signed)
Patients husband called and was wondering if labetalol (NORMODYNE) 200 MG tablet could be sent to [id number] - [place], [place] - [address] Please advise

## 2020-04-02 NOTE — Assessment & Plan Note (Signed)
Still with some relief with labetalol 200 mg BID which is refilled today. Will see back in 3 months and possibly try to stop at that time. We talked about symptoms associated with low BP and if she experiences those to call us and stop or reduce dosing sooner.

## 2020-04-06 ENCOUNTER — Ambulatory Visit: Payer: No Typology Code available for payment source | Admitting: Nurse Practitioner

## 2020-04-07 ENCOUNTER — Encounter: Payer: Self-pay | Admitting: Internal Medicine

## 2020-04-07 NOTE — Telephone Encounter (Signed)
[facility] calling, they said that because the patient is out of network for their Rheumatology we will either have to refer her to somewhere in network or request a special out of network request Phone [phone]

## 2020-04-07 NOTE — Telephone Encounter (Signed)
error 

## 2020-04-07 NOTE — Telephone Encounter (Signed)
See below

## 2020-04-08 ENCOUNTER — Ambulatory Visit (INDEPENDENT_AMBULATORY_CARE_PROVIDER_SITE_OTHER): Payer: No Typology Code available for payment source | Admitting: Obstetrics

## 2020-04-08 ENCOUNTER — Other Ambulatory Visit (HOSPITAL_COMMUNITY)
Admission: RE | Admit: 2020-04-08 | Discharge: 2020-04-08 | Disposition: A | Discharge status: Home / Self Care | Payer: No Typology Code available for payment source | Source: Ambulatory Visit | Attending: Obstetrics | Admitting: Obstetrics

## 2020-04-08 ENCOUNTER — Other Ambulatory Visit: Payer: Self-pay

## 2020-04-08 ENCOUNTER — Encounter: Payer: Self-pay | Admitting: Obstetrics

## 2020-04-08 VITALS — BP 120/60 | Ht 62.0 in | Wt 146.0 lb

## 2020-04-08 DIAGNOSIS — Z124 Encounter for screening for malignant neoplasm of cervix: Secondary | ICD-10-CM

## 2020-04-08 NOTE — Progress Notes (Signed)
Postpartum Visit  Chief Complaint:  Chief Complaint  Patient presents with  . Postpartum Care    Vulva dryness x 3 weeks    History of Present Illness: Patient is a 30 y.o. G1P1001 presents for postpartum visit. Michelle Waller delivered vaginally at [facility] on 02/19/2020, a baby girl, "Estill Bamberg" at 37 weeks 5 days. Her labor was spontaneous with SROM, and was augmented with pitocin. With extreme anxiety and OCD sxs which she shares, were not well addressed at the [facility] office. She went to [place] for assistance. She is now doing remarkably better; is on a different SSRI, and is feeling well. She decided to have her PP physical done at the [facility] office rather than return to her previous OB.  Date of delivery: 02/19/2020 Type of delivery: Vaginal delivery - Vacuum or forceps assisted  no Episiotomy No.  Laceration: right labial laceration- unrepaired; perineum intact  Pregnancy or labor problems:  Yes Gestational HTN,  Any problems since the delivery:  Yes- treated for OCD sxs and anxiety. Newborn Details:  SINGLETON :  1. 31 name: Estill Bamberg. Birth weight: 5+ lbs Maternal Details:  Breast Feeding:  no Post partum depression/anxiety noted:  yes Edinburgh Post-Partum Depression Score:  5 ( was 20 at the hospital) Date of last PAP: 11/2019  Abnormal- LGSIL. She requests Pa today   Past Medical History:  Diagnosis Date  . Allergy   . Anxiety   . Asthma    physical induced asthma   . Eating disorder   . Family history of adverse reaction to anesthesia   . Frequent headaches   . Gastritis   . GERD (gastroesophageal reflux disease)   . Hiatal hernia   . Hypothyroid   . IBS (irritable bowel syndrome)   . Kidney stones   . Obsessive compulsive disorder   . Ovarian cyst   . PONV (postoperative nausea and vomiting)   . Pregnancy induced hypertension   . Sjogren's disease ([place])   . Sjogren's syndrome ([place])   . Thyroid disease   . UTI (urinary tract infection)   . Vaginal Pap  smear, abnormal   . Vision abnormalities     Past Surgical History:  Procedure Laterality Date  . ADENOIDECTOMY    . CERVIX LESION DESTRUCTION    . COLONOSCOPY    . CYSTOSCOPY    . KIDNEY STONE SURGERY    . KNEE ARTHROSCOPY    . KNEE SURGERY      Prior to Admission medications   Medication Sig Start Date End Date Taking? Authorizing Provider  cetirizine (ZYRTEC) 10 MG tablet Take 10 mg by mouth daily.   Yes [provider]  citalopram (CELEXA) 10 MG tablet Take 15 mg by mouth daily. 03/27/20  Yes [provider]  Cyanocobalamin (VITAMIN B 12 PO) Place 1 tablet under the tongue daily.    Yes [provider]  esomeprazole (NEXIUM) 20 MG packet Take 20 mg by mouth daily before breakfast. Once daily   Yes [provider]  ferrous sulfate 325 (65 FE) MG tablet Take 325 mg by mouth every other day.   Yes [provider]  labetalol (NORMODYNE) 200 MG tablet Take 1 tablet (200 mg total) by mouth 2 (two) times daily. 04/01/20  Yes Hoyt Koch, MD  levothyroxine (SYNTHROID) 50 MCG tablet Take 1 tablet (50 mcg total) by mouth daily before breakfast. 03/30/20  Yes Hoyt Koch, MD  Prenatal Vit-Fe Fumarate-FA (PRENATAL VITAMINS PO) Take by mouth.   Yes [provider]  cholecalciferol (VITAMIN D3) 25 MCG (1000 UT) tablet Take 1,000 Units by mouth daily. Patient not taking: Reported on 04/08/2020    [provider]  albuterol (PROVENTIL HFA;VENTOLIN HFA) 108 (90 Base) MCG/ACT inhaler Inhale 2 puffs into the lungs every 4 (four) hours as needed for wheezing or shortness of breath. 11/19/17 07/05/19  Lyndal Pulley, DO  Fluticasone Propionate Truett Perna) 93 MCG/ACT EXHU Place 32 sprays into the nose in the morning and at bedtime. 05/27/19 07/05/19  Valentina Shaggy, MD  omeprazole (PRILOSEC) 40 MG capsule Take 1 capsule (40 mg total) by mouth daily. 06/20/19 09/04/19  Mauri Pole, MD  sucralfate (CARAFATE) 1 g tablet  Take 1 tablet (1 g total) by mouth 4 (four) times daily -  with meals and at bedtime. 06/20/19 07/05/19  Mauri Pole, MD    Allergies  Allergen Reactions  . Soy Allergy Anaphylaxis    Large amounts cause anaphylaxis Small amounts cause upset stomach  . Medrol [Methylprednisolone] Hives  . Montelukast Sodium Other (See Comments)    Bad dreams, nightmares     Social History   Socioeconomic History  . Marital status: Married    Spouse name: Larkin Ina  . Number of children: Not on file  . Years of education: Not on file  . Highest education level: Not on file  Occupational History  . Not on file  Tobacco Use  . Smoking status: Former Smoker    Quit date: 11/09/2011    Years since quitting: 8.4  . Smokeless tobacco: Never Used  Vaping Use  . Vaping Use: Never used  Substance and Sexual Activity  . Alcohol use: Not Currently    Comment: occasionally   . Drug use: Never  . Sexual activity: Not Currently    Birth control/protection: None    Comment: undecided  Other Topics Concern  . Not on file  Social History Narrative   ** Merged History Encounter **       Social Determinants of Health   Financial Resource Strain: Not on file  Food Insecurity: Not on file  Transportation Needs: Not on file  Physical Activity: Not on file  Stress: Not on file  Social Connections: Not on file  Intimate Partner Violence: Not on file    Family History  Problem Relation Age of Onset  . Thyroid disease Mother   . Hypertension Father   . Diabetes Father   . Thyroid disease Sister   . Cancer Paternal Grandmother        breast  . Diabetes Paternal Grandfather   . Heart disease Paternal Grandfather   . Depression Mother   . Atrial fibrillation Mother   . Colon polyps Father   . Arthritis Maternal Grandmother   . Heart disease Maternal Grandmother   . Hypertension Paternal Grandmother   . Hypertension Paternal Grandfather   . Diabetes Mellitus II Sister   . Hypothyroidism  Sister   . Colon cancer Neg Hx   . Esophageal cancer Neg Hx   . Rectal cancer Neg Hx   . Stomach cancer Neg Hx     Review of Systems  Constitutional: Positive for malaise/fatigue.  HENT: Positive for congestion.        Seasonal allergies. Take Zyrtec  Eyes: Negative.   Respiratory: Negative.   Cardiovascular: Negative.   Gastrointestinal: Negative.   Genitourinary: Negative.   Musculoskeletal: Negative.   Skin: Negative.   Neurological: Positive for headaches.       Ongoing occasional  headaches  Endo/Heme/Allergies: Negative.   Psychiatric/Behavioral: The patient is nervous/anxious.      Physical Exam BP 120/60   Ht 5\' 2"  (1.575 m)   Wt 146 lb (66.2 kg)   Breastfeeding No   BMI 26.70 kg/m   Physical Exam Constitutional:      Appearance: Normal appearance. She is normal weight.  Genitourinary:     Vulva normal.     Genitourinary Comments: No rashes, lesions noted externally. Right labial lacrationis not repaired; perineum is intact. [place] is anteverted, non enlarged. Normal vaginal mucosa and rugae. + vaginal tone noted with Kegel exercise.  HENT:     Head: Normocephalic and atraumatic.     Nose: Nose normal.     Mouth/Throat:     Mouth: Mucous membranes are dry.  Eyes:     Extraocular Movements: Extraocular movements intact.     Pupils: Pupils are equal, round, and reactive to light.  Cardiovascular:     Rate and Rhythm: Normal rate and regular rhythm.     Pulses: Normal pulses.     Heart sounds: Normal heart sounds.  Pulmonary:     Effort: Pulmonary effort is normal.     Breath sounds: Normal breath sounds.  Abdominal:     General: Abdomen is flat. Bowel sounds are normal.     Palpations: Abdomen is soft.  Musculoskeletal:        General: Normal range of motion.     Cervical back: Normal range of motion and neck supple.  Neurological:     General: No focal deficit present.     Mental Status: She is alert and oriented to person, place, and time.  Skin:     General: Skin is warm and dry.  Psychiatric:        Behavior: Behavior normal.     Comments: Edinburgh score is 5 today ( at [place] was a 20)  No Sis today. Using Celexa daily- "feeling good"      Female Chaperone present during breast and/or pelvic exam.  Assessment: 30 y.o. G1P1001 presenting for 6 week postpartum visit New patient to [facility] For pap smear Plan: Problem List Items Addressed This Visit   None      1) Contraception Education given regarding options for contraception, including Fertility Awareness/ NFP.  2)  Pap - ASCCP guidelines and rational discussed. A pap smear is done today in light of her LGSIL pap last year.  Patient opts for yearly screening interval  3) Patient underwent screening for postpartum depression with no concerns noted. She continues with routine Psych care and SSRI medication  4) Follow up 1 year for routine annual exam  Imagene Riches, CNM  04/08/2020 4:29 PM   04/08/2020 4:12 PM

## 2020-04-13 ENCOUNTER — Encounter: Payer: Self-pay | Admitting: Obstetrics

## 2020-04-13 LAB — CYTOLOGY - PAP
Comment: NEGATIVE
Diagnosis: NEGATIVE
High risk HPV: NEGATIVE

## 2020-05-07 ENCOUNTER — Ambulatory Visit: Payer: No Typology Code available for payment source | Attending: Internal Medicine

## 2020-05-07 ENCOUNTER — Other Ambulatory Visit: Payer: Self-pay

## 2020-05-07 DIAGNOSIS — Z23 Encounter for immunization: Secondary | ICD-10-CM

## 2020-05-07 MED ORDER — PFIZER-BIONT COVID-19 VAC-TRIS 30 MCG/0.3ML IM SUSP
INTRAMUSCULAR | 0 refills | Status: DC
  Filled 2020-05-07: qty 0.3, 1d supply, fill #0

## 2020-05-07 NOTE — Progress Notes (Signed)
   Covid-19 Vaccination Clinic  Name:  Michelle Waller    MRN: [id number] DOB: 1990-07-20  05/07/2020  [name] was observed post Covid-19 immunization for 15 minutes without incident. She was provided with Vaccine Information Sheet and instruction to access the V-Safe system.   [name] was instructed to call 911 with any severe reactions post vaccine: Mar[place] Kitchen Difficulty breathing  . Swelling of face and throat  . A fast heartbeat  . A bad rash all over body  . Dizziness and weakness   Immunizations Administered    Name Date Dose VIS Date Route   PFIZER Comrnaty(Gray TOP) Covid-19 Vaccine 05/07/2020  2:16 PM 0.3 mL 01/01/2020 Intramuscular   Manufacturer: [place]   Lot: [id number]   [place]: [phone]

## 2020-05-11 ENCOUNTER — Encounter: Payer: Self-pay | Admitting: Urology

## 2020-05-11 ENCOUNTER — Ambulatory Visit
Admission: RE | Admit: 2020-05-11 | Discharge: 2020-05-11 | Disposition: A | Discharge status: Home / Self Care | Payer: No Typology Code available for payment source | Attending: Urology | Admitting: Urology

## 2020-05-11 ENCOUNTER — Other Ambulatory Visit: Payer: Self-pay

## 2020-05-11 ENCOUNTER — Ambulatory Visit (INDEPENDENT_AMBULATORY_CARE_PROVIDER_SITE_OTHER): Payer: Self-pay | Admitting: Urology

## 2020-05-11 ENCOUNTER — Ambulatory Visit
Admission: RE | Admit: 2020-05-11 | Discharge: 2020-05-11 | Disposition: A | Discharge status: Home / Self Care | Payer: No Typology Code available for payment source | Source: Ambulatory Visit | Attending: Urology | Admitting: Urology

## 2020-05-11 VITALS — BP 106/73 | HR 77 | Ht 62.0 in | Wt 149.0 lb

## 2020-05-11 DIAGNOSIS — N200 Calculus of kidney: Secondary | ICD-10-CM | POA: Insufficient documentation

## 2020-05-11 DIAGNOSIS — N3010 Interstitial cystitis (chronic) without hematuria: Secondary | ICD-10-CM

## 2020-05-11 NOTE — Progress Notes (Signed)
05/11/20 8:55 AM   [name] 1990-04-17 [id number]  CC: Nephrolithiasis, history of interstitial cystitis  HPI: I saw Ms. Dolman to establish care for the above issues.  She works in the [employer] in [place] with [employer].  She reports a history of at least 6 prior spontaneously passed kidney stones, as well as 1 requiring ureteroscopy and laser lithotripsy in [place] previously.  She recently was pregnant and felt like she may have passed a few small stones during her pregnancy.  She denies any gross hematuria or flank pain at this time.  Most recent imaging with renal ultrasound in July 2021 showed small stones bilaterally, but no hydronephrosis.  She has completed 2 prior 24-hour urine tests with [facility] in [place], but those results are not currently available to me.  She also has a history of interstitial cystitis, however denies any complaints related to this today.  She denies any bladder pain, pelvic pain, dysuria, urgency, frequency, or incontinence.   PMH: Past Medical History:  Diagnosis Date  . Allergy   . Anxiety   . Asthma    physical induced asthma   . Eating disorder   . Family history of adverse reaction to anesthesia   . Frequent headaches   . Gastritis   . GERD (gastroesophageal reflux disease)   . Hiatal hernia   . Hypothyroid   . IBS (irritable bowel syndrome)   . Kidney stones   . Obsessive compulsive disorder   . Ovarian cyst   . PONV (postoperative nausea and vomiting)   . Pregnancy induced hypertension   . Sjogren's disease ([place])   . Sjogren's syndrome ([place])   . Thyroid disease   . UTI (urinary tract infection)   . Vaginal Pap smear, abnormal   . Vision abnormalities     Surgical History: Past Surgical History:  Procedure Laterality Date  . ADENOIDECTOMY    . CERVIX LESION DESTRUCTION    . COLONOSCOPY    . CYSTOSCOPY    . KIDNEY STONE SURGERY    . KNEE ARTHROSCOPY    . KNEE SURGERY      Family  History: Family History  Problem Relation Age of Onset  . Thyroid disease Mother   . Hypertension Father   . Diabetes Father   . Thyroid disease Sister   . Cancer Paternal Grandmother        breast  . Diabetes Paternal Grandfather   . Heart disease Paternal Grandfather   . Depression Mother   . Atrial fibrillation Mother   . Colon polyps Father   . Arthritis Maternal Grandmother   . Heart disease Maternal Grandmother   . Hypertension Paternal Grandmother   . Hypertension Paternal Grandfather   . Diabetes Mellitus II Sister   . Hypothyroidism Sister   . Colon cancer Neg Hx   . Esophageal cancer Neg Hx   . Rectal cancer Neg Hx   . Stomach cancer Neg Hx     Social History:  reports that she quit smoking about 8 years ago. She has never used smokeless tobacco. She reports previous alcohol use. She reports that she does not use drugs.  Physical Exam: BP 106/73   Pulse 77   Ht 5\' 2"  (1.575 m)   Wt 149 lb (67.6 kg)   BMI 27.25 kg/m    Constitutional:  Alert and oriented, No acute distress. Cardiovascular: No clubbing, cyanosis, or edema. Respiratory: Normal respiratory effort, no increased work of breathing. GI: Abdomen is soft, nontender, nondistended, no  abdominal masses  Laboratory Data: Reviewed, see HPI  Pertinent Imaging: I have personally viewed and interpreted the renal ultrasound dated 08/14/2019 showing no hydronephrosis, and small stones bilaterally  Assessment & Plan:   30 year old female with numerous prior stone events, and 2 prior 24-hour urine tests(results unavailable at this time-we [facility] in [place]), as well as history of interstitial cystitis currently with no bladder symptoms.  We discussed general stone prevention strategies including adequate hydration with goal of producing 2.5 L of urine daily, increasing citric acid intake, increasing calcium intake during high oxalate meals, minimizing animal protein, and decreasing salt  intake. Information about dietary recommendations given today.   In terms of interstitial cystitis we discussed the relationship between diet and stress on the symptoms, and information regarding foods to avoid was provided.  KUB today to evaluate stone burden, call with results RTC 1 year with KUB prior  Nickolas Madrid, MD 05/11/2020  [facility] [address] [place], [place] [address] [phone]

## 2020-05-11 NOTE — Patient Instructions (Signed)
Textbook [employer] (5th ed., pp. 1518-1527.e3). [place], [place]: Elsevier.">  Dietary Guidelines to Help Prevent Kidney Stones Kidney stones are deposits of minerals and salts that form inside your kidneys. Your risk of developing kidney stones may be greater depending on your diet, your lifestyle, the medicines you take, and whether you have certain medical conditions. Most people can lower their chances of developing kidney stones by following the instructions below. Your dietitian may give you more specific instructions depending on your overall health and the type of kidney stones you tend to develop. What are tips for following this plan? Reading food labels  Choose foods with "no salt added" or "low-salt" labels. Limit your salt (sodium) intake to less than 1,500 mg a day.  Choose foods with calcium for each meal and snack. Try to eat about 300 mg of calcium at each meal. Foods that contain 200-500 mg of calcium a serving include: ? 8 oz (237 mL) of milk, calcium-fortifiednon-dairy milk, and calcium-fortifiedfruit juice. Calcium-fortified means that calcium has been added to these drinks. ? 8 oz (237 mL) of kefir, yogurt, and soy yogurt. ? 4 oz (114 g) of tofu. ? 1 oz (28 g) of cheese. ? 1 cup (150 g) of dried figs. ? 1 cup (91 g) of cooked broccoli. ? One 3 oz (85 g) can of sardines or mackerel. Most people need 1,000-1,500 mg of calcium a day. Talk to your dietitian about how much calcium is recommended for you.   Shopping  Buy plenty of fresh fruits and vegetables. Most people do not need to avoid fruits and vegetables, even if these foods contain nutrients that may contribute to kidney stones.  When shopping for convenience foods, choose: ? Whole pieces of fruit. ? Pre-made salads with dressing on the side. ? Low-fat fruit and yogurt smoothies.  Avoid buying frozen meals or prepared deli foods. These can be high in sodium.  Look for foods with live cultures, such as  yogurt and kefir.  Choose high-fiber grains, such as whole-wheat breads, oat bran, and wheat cereals. Cooking  Do not add salt to food when cooking. Place a salt shaker on the table and allow each person to add his or her own salt to taste.  Use vegetable protein, such as beans, textured vegetable protein (TVP), or tofu, instead of meat in pasta, casseroles, and soups. Meal planning  Eat less salt, if told by your dietitian. To do this: ? Avoid eating processed or pre-made food. ? Avoid eating fast food.  Eat less animal protein, including cheese, meat, poultry, or fish, if told by your dietitian. To do this: ? Limit the number of times you have meat, poultry, fish, or cheese each week. Eat a diet free of meat at least 2 days a week. ? Eat only one serving each day of meat, poultry, fish, or seafood. ? When you prepare animal protein, cut pieces into small portion sizes. For most meat and fish, one serving is about the size of the palm of your hand.  Eat at least five servings of fresh fruits and vegetables each day. To do this: ? Keep fruits and vegetables on hand for snacks. ? Eat one piece of fruit or a handful of berries with breakfast. ? Have a salad and fruit at lunch. ? Have two kinds of vegetables at dinner.  Limit foods that are high in a substance called oxalate. These include: ? Spinach (cooked), rhubarb, beets, sweet potatoes, and Swiss chard. ? Peanuts. ?   Potato chips, french fries, and baked potatoes with skin on. ? Nuts and nut products. ? Chocolate.  If you regularly take a diuretic medicine, make sure to eat at least 1 or 2 servings of fruits or vegetables that are high in potassium each day. These include: ? Avocado. ? Banana. ? Orange, prune, carrot, or tomato juice. ? Baked potato. ? Cabbage. ? Beans and split peas. Lifestyle  Drink enough fluid to keep your urine pale yellow. This is the most important thing you can do. Spread your fluid intake throughout  the day.  If you drink alcohol: ? Limit how much you use to:  0-1 drink a day for women who are not pregnant.  0-2 drinks a day for men. ? Be aware of how much alcohol is in your drink. In the [place] one drink equals one 12 oz bottle of beer (355 mL), one 5 oz glass of wine (148 mL), or one 1 oz glass of hard liquor (44 mL).  Lose weight if told by your health care provider. Work with your dietitian to find an eating plan and weight loss strategies that work best for you.   General information  Talk to your health care provider and dietitian about taking daily supplements. You may be told the following depending on your health and the cause of your kidney stones: ? Not to take supplements with vitamin C. ? To take a calcium supplement. ? To take a daily probiotic supplement. ? To take other supplements such as magnesium, fish oil, or vitamin B6.  Take over-the-counter and prescription medicines only as told by your health care provider. These include supplements. What foods should I limit? Limit your intake of the following foods, or eat them as told by your dietitian. Vegetables Spinach. Rhubarb. Beets. Canned vegetables. Angie Fava. Olives. Baked potatoes with skin. Grains Wheat bran. Baked goods. Salted crackers. Cereals high in sugar. Meats and other proteins Nuts. Nut butters. Large portions of meat, poultry, or fish. Salted, precooked, or cured meats, such as sausages, meat loaves, and hot dogs. Dairy Cheese. Beverages Regular soft drinks. Regular vegetable juice. Seasonings and condiments Seasoning blends with salt. Salad dressings. Soy sauce. Ketchup. Barbecue sauce. Other foods Canned soups. Canned pasta sauce. Casseroles. Pizza. Lasagna. Frozen meals. Potato chips. [place] fries. The items listed above may not be a complete list of foods and beverages you should limit. Contact a dietitian for more information. What foods should I avoid? Talk to your dietitian about  specific foods you should avoid based on the type of kidney stones you have and your overall health. Fruits Grapefruit. The item listed above may not be a complete list of foods and beverages you should avoid. Contact a dietitian for more information. Summary  Kidney stones are deposits of minerals and salts that form inside your kidneys.  You can lower your risk of kidney stones by making changes to your diet.  The most important thing you can do is drink enough fluid. Drink enough fluid to keep your urine pale yellow.  Talk to your dietitian about how much calcium you should have each day, and eat less salt and animal protein as told by your dietitian. This information is not intended to replace advice given to you by your health care provider. Make sure you discuss any questions you have with your health care provider. Document Revised: 01/02/2019 Document Reviewed: 01/02/2019 Elsevier Patient Education  2021 [place].   IC/BPS Flare Fact Sheet  Interstitial cystitis patients often struggle  with "flares," a sudden and dramatic worsening of their bladder symptoms. Lasting from hours to weeks, flares can be unpredictable, disruptive and difficult to manage for newly diagnosed and veteran IC patients. Bladder wall irritation is the most common type of flare, often occurring after patients eat acidic foods, are under high stress or struggling with hormone changes. Pelvic floor muscle tension can also drive flares, particularly when those muscles are provoked through riding in a car or sexual intimacy. The good news is that flares are generally short term. They do, however, require prompt intervention to reduce/avoid the trigger and minimize the duration of the flare.  Typical Flare Triggers  DIET - 95% of patients report that certain foods exacerbate their IC symptoms. The most common offenders are foods high in acid, caffeine and alcohol.  DRIVING - 45% of IC patients report  pain and discomfort while riding in a car, train, airplane or on a motorcycle. Patients are encouraged to avoid long rides until their condition has improved.  STRESS & ANXIETY - High periods of physical or emotional stress can exacerbate symptoms, including periods of intense cold, heat and emotional distress.  SEX & INTIMACY - Men with IC may experience pain at the moment of orgasm while women often feel their worst 24-48 hours after intercourse, the result of pelvic floor muscle spasms.  EXERCISE - Exercise that jars or puts pressure on the pelvic floor (i.e. riding a bicycle or motorcycle, spinning, running or stairs) can exacerbate pelvic pain and bladder symptoms. Exercises that keep the hips level are ideal, such as: walking, elliptical, rowing, gentle yoga and/or pilates.  HORMONES - Many women struggle with short term flares during ovulation and a few days before their period. Ironically, some patients flare when their progesterone levels are higher, while others flare when their estrogen levels are higher. Post-menopausal women may experience more bladder, urethral and vulvar sensitivity due to dryness of the skin. Using a compounded, preservative-free estrogen cream may help patients with estrogen atrophy.  CHEMICAL EXPOSURE - Chemical exposures can trigger an IC flare, including: scented candles, room fresheners, cleansers, paints, solvents and pest control products. Patients with sensitive skin report that scented laundry detergent, fabric softeners and/or dryer sheets can trigger urethral, vulvar or rectal discomfort.  The [place] offers more detailed information on flares and hour by hour rescue plans that can help reduce discomfort.   Eating Plan for Interstitial Cystitis Interstitial cystitis (IC) is a long-term (chronic) condition that causes pain and pressure in the bladder, the lower abdomen, and the pelvic area. Other symptoms of IC  include urinary urgency and frequency. Symptoms tend to come and go. Many people with IC find that certain foods trigger their symptoms. Different foods may be problematic for different people. Some foods are more likely to cause symptoms than others. Learning which foods bother you and which do not can help you come up with an eating plan to manage IC. What are tips for following this plan? You may find it helpful to work with a dietitian. This health care provider can help you develop your eating plan by doing an elimination diet, which involves these steps:  Start with a list of foods that you think trigger your IC symptoms along with the foods that most commonly trigger symptoms for many people with IC.  Eliminate those foods from your diet for about one month, then start reintroducing the foods one at a time to see which ones trigger your symptoms.  Make a  list of the foods that trigger your symptoms. It may take several months to find out which foods bother you. Reading food labels Once you know which foods trigger your IC symptoms, you can avoid them. However, it is also a good idea to read food labels because some foods that trigger your symptoms may be included as ingredients in other foods. These ingredients may include:  Chili peppers.  Tomato products.  Soy.  Worcestershire sauce.  Vinegar.  Alcohol.  Citrus flavors or juices.  Artificial sweeteners.  Monosodium glutamate. Shopping  Shopping can be a challenge if many foods trigger your IC. When you go grocery shopping, bring a list of the foods you can eat.  You can get an app for your phone that lets you know which foods are the safest and which you may want to avoid. You can find the app at the Interstitial Cystitis Network website: www.ic-network.com Meal planning  Plan your meals according to the results of your elimination diet. If you have not done an elimination diet, plan meals according to IC food lists  recommended by your health care provider or dietitian. These lists tell you which foods are least and most likely to cause symptoms.  Avoid certain types of food when you go out to eat, such as pizza and foods typically served at [place], [place], and [place]. These foods often contain ingredients that can aggravate IC. General information Here are some general guidelines for an IC eating plan:  Do not eat large portions.  Drink plenty of fluids with your meals.  Do not eat foods that are high in sugar, salt, or saturated fat.  Choose whole fruits instead of juice.  Eat a colorful variety of vegetables.   What foods should I eat? For people with IC, the best diet is a balanced one that includes things from all the food groups. Even if you have to avoid certain foods, there are still plenty of healthy choices in each group. The following are some foods that are least bothersome and may be safest to eat: Fruits Bananas. Blueberries and blueberry juice. Melons. Pears. Apples. Dates. Prunes. Raisins. Apricots. Vegetables Asparagus. Avocado. Celery. Beets. Bell peppers. Black olives. Broccoli. Brussels sprouts. Cabbage. Carrots. Cauliflower. Cucumber. Eggplant. Green beans. Potatoes. Radishes. Spinach. Squash. Turnips. Zucchini. Mushrooms. Peas. Grains Oats. Rice. Bran. Oatmeal. Whole wheat bread. Meats and other proteins Beef. Fish and other seafood. Eggs. Nuts. Peanut butter. Pork. Poultry. Lamb. Garbanzo beans. Pinto beans. Dairy Whole or low-fat milk. American, mozzarella, mild cheddar, feta, ricotta, and cream cheeses. The items listed above may not be a complete list of foods and beverages you can eat. Contact a dietitian for more information. What foods should I avoid? You should avoid any foods that seem to trigger your symptoms. It is also a good idea to avoid foods that are most likely to cause symptoms in many people with IC. These include the following: Fruits Citrus  fruits, including lemons, limes, oranges, and grapefruit. Cranberries. Strawberries. Pineapple. Kiwi. Vegetables Chili peppers. Onions. Sauerkraut. Tomato and tomato products. Angie Fava. Grains You do not need to avoid any type of grain unless it triggers your symptoms. Meats and other proteins Precooked or cured meats, such as sausages or meat loaves. Soy products. Dairy Chocolate ice cream. Processed cheese. Yogurt. Beverages Alcohol. Chocolate drinks. Coffee. Cranberry juice. Carbonated drinks. Tea (black, green, or herbal). Tomato juice. Sports drinks. The items listed above may not be a complete list of foods and beverages you should avoid. Contact  a dietitian for more information. Summary  Many people with IC find that certain foods trigger their symptoms. Different foods may be problematic for different people. Some foods are more likely to cause symptoms than others.  You may find it helpful to work with a dietitian to do an elimination diet and come up with an eating plan that is right for you.  Plan your meals according to the results of your elimination diet. If you have not done an elimination diet, plan your meals using IC food lists. These lists tell you which foods are least and most likely to cause symptoms.  The best diet for people with IC is a balanced diet that includes foods from all the food groups. Even if you have to avoid certain foods, there are still plenty of healthy choices in each group. This information is not intended to replace advice given to you by your health care provider. Make sure you discuss any questions you have with your health care provider. Document Revised: 05/02/2018 Document Reviewed: 09/13/2017 Elsevier Patient Education  2021 [employer].

## 2020-05-12 ENCOUNTER — Telehealth: Payer: Self-pay

## 2020-05-12 NOTE — Telephone Encounter (Signed)
Called pt informed her of the information below. Pt voiced understanding. 12yr follow up previously scheduled.

## 2020-05-12 NOTE — Telephone Encounter (Signed)
-----   Message from Billey Co, MD sent at 05/12/2020  9:12 AM EDT ----- She has a few small stones on both sides, all are smaller than 5 mm and should be able to pass spontaneously if they moved.  None are blocking at this time.  Would recommend diet strategies as we discussed in clinic.  Recommend continuing yearly follow-up, certainly call us if having problem with stone pain  Nickolas Madrid, MD 05/12/2020

## 2020-05-24 ENCOUNTER — Other Ambulatory Visit (HOSPITAL_BASED_OUTPATIENT_CLINIC_OR_DEPARTMENT_OTHER): Payer: Self-pay

## 2020-05-26 ENCOUNTER — Ambulatory Visit (INDEPENDENT_AMBULATORY_CARE_PROVIDER_SITE_OTHER): Payer: No Typology Code available for payment source | Admitting: Pulmonary Disease

## 2020-05-26 ENCOUNTER — Other Ambulatory Visit: Payer: Self-pay

## 2020-05-26 ENCOUNTER — Encounter: Payer: Self-pay | Admitting: Pulmonary Disease

## 2020-05-26 VITALS — BP 116/64 | HR 80 | Temp 97.3°F | Ht 62.0 in | Wt 149.0 lb

## 2020-05-26 DIAGNOSIS — R0602 Shortness of breath: Secondary | ICD-10-CM

## 2020-05-26 DIAGNOSIS — M3500 Sicca syndrome, unspecified: Secondary | ICD-10-CM

## 2020-05-26 NOTE — Patient Instructions (Addendum)
Your walking test today showed: No lowering of the oxygen levels.  Your oxygen levels remained good throughout the test.  We are going to order breathing tests (pulmonary function test).  We are going to order an echocardiogram to evaluate for potential increased pressure on the artery going from the heart to the lungs (pulmonary hypertension).  Will see him in follow-up in 4 to 6 weeks time call sooner should any new problems arise.

## 2020-05-26 NOTE — Progress Notes (Signed)
Subjective:    Patient ID: Michelle Waller, female    DOB: [birth year], 30 y.o.   MRN: [id number]  Chief Complaint  Patient presents with  . pulmonary consult    Per Leafy Kindle, PA- c/o occ wheezing and sob with exertion x4y.      HPI The patient is a 30 year old remote former smoker with less than 1-pack-year history of smoking who presents for evaluation of shortness of breath of 4 years duration.  She is kindly referred by Dr. Pricilla Holm.  The patient initially felt that her shortness of breath may have been related to anxiety/panic attacks that she is prone to however her symptoms persisted even when she was not anxious.  She was given a trial of albuterol which helped her shortness of breath however did cause issues with tachycardia and so she discontinued use of the medication.  The trial of the medication was somewhere around 20 18-20 19.  She notes that sleep and resting helps her symptoms.  She notes that any physical activity including moderate physical activity will produce the symptoms.  She was diagnosed with Sjogren's syndrome in 2018 and her symptoms at the time were shortness of breath and dizziness.  She does not have joint pain or swelling.  She has had significant gastroesophageal reflux symptoms with chest pain and heartburn and indigestion.  Occasionally feels throat pain due to this.  Was on PPI in the past but not taking now she is only taking famotidine.  She notes that omeprazole caused GI distress.  Currently her dietary habits have been in disarray due to recent move.  She does supplement with vitamins.  She has also been tried on montelukast in the past which she did not feel helped her and only caused issues with nightmares and difficulty sleeping.  He has previously resided in [place].  Does not have military history.  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   Review of Systems A 10 point review of systems was performed and it is as  noted above otherwise negative.  Past Medical History:  Diagnosis Date  . Allergy   . Anxiety   . Asthma    physical induced asthma   . Eating disorder   . Family history of adverse reaction to anesthesia   . Frequent headaches   . Gastritis   . GERD (gastroesophageal reflux disease)   . Hiatal hernia   . Hypothyroid   . IBS (irritable bowel syndrome)   . Kidney stones   . Obsessive compulsive disorder   . Ovarian cyst   . PONV (postoperative nausea and vomiting)   . Pregnancy induced hypertension   . Sjogren's disease ([place])   . Sjogren's syndrome ([place])   . Thyroid disease   . UTI (urinary tract infection)   . Vaginal Pap smear, abnormal   . Vision abnormalities    Past Surgical History:  Procedure Laterality Date  . ADENOIDECTOMY    . CERVIX LESION DESTRUCTION    . COLONOSCOPY    . CYSTOSCOPY    . KIDNEY STONE SURGERY    . KNEE ARTHROSCOPY    . KNEE SURGERY     Family History  Problem Relation Age of Onset  . Thyroid disease Mother   . Hypertension Father   . Diabetes Father   . Thyroid disease Sister   . Cancer Paternal Grandmother        breast  . Diabetes Paternal Grandfather   . Heart disease Paternal Grandfather   .  Depression Mother   . Atrial fibrillation Mother   . Colon polyps Father   . Arthritis Maternal Grandmother   . Heart disease Maternal Grandmother   . Hypertension Paternal Grandmother   . Hypertension Paternal Grandfather   . Diabetes Mellitus II Sister   . Hypothyroidism Sister   . Colon cancer Neg Hx   . Esophageal cancer Neg Hx   . Rectal cancer Neg Hx   . Stomach cancer Neg Hx    Social History   Tobacco Use  . Smoking status: Former Smoker    Packs/day: 0.25    Years: 1.00    Pack years: 0.25    Types: Cigarettes    Quit date: 11/09/2011    Years since quitting: 8.5  . Smokeless tobacco: Never Used  Substance Use Topics  . Alcohol use: Not Currently    Comment: occasionally    Allergies  Allergen Reactions  . Soy  Allergy Anaphylaxis    Large amounts cause anaphylaxis Small amounts cause upset stomach  . Methylprednisolone Hives    Other reaction(s): swelling/hives  . Montelukast   . Montelukast Sodium Other (See Comments)    Bad dreams, nightmares   Current Meds  Medication Sig  . cetirizine (ZYRTEC) 10 MG tablet Take 10 mg by mouth daily.  . cholecalciferol (VITAMIN D3) 25 MCG (1000 UT) tablet Take 1,000 Units by mouth daily.  . citalopram (CELEXA) 10 MG tablet Take 15 mg by mouth daily.  . Cyanocobalamin (B-12) 5000 MCG SUBL Place 1 tablet under the tongue daily as needed.  . Ferrous Sulfate (IRON) 325 (65 Fe) MG TABS Take 1 tablet by mouth daily.  Marland Kitchen labetalol (NORMODYNE) 200 MG tablet TAKE 1 TABLET BY MOUTH 2 TIMES DAILY  . levothyroxine (SYNTHROID) 50 MCG tablet Take 1 tablet (50 mcg total) by mouth daily before breakfast.  . melatonin 5 MG TABS Take 1 tablet by mouth daily.  . Prenatal Vit-Fe Fumarate-FA (PRENATAL VITAMINS PO) Take by mouth.  . Saccharomyces boulardii (PROBIOTIC) 250 MG CAPS Take 1 capsule by mouth daily.   Immunization History  Administered Date(s) Administered  . DTaP 07/12/1989, 09/06/1989, 03/07/1990, 07/09/1992, 06/08/1993  . HPV Quadrivalent 08/26/2007, 03/31/2008, 07/30/2008  . Hepatitis B, ped/adol 03/18/1997, 06/24/1997, 04/15/1998  . HiB (PRP-T) 07/12/1989, 09/06/1989, 03/07/1990  . IPV 07/12/1989, 09/06/1989, 07/09/1992, 06/08/1993  . Influenza, Quadrivalent, Recombinant, Inj, Pf 10/16/2018  . Influenza,inj,Quad PF,6+ Mos 10/04/2015  . Influenza-Unspecified 11/07/2014, 10/06/2016, 10/09/2017, 10/22/2019  . MMR 07/09/1992, 06/08/1993  . Meningococcal Conjugate 06/18/2007  . PFIZER Comirnaty(Gray Top)Covid-19 Tri-Sucrose Vaccine 05/07/2020  . PFIZER(Purple Top)SARS-COV-2 Vaccination 06/13/2019, 07/04/2019  . Td 02/20/2001  . Tdap 01/24/2008, 07/14/2010, 02/04/2020       Objective:   Physical Exam BP 116/64 (BP Location: Left Arm, Cuff Size: Normal)    Pulse 80   Temp (!) 97.3 F (36.3 C) (Temporal)   Ht _0  (1.575 m)   Wt 149 lb (67.6 kg)   SpO2 100%   BMI 27.25 kg/m  GENERAL: Well-developed well-nourished woman, no acute distress.  Conversational dyspnea. HEAD: Normocephalic, atraumatic.  EYES: Pupils equal, round, reactive to light.  No scleral icterus.  MOUTH: Nose/mouth/throat not examined due to masking requirements for COVID 19. NECK: Supple. No thyromegaly. Trachea midline. No JVD.  No adenopathy. PULMONARY: Good air entry bilaterally.  Coarse, otherwise no adventitious sounds. CARDIOVASCULAR: S1 and S2. Regular rate and rhythm.  No rubs, murmurs or gallops heard. ABDOMEN: Benign. MUSCULOSKELETAL: No joint deformity, no clubbing, no edema.  NEUROLOGIC: No focal deficit,  no gait disturbance, speech is fluent. SKIN: Intact,warm,dry.  On limited exam no rashes PSYCH: Mood and behavior normal   Ambulatory symmetry performed today: Patient ambulated 750 feet without any difficulty, durations maintained between 98 to 100%.  Chest x-ray performed 08/08/2019, no acute disease, on my review appears to have mild hyperinflation:     Assessment & Plan:     ICD-10-CM   1. Shortness of breath  R06.02 ECHOCARDIOGRAM COMPLETE    Pulmonary Function Test [facility] Only   Ongoing for 4 years Prior improvement with bronchodilators Hyperinflation noted on chest x-ray Query asthma PFTs/2D echo  2. Sjogren's syndrome, with unspecified organ involvement ([place])  M35.00    This issue adds complexity to her management Makes her high risk for ILD and PAH PFTs, 2D echo   Orders Placed This Encounter  Procedures  . Pulmonary Function Test [facility] Only    3 weeks    Standing Status:   Future    Standing Expiration Date:   05/26/2021    Order Specific Question:   Full PFT: includes the following: basic spirometry, spirometry pre & post bronchodilator, diffusion capacity (DLCO), lung volumes    Answer:   Full PFT  . ECHOCARDIOGRAM COMPLETE     Standing Status:   Future    Standing Expiration Date:   11/26/2020    Order Specific Question:   Where should this test be performed    Answer:   [facility]    Order Specific Question:   Perflutren DEFINITY (image enhancing agent) should be administered unless hypersensitivity or allergy exist    Answer:   Administer Perflutren    Order Specific Question:   Reason for exam-Echo    Answer:   Dyspnea  R06.00   Patient has had dyspnea on exertion for approximately 4 years.  Did note some improvement with bronchodilator however had untoward side effect of tachycardia.  She has not been challenged with bronchodilators again.  Chest x-ray in July appears somewhat hyperinflated.  No evidence of ILD.  Will obtain pulmonary function testing, this will help in diagnosis.  Pending this she may need high-resolution CT chest but will await PFT results particularly with regards to DLCO.  High risk for ILD due to Sjogren's syndrome.  Patient is also high risk for PAH due to Sjogren's and therefore will repeat 2D echo to reevaluate.  She should probably have yearly 2D echo to exclude this.  We will see her in follow-up in 4 to 6 weeks time she is to contact us prior to that time should any new difficulties arise.  Renold Don, MD [facility]   *This note was dictated using voice recognition software/Dragon.  Despite best efforts to proofread, errors can occur which can change the meaning.  Any change was purely unintentional.

## 2020-06-02 ENCOUNTER — Encounter: Payer: Self-pay | Admitting: Emergency Medicine

## 2020-06-02 ENCOUNTER — Emergency Department
Admission: EM | Admit: 2020-06-02 | Discharge: 2020-06-02 | Disposition: A | Discharge status: Home / Self Care | Payer: PRIVATE HEALTH INSURANCE | Attending: Emergency Medicine | Admitting: Emergency Medicine

## 2020-06-02 ENCOUNTER — Emergency Department: Payer: PRIVATE HEALTH INSURANCE

## 2020-06-02 ENCOUNTER — Other Ambulatory Visit: Payer: Self-pay

## 2020-06-02 DIAGNOSIS — R7989 Other specified abnormal findings of blood chemistry: Secondary | ICD-10-CM

## 2020-06-02 DIAGNOSIS — N200 Calculus of kidney: Secondary | ICD-10-CM | POA: Diagnosis not present

## 2020-06-02 DIAGNOSIS — Z79899 Other long term (current) drug therapy: Secondary | ICD-10-CM | POA: Diagnosis not present

## 2020-06-02 DIAGNOSIS — R072 Precordial pain: Secondary | ICD-10-CM | POA: Diagnosis not present

## 2020-06-02 DIAGNOSIS — J45909 Unspecified asthma, uncomplicated: Secondary | ICD-10-CM | POA: Diagnosis not present

## 2020-06-02 DIAGNOSIS — E039 Hypothyroidism, unspecified: Secondary | ICD-10-CM | POA: Diagnosis not present

## 2020-06-02 DIAGNOSIS — R079 Chest pain, unspecified: Secondary | ICD-10-CM

## 2020-06-02 DIAGNOSIS — R791 Abnormal coagulation profile: Secondary | ICD-10-CM | POA: Diagnosis not present

## 2020-06-02 DIAGNOSIS — Z87891 Personal history of nicotine dependence: Secondary | ICD-10-CM | POA: Diagnosis not present

## 2020-06-02 DIAGNOSIS — R1010 Upper abdominal pain, unspecified: Secondary | ICD-10-CM | POA: Diagnosis present

## 2020-06-02 DIAGNOSIS — K219 Gastro-esophageal reflux disease without esophagitis: Secondary | ICD-10-CM | POA: Insufficient documentation

## 2020-06-02 LAB — BASIC METABOLIC PANEL
Anion gap: 9 (ref 5–15)
BUN: 14 mg/dL (ref 6–20)
CO2: 26 mmol/L (ref 22–32)
Calcium: 9.3 mg/dL (ref 8.9–10.3)
Chloride: 106 mmol/L (ref 98–111)
Creatinine, Ser: 0.63 mg/dL (ref 0.44–1.00)
GFR, Estimated: >60 mL/min (ref >60)
Glucose, Bld: 93 mg/dL (ref 70–99)
Potassium: 4.1 mmol/L (ref 3.5–5.1)
Sodium: 141 mmol/L (ref 135–145)

## 2020-06-02 LAB — CBC
HCT: 35.6 % — ABNORMAL LOW (ref 36.0–46.0)
Hemoglobin: 12.1 g/dL (ref 12.0–15.0)
MCH: 31.1 pg (ref 26.0–34.0)
MCHC: 34 g/dL (ref 30.0–36.0)
MCV: 91.5 fL (ref 80.0–100.0)
Platelets: 186 10*3/uL (ref 150–400)
RBC: 3.89 MIL/uL (ref 3.87–5.11)
RDW: 11.6 % (ref 11.5–15.5)
WBC: 3.5 10*3/uL — ABNORMAL LOW (ref 4.0–10.5)
nRBC: 0 % (ref 0.0–0.2)

## 2020-06-02 LAB — POC URINE PREG, ED: Preg Test, Ur: NEGATIVE

## 2020-06-02 LAB — TROPONIN I (HIGH SENSITIVITY)
Troponin I (High Sensitivity): <2 ng/L (ref <18)
Troponin I (High Sensitivity): <2 ng/L (ref <18)

## 2020-06-02 LAB — D-DIMER, QUANTITATIVE: D-Dimer, Quant: 1.54 ug/mL-FEU — ABNORMAL HIGH (ref 0.00–0.50)

## 2020-06-02 MED ORDER — IOHEXOL 350 MG/ML SOLN
75.0000 mL | Freq: Once | INTRAVENOUS | Status: AC | PRN
  Administered 2020-06-02: 75 mL via INTRAVENOUS
  Filled 2020-06-02: qty 75

## 2020-06-02 MED ORDER — KETOROLAC TROMETHAMINE 30 MG/ML IJ SOLN
30.0000 mg | Freq: Once | INTRAMUSCULAR | Status: DC
  Filled 2020-06-02: qty 1

## 2020-06-02 MED ORDER — ACETAMINOPHEN 500 MG PO TABS
1000.0000 mg | ORAL_TABLET | Freq: Once | ORAL | Status: AC
  Administered 2020-06-02: 1000 mg via ORAL
  Filled 2020-06-02: qty 2

## 2020-06-02 NOTE — ED Triage Notes (Signed)
Pt comes into the ED via POV c/o generalized chest pain that started about an hour ago.  Pt denies any radiating pain to the neck, back jaw, or arm.  Pt denies any SHOB, vomiting or dizziness, but states she has some nausea.  Pt denies any cardiac history.  Pt ambulatory to exam room with even and unlabored respirations.

## 2020-06-02 NOTE — ED Triage Notes (Signed)
First Nurse Note:  C/O right sided CP x 1 hour.  AAOx3.  Skin warm and dry. NAD  AAOx3.  Skin warm and dry. NAD

## 2020-06-02 NOTE — ED Provider Notes (Signed)
[facility] Emergency Department Provider Note  ____________________________________________   Event Date/Time   First MD Initiated Contact with Patient 06/02/20 1328     (approximate)  I have reviewed the triage vital signs and the nursing notes.   HISTORY  Chief Complaint Chest Pain   HPI Michelle Waller is a 30 y.o. female with a past medical history of asthma, GERD, IBS, hypothyroidism, Sjogren's disease and postpartum depression currently being treated with Celexa with recent addition of Abilify to 3 days ago who presents for assessment of some substernal right-sided chest pain.  He states that started about an hour prior to arrival.  He states he does not feel like any chest pain she has had in the past ventricular, anxious and felt more like a tightness or soreness.  It is worse whenever she is pressing on her chest.  It is now better than when it started.  States she felt little bit of pain in her upper abdomen and started but no other clear acute associated symptoms.  She has not had any headache, earache, sore throat, cough, vomiting, diarrhea, dysuria, rash, other abdominal back pain or any other acute complaints.  No prior similar episodes.  No history of recent surgeries, hemoptysis, DVT/PE, tobacco abuse, EtOH use or illicit drug use.  No other acute concerns at this time.         Past Medical History:  Diagnosis Date  . Allergy   . Anxiety   . Asthma    physical induced asthma   . Eating disorder   . Family history of adverse reaction to anesthesia   . Frequent headaches   . Gastritis   . GERD (gastroesophageal reflux disease)   . Hiatal hernia   . Hypothyroid   . IBS (irritable bowel syndrome)   . Kidney stones   . Obsessive compulsive disorder   . Ovarian cyst   . PONV (postoperative nausea and vomiting)   . Pregnancy induced hypertension   . Sjogren's disease ([place])   . Sjogren's syndrome ([place])   . Thyroid disease   . UTI  (urinary tract infection)   . Vaginal Pap smear, abnormal   . Vision abnormalities     Patient Active Problem List   Diagnosis Date Noted  . Blood pressure alteration 03/05/2020  . Seasonal and perennial allergic rhinitis 04/16/2019  . Palpitations 01/15/2019  . Nonallopathic lesion of lumbosacral region 10/02/2017  . Nonallopathic lesion of sacral region 10/02/2017  . Irritable bowel syndrome 06/21/2017  . Routine general medical examination at a health care facility 06/14/2017  . 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 Nonallopathic lesion of thoracic region 04/30/2017  . Nonallopathic lesion of rib cage 04/30/2017  . Nonallopathic lesion of cervical region 04/30/2017  . ANA positive 10/31/2016  . Anxiety 08/15/2016  . Hypothyroidism 02/03/2016    Past Surgical History:  Procedure Laterality Date  . ADENOIDECTOMY    . CERVIX LESION DESTRUCTION    . COLONOSCOPY    . CYSTOSCOPY    . KIDNEY STONE SURGERY    . KNEE ARTHROSCOPY    . KNEE SURGERY      Prior to Admission medications   Medication Sig Start Date End Date Taking? Authorizing Provider  cetirizine (ZYRTEC) 10 MG tablet Take 10 mg by mouth daily.    [provider]  cholecalciferol (VITAMIN D3) 25 MCG (1000 UT) tablet Take 1,000 Units by mouth daily.    [provider]  citalopram (CELEXA) 10 MG tablet Take 15  mg by mouth daily. 03/27/20   [provider]  Cyanocobalamin (B-12) 5000 MCG SUBL Place 1 tablet under the tongue daily as needed.    [provider]  Ferrous Sulfate (IRON) 325 (65 Fe) MG TABS Take 1 tablet by mouth daily.    [provider]  labetalol (NORMODYNE) 200 MG tablet TAKE 1 TABLET BY MOUTH 2 TIMES DAILY 04/01/20 04/01/21  Hoyt Koch, MD  levothyroxine (SYNTHROID) 50 MCG tablet Take 1 tablet (50 mcg total) by mouth daily before breakfast. 03/30/20   Hoyt Koch, MD  melatonin 5 MG TABS Take 1 tablet by mouth daily.    [provider]   Prenatal Vit-Fe Fumarate-FA (PRENATAL VITAMINS PO) Take by mouth.    [provider]  Saccharomyces boulardii (PROBIOTIC) 250 MG CAPS Take 1 capsule by mouth daily.    [provider]  albuterol (PROVENTIL HFA;VENTOLIN HFA) 108 (90 Base) MCG/ACT inhaler Inhale 2 puffs into the lungs every 4 (four) hours as needed for wheezing or shortness of breath. 11/19/17 07/05/19  Lyndal Pulley, DO  Fluticasone Propionate Truett Perna) 93 MCG/ACT EXHU Place 32 sprays into the nose in the morning and at bedtime. 05/27/19 07/05/19  Valentina Shaggy, MD  omeprazole (PRILOSEC) 40 MG capsule Take 1 capsule (40 mg total) by mouth daily. 06/20/19 09/04/19  Mauri Pole, MD  sucralfate (CARAFATE) 1 g tablet Take 1 tablet (1 g total) by mouth 4 (four) times daily -  with meals and at bedtime. 06/20/19 07/05/19  Mauri Pole, MD    Allergies Soy allergy, Methylprednisolone, Montelukast, and Montelukast sodium  Family History  Problem Relation Age of Onset  . Thyroid disease Mother   . Hypertension Father   . Diabetes Father   . Thyroid disease Sister   . Cancer Paternal Grandmother        breast  . Diabetes Paternal Grandfather   . Heart disease Paternal Grandfather   . Depression Mother   . Atrial fibrillation Mother   . Colon polyps Father   . Arthritis Maternal Grandmother   . Heart disease Maternal Grandmother   . Hypertension Paternal Grandmother   . Hypertension Paternal Grandfather   . Diabetes Mellitus II Sister   . Hypothyroidism Sister   . Colon cancer Neg Hx   . Esophageal cancer Neg Hx   . Rectal cancer Neg Hx   . Stomach cancer Neg Hx     Social History Social History   Tobacco Use  . Smoking status: Former Smoker    Packs/day: 0.25    Years: 1.00    Pack years: 0.25    Types: Cigarettes    Quit date: 11/09/2011    Years since quitting: 8.5  . Smokeless tobacco: Never Used  Vaping Use  . Vaping Use: Never used  Substance Use Topics  . Alcohol use:  Not Currently    Comment: occasionally   . Drug use: Never    Review of Systems  Review of Systems  Constitutional: Negative for chills and fever.  HENT: Negative for sore throat.   Eyes: Negative for pain.  Respiratory: Negative for cough and stridor.   Cardiovascular: Positive for chest pain.  Gastrointestinal: Negative for vomiting.  Skin: Negative for rash.  Neurological: Negative for seizures, loss of consciousness and headaches.  Psychiatric/Behavioral: Negative for suicidal ideas.  All other systems reviewed and are negative.     ____________________________________________   PHYSICAL EXAM:  VITAL SIGNS: ED Triage Vitals [06/02/20 1323]  Enc Vitals  Group     BP 133/68     Pulse Rate 82     Resp 17     Temp 98.9 F (37.2 C)     Temp Source Oral     SpO2 100 %     Weight 149 lb (67.6 kg)     Height 5\' 2"  (1.575 m)     Head Circumference      Peak Flow      Pain Score 6     Pain Loc      Pain Edu?      Excl. in [place]?    Vitals:   06/02/20 1430 06/02/20 1500  BP: 114/73 120/76  Pulse: 67 91  Resp: 13 (!) 21  Temp:    SpO2: 100% 100%   Physical Exam Vitals and nursing note reviewed.  Constitutional:      General: She is not in acute distress.    Appearance: She is well-developed.  HENT:     Head: Normocephalic and atraumatic.     Right Ear: External ear normal.     Left Ear: External ear normal.     Nose: Nose normal.  Eyes:     Conjunctiva/sclera: Conjunctivae normal.  Cardiovascular:     Rate and Rhythm: Normal rate and regular rhythm.     Heart sounds: No murmur heard.   Pulmonary:     Effort: Pulmonary effort is normal. No respiratory distress.     Breath sounds: Normal breath sounds.  Abdominal:     Palpations: Abdomen is soft.     Tenderness: There is no abdominal tenderness.  Musculoskeletal:     Cervical back: Neck supple.  Skin:    General: Skin is warm and dry.     Capillary Refill: Capillary refill takes less than 2 seconds.   Neurological:     Mental Status: She is alert and oriented to person, place, and time.  Psychiatric:        Mood and Affect: Mood normal.     Patient is tender over her right upper chest there are no overlying skin changes. ____________________________________________   LABS (all labs ordered are listed, but only abnormal results are displayed)  Labs Reviewed  CBC - Abnormal; Notable for the following components:      Result Value   WBC 3.5 (*)    HCT 35.6 (*)    All other components within normal limits  D-DIMER, QUANTITATIVE - Abnormal; Notable for the following components:   D-Dimer, Quant 1.54 (*)    All other components within normal limits  BASIC METABOLIC PANEL  POC URINE PREG, ED  TROPONIN I (HIGH SENSITIVITY)  TROPONIN I (HIGH SENSITIVITY)   ____________________________________________  EKG  Sinus rhythm with ventricular to 76, normal axis, unremarkable intervals without clear evidence of acute ischemia or significant underlying arrhythmia.  ____________________________________________  RADIOLOGY  ED MD interpretation: Chest x-ray has no focal consolidation, effusion, syncope edema, pneumothorax or any other clear acute intrathoracic process.  CTA chest has no evidence of PE other clear acute intrathoracic process.  Bilateral nonobstructing kidney stone seen.  Official radiology report(s): DG Chest 2 View  Result Date: 06/02/2020 CLINICAL DATA:  Chest pain EXAM: CHEST - 2 VIEW COMPARISON:  August 08, 2019 FINDINGS: Lungs are clear. Heart size and pulmonary vascularity are normal. No adenopathy. No pneumothorax. No bone lesions. IMPRESSION: Lungs clear.  Cardiac silhouette within normal limits. Electronically Signed   By: Lowella Grip III M.D.   On: 06/02/2020 14:18   CT  Angio Chest PE W and/or Wo Contrast  Result Date: 06/02/2020 CLINICAL DATA:  Hyperkalemia pulmonary embolism suspected Right neck pain since noon today EXAM: CT ANGIOGRAPHY CHEST WITH CONTRAST  TECHNIQUE: Multidetector CT imaging of the chest was performed using the standard protocol during bolus administration of intravenous contrast. Multiplanar CT image reconstructions and MIPs were obtained to evaluate the vascular anatomy. CONTRAST:  76mL OMNIPAQUE IOHEXOL 350 MG/ML SOLN COMPARISON:  Chest radiograph 08/08/2019 FINDINGS: Cardiovascular: Satisfactory opacification of the pulmonary arteries to the segmental level. No evidence of pulmonary embolism. Normal heart size. No pericardial effusion. Mediastinum/Nodes: Mildly prominent lymph nodes in seen in the axilla bilaterally are not enlarged by CT criteria. No enlarged supraclavicular, mediastinal, hilar lymph nodes. Lungs/Pleura: Regions of nodular thickening along the right major fissure likely due to intrapulmonary lymph nodes. Upper Abdomen: Nonobstructing bilateral renal calculi partially visualized the largest measuring 3 mm on the left and 4 mm on the right. Musculoskeletal: No chest wall abnormality. No acute or significant osseous findings. Review of the MIP images confirms the above findings. IMPRESSION: 1. No acute abnormality of the chest. 2. Bilateral nonobstructing calculi seen within the visualized portions of the kidneys. Electronically Signed   By: Miachel Roux M.D.   On: 06/02/2020 16:06    ____________________________________________   PROCEDURES  Procedure(s) performed (including Critical Care):  Procedures   ____________________________________________   INITIAL IMPRESSION / ASSESSMENT AND PLAN / ED COURSE      Patient presents with above to history exam for assessment of some right-sided and substernal chest pain rating to her upper abdomen that started earlier today when she was driving.  On arrival she is afebrile and hemodynamically stable.  Her lungs are clear bilaterally and she is some tenderness over the right chest without any Skin changes to suggest cellulitis.  No tenderness in the epigastrium or right  upper quadrant to suggest cholecystitis.  Chest x-ray shows no full consolidation, pneumothorax, edema or other clear acute intrathoracic process.  ECG is not consistent with arrhythmia or ischemia and given nonelevated troponin x2 I have a low suspicion for ACS or myocarditis.  BMP and CBC are unremarkable.  D-dimer is elevated at 1.54.  CTA chest obtained to rule out PE. Given stable vitals with eyes reassuring exam work-up.  Improved from 1 began I think she is safe for discharge for continued outpatient evaluation.  CTA chest has no evidence of PE other clear acute intrathoracic process.  Bilateral nonobstructing kidney stone seen.  Advised her based on findings of bilateral kidney stones the patient states she is already aware of this.  On reassessment patient states he felt much better.  Given stable vitals with eyes reassuring exam work-up.  Improved from 1 began I think she is safe for discharge for continued outpatient evaluation.     ____________________________________________   FINAL CLINICAL IMPRESSION(S) / ED DIAGNOSES  Final diagnoses:  Chest pain, unspecified type  Positive D dimer  Kidney stones    Medications  ketorolac (TORADOL) 30 MG/ML injection 30 mg (30 mg Intravenous Not Given 06/02/20 1654)  iohexol (OMNIPAQUE) 350 MG/ML injection 75 mL (75 mLs Intravenous Contrast Given 06/02/20 1526)  acetaminophen (TYLENOL) tablet 1,000 mg (1,000 mg Oral Given 06/02/20 1654)     ED Discharge Orders    None       Note:  This document was prepared using Dragon voice recognition software and may include unintentional dictation errors.   Lucrezia Starch, MD 06/02/20 [phone]

## 2020-06-03 ENCOUNTER — Other Ambulatory Visit: Payer: Self-pay

## 2020-06-03 ENCOUNTER — Encounter: Payer: Self-pay | Admitting: Internal Medicine

## 2020-06-04 ENCOUNTER — Ambulatory Visit (INDEPENDENT_AMBULATORY_CARE_PROVIDER_SITE_OTHER): Payer: PRIVATE HEALTH INSURANCE | Admitting: Gastroenterology

## 2020-06-04 ENCOUNTER — Other Ambulatory Visit: Payer: Self-pay

## 2020-06-04 ENCOUNTER — Encounter: Payer: Self-pay | Admitting: Gastroenterology

## 2020-06-04 VITALS — BP 122/82 | HR 91 | Temp 98.0°F | Ht 62.0 in | Wt 149.2 lb

## 2020-06-04 DIAGNOSIS — D509 Iron deficiency anemia, unspecified: Secondary | ICD-10-CM | POA: Diagnosis not present

## 2020-06-04 DIAGNOSIS — K5904 Chronic idiopathic constipation: Secondary | ICD-10-CM | POA: Diagnosis not present

## 2020-06-04 DIAGNOSIS — K602 Anal fissure, unspecified: Secondary | ICD-10-CM | POA: Diagnosis not present

## 2020-06-04 DIAGNOSIS — K219 Gastro-esophageal reflux disease without esophagitis: Secondary | ICD-10-CM | POA: Diagnosis not present

## 2020-06-04 MED ORDER — OMEPRAZOLE 40 MG PO CPDR: 40.0000 mg | DELAYED_RELEASE_CAPSULE | Freq: Two times a day (BID) | ORAL | 2 refills | Status: DC

## 2020-06-04 NOTE — Patient Instructions (Addendum)
[facility] [address] [place] [address]. Phone number [phone]. Please allow at least 24 hours before picking up the compounded cream because the pharmacy has to make the medication.      High-Fiber Eating Plan Fiber, also called dietary fiber, is a type of carbohydrate. It is found foods such as fruits, vegetables, whole grains, and beans. A high-fiber diet can have many health benefits. Your health care provider may recommend a high-fiber diet to help:  Prevent constipation. Fiber can make your bowel movements more regular.  Lower your cholesterol.  Relieve the following conditions: ? Inflammation of veins in the anus (hemorrhoids). ? Inflammation of specific areas of the digestive tract (uncomplicated diverticulosis). ? A problem of the large intestine, also called the colon, that sometimes causes pain and diarrhea (irritable bowel syndrome, or IBS).  Prevent overeating as part of a weight-loss plan.  Prevent heart disease, type 2 diabetes, and certain cancers. What are tips for following this plan? Reading food labels  Check the nutrition facts label on food products for the amount of dietary fiber. Choose foods that have 5 grams of fiber or more per serving.  The goals for recommended daily fiber intake include: ? Men (age 60 or younger): 34-38 g. ? Men (over age 23): 28-34 g. ? Women (age 45 or younger): 25-28 g. ? Women (over age 37): 22-25 g. Your daily fiber goal is _____________ g.   Shopping  Choose whole fruits and vegetables instead of processed forms, such as apple juice or applesauce.  Choose a wide variety of high-fiber foods such as avocados, lentils, oats, and kidney beans.  Read the nutrition facts label of the foods you choose. Be aware of foods with added fiber. These foods often have high sugar and sodium amounts per serving. Cooking  Use whole-grain flour for baking and cooking.  Cook with brown rice instead of white rice. Meal  planning  Start the day with a breakfast that is high in fiber, such as a cereal that contains 5 g of fiber or more per serving.  Eat breads and cereals that are made with whole-grain flour instead of refined flour or white flour.  Eat brown rice, bulgur wheat, or millet instead of white rice.  Use beans in place of meat in soups, salads, and pasta dishes.  Be sure that half of the grains you eat each day are whole grains. General information  You can get the recommended daily intake of dietary fiber by: ? Eating a variety of fruits, vegetables, grains, nuts, and beans. ? Taking a fiber supplement if you are not able to take in enough fiber in your diet. It is better to get fiber through food than from a supplement.  Gradually increase how much fiber you consume. If you increase your intake of dietary fiber too quickly, you may have bloating, cramping, or gas.  Drink plenty of water to help you digest fiber.  Choose high-fiber snacks, such as berries, raw vegetables, nuts, and popcorn. What foods should I eat? Fruits Berries. Pears. Apples. Oranges. Avocado. Prunes and raisins. Dried figs. Vegetables Sweet potatoes. Spinach. Kale. Artichokes. Cabbage. Broccoli. Cauliflower. Green peas. Carrots. Squash. Grains Whole-grain breads. Multigrain cereal. Oats and oatmeal. Brown rice. Barley. Bulgur wheat. [place]. Quinoa. Bran muffins. Popcorn. Rye wafer crackers. Meats and other proteins Navy beans, kidney beans, and pinto beans. Soybeans. Split peas. Lentils. Nuts and seeds. Dairy Fiber-fortified yogurt. Beverages Fiber-fortified soy milk. Fiber-fortified orange juice. Other foods Fiber bars. The items  listed above may not be a complete list of recommended foods and beverages. Contact a dietitian for more information. What foods should I avoid? Fruits Fruit juice. Cooked, strained fruit. Vegetables Fried potatoes. Canned vegetables. Well-cooked vegetables. Grains White bread.  Pasta made with refined flour. White rice. Meats and other proteins Fatty cuts of meat. Fried chicken or fried fish. Dairy Milk. Yogurt. Cream cheese. Sour cream. Fats and oils Butters. Beverages Soft drinks. Other foods Cakes and pastries. The items listed above may not be a complete list of foods and beverages to avoid. Talk with your dietitian about what choices are best for you. Summary  Fiber is a type of carbohydrate. It is found in foods such as fruits, vegetables, whole grains, and beans.  A high-fiber diet has many benefits. It can help to prevent constipation, lower blood cholesterol, aid weight loss, and reduce your risk of heart disease, diabetes, and certain cancers.  Increase your intake of fiber gradually. Increasing fiber too quickly may cause cramping, bloating, and gas. Drink plenty of water while you increase the amount of fiber you consume.  The best sources of fiber include whole fruits and vegetables, whole grains, nuts, seeds, and beans. This information is not intended to replace advice given to you by your health care provider. Make sure you discuss any questions you have with your health care provider. Document Revised: 05/15/2019 Document Reviewed: 05/15/2019 Elsevier Patient Education  2021 [place] for Gastroesophageal Reflux Disease, Adult When you have gastroesophageal reflux disease (GERD), the foods you eat and your eating habits are very important. Choosing the right foods can help ease your discomfort. Think about working with a food expert (dietitian) to help you make good choices. What are tips for following this plan? Reading food labels  Look for foods that are low in saturated fat. Foods that may help with your symptoms include: ? Foods that have less than 5% of daily value (DV) of fat. ? Foods that have 0 grams of trans fat. Cooking  Do not fry your food.  Cook your food by baking, steaming, grilling, or broiling. These  are all methods that do not need a lot of fat for cooking.  To add flavor, try to use herbs that are low in spice and acidity. Meal planning  Choose healthy foods that are low in fat, such as: ? Fruits and vegetables. ? Whole grains. ? Low-fat dairy products. ? Lean meats, fish, and poultry.  Eat small meals often instead of eating 3 large meals each day. Eat your meals slowly in a place where you are relaxed. Avoid bending over or lying down until 2-3 hours after eating.  Limit high-fat foods such as fatty meats or fried foods.  Limit your intake of fatty foods, such as oils, butter, and shortening.  Avoid the following as told by your doctor: ? Foods that cause symptoms. These may be different for different people. Keep a food diary to keep track of foods that cause symptoms. ? Alcohol. ? Drinking a lot of liquid with meals. ? Eating meals during the 2-3 hours before bed.   Lifestyle  Stay at a healthy weight. Ask your doctor what weight is healthy for you. If you need to lose weight, work with your doctor to do so safely.  Exercise for at least 30 minutes on 5 or more days each week, or as told by your doctor.  Wear loose-fitting clothes.  Do not smoke or use any products that contain  nicotine or tobacco. If you need help quitting, ask your doctor.  Sleep with the head of your bed higher than your feet. Use a wedge under the mattress or blocks under the bed frame to raise the head of the bed.  Chew sugar-free gum after meals. What foods should eat? Eat a healthy, well-balanced diet of fruits, vegetables, whole grains, low-fat dairy products, lean meats, fish, and poultry. Each person is different. Foods that may cause symptoms in one person may not cause any symptoms in another person. Work with your doctor to find foods that are safe for you. The items listed above may not be a complete list of what you can eat and drink. Contact a food expert for more options.   What foods  should I avoid? Limiting some of these foods may help in managing the symptoms of GERD. Everyone is different. Talk with a food expert or your doctor to help you find the exact foods to avoid, if any. Fruits Any fruits prepared with added fat. Any fruits that cause symptoms. For some people, this may include citrus fruits, such as oranges, grapefruit, pineapple, and lemons. Vegetables Deep-fried vegetables. [place] fries. Any vegetables prepared with added fat. Any vegetables that cause symptoms. For some people, this may include tomatoes and tomato products, chili peppers, onions and garlic, and horseradish. Grains Pastries or quick breads with added fat. Meats and other proteins High-fat meats, such as fatty beef or pork, hot dogs, ribs, ham, sausage, salami, and bacon. Fried meat or protein, including fried fish and fried chicken. Nuts and nut butters, in large amounts. Dairy Whole milk and chocolate milk. Sour cream. Cream. Ice cream. Cream cheese. Milkshakes. Fats and oils Butter. Margarine. Shortening. Ghee. Beverages Coffee and tea, with or without caffeine. Carbonated beverages. Sodas. Energy drinks. Fruit juice made with acidic fruits, such as orange or grapefruit. Tomato juice. Alcoholic drinks. Sweets and desserts Chocolate and cocoa. Donuts. Seasonings and condiments Pepper. Peppermint and spearmint. Added salt. Any condiments, herbs, or seasonings that cause symptoms. For some people, this may include curry, hot sauce, or vinegar-based salad dressings. The items listed above may not be a complete list of what you should not eat and drink. Contact a food expert for more options. Questions to ask your doctor Diet and lifestyle changes are often the first steps that are taken to manage symptoms of GERD. If diet and lifestyle changes do not help, talk with your doctor about taking medicines. Where to find more information  International Foundation for Gastrointestinal Disorders:  aboutgerd.org Summary  When you have GERD, food and lifestyle choices are very important in easing your symptoms.  Eat small meals often instead of 3 large meals a day. Eat your meals slowly and in a place where you are relaxed.  Avoid bending over or lying down until 2-3 hours after eating.  Limit high-fat foods such as fatty meats or fried foods. This information is not intended to replace advice given to you by your health care provider. Make sure you discuss any questions you have with your health care provider. Document Revised: 07/21/2019 Document Reviewed: 07/21/2019 Elsevier Patient Education  [place].

## 2020-06-04 NOTE — Progress Notes (Signed)
Cephas Darby, MD [address]  [place]  [place], [place] [address]  Main: [phone]  Fax: [phone]    Gastroenterology Consultation  Referring Provider:     Hoyt Koch, * Primary Care Physician:  Hoyt Koch, MD Primary Gastroenterologist:  Dr. Cephas Darby Reason for Consultation:     Rectal pain, chronic GERD, chronic constipation        HPI:   Michelle Waller is a 30 y.o. female referred by Dr. Sharlet Salina, Real Cons, MD  for consultation & management of rectal pain started about 3 to 4 months ago.  Patient delivered in January 2022.  Since then, she has been experiencing intense sharp pain in her rectum with each bowel movement.  She does have history of chronic constipation, has worsened since her delivery.  Her stools are hard and lumpy.  She used to take Colace.  She does have a history of chronic heartburn, for which she is currently taking Pepcid.  Patient was taking Nexium during her pregnancy as it was worse.  Patient has been previously seen by Dr. Hilarie Fredrickson in [place].  She underwent upper endoscopy in May 2021, esophageal biopsies revealed reflux esophagitis, empirically dilated to 17 mm with savory.  She underwent colonoscopy in 2020 which was unremarkable.  She underwent upper endoscopy in 2019, gastric biopsies were normal. Patient's diet is devoid of fiber She does not smoke or drink alcohol She works as a Facilities manager She does have history of anxiety  NSAIDs: None  Antiplts/Anticoagulants/Anti thrombotics: None  GI Procedures:  EGD 06/19/2019 Surgical [P], distal esophagus and proximal esophagus - ESOPHAGEAL SQUAMOUS MUCOSA WITH MILD VASCULAR CONGESTION, AND FOCAL SQUAMOUS BALLOONING, SUGGESTIVE OF REFLUX ESOPHAGITIS - NEGATIVE FOR INCREASED INTRAEPITHELIAL EOSINOPHILS - White specked mucosa in the esophagus. Biopsied. - 2 cm hiatal hernia. - No endoscopic esophageal abnormality such as stricture or ring to explain  patient's dysphagia. Esophagus dilated with 17 mm Savary over a guidewire. - Normal stomach. - Normal examined duodenum.  Colonoscopy 03/19/2018 - The digital rectal exam was normal. - The terminal ileum appeared normal. - The entire examined colon appeared normal on direct and retroflexion views. - Biopsies for histology were taken with a cold forceps from the right colon, left colon and rectum for evaluation of microscopic colitis. Diagnosis 1. Surgical [P], right colon - COLONIC MUCOSA WITH NO SPECIFIC HISTOPATHOLOGIC CHANGES - NEGATIVE FOR ACUTE INFLAMMATION, FEATURES OF CHRONICITY, INCREASED INTRAEPITHELIAL LYMPHOCYTES OR THICKENED SUBEPITHELIAL COLLAGEN TABLE 2. Surgical [P], left colon - COLONIC MUCOSA WITH NO SPECIFIC HISTOPATHOLOGIC CHANGES - NEGATIVE FOR ACUTE INFLAMMATION, FEATURES OF CHRONICITY, INCREASED INTRAEPITHELIAL LYMPHOCYTES OR THICKENED SUBEPITHELIAL COLLAGEN TABLE  Upper endoscopy 02/01/2017 - Normal mucosa was found in the entire esophagus. - A 2 cm hiatal hernia was present. The GE junction is patulous. - Segmental mild inflammation characterized by erosions and erythema was found in the gastric body. Biopsies were taken with a cold forceps for histology. - The examined duodenum was normal. Surgical [P], gastric antrum and gastric body - GASTRIC ANTRAL AND OXYNTIC MUCOSA WITH NO SPECIFIC HISTOPATHOLOGIC CHANGES. - WARTHIN-STARRY STAIN IS NEGATIVE FOR HELICOBACTER PYLORI.  Past Medical History:  Diagnosis Date  . Allergy   . Anxiety   . Asthma    physical induced asthma   . Eating disorder   . Family history of adverse reaction to anesthesia   . Frequent headaches   . Gastritis   . GERD (gastroesophageal reflux disease)   . Hiatal hernia   . Hypothyroid   .  IBS (irritable bowel syndrome)   . Kidney stones   . Obsessive compulsive disorder   . Ovarian cyst   . PONV (postoperative nausea and vomiting)   . Pregnancy induced hypertension   .  Sjogren's disease ([place])   . Sjogren's syndrome ([place])   . Thyroid disease   . UTI (urinary tract infection)   . Vaginal Pap smear, abnormal   . Vision abnormalities     Past Surgical History:  Procedure Laterality Date  . ADENOIDECTOMY    . CERVIX LESION DESTRUCTION    . COLONOSCOPY    . CYSTOSCOPY    . KIDNEY STONE SURGERY    . KNEE ARTHROSCOPY    . KNEE SURGERY       Current Outpatient Medications:  .  cetirizine (ZYRTEC) 10 MG tablet, Take 10 mg by mouth daily., Disp: , Rfl:  .  cholecalciferol (VITAMIN D3) 25 MCG (1000 UT) tablet, Take 1,000 Units by mouth daily., Disp: , Rfl:  .  citalopram (CELEXA) 10 MG tablet, Take 15 mg by mouth daily., Disp: , Rfl:  .  Cyanocobalamin (B-12) 5000 MCG SUBL, Place 1 tablet under the tongue daily as needed., Disp: , Rfl:  .  Ferrous Sulfate (IRON) 325 (65 Fe) MG TABS, Take 1 tablet by mouth daily., Disp: , Rfl:  .  labetalol (NORMODYNE) 200 MG tablet, TAKE 1 TABLET BY MOUTH 2 TIMES DAILY, Disp: 60 tablet, Rfl: 6 .  levothyroxine (SYNTHROID) 50 MCG tablet, Take 1 tablet (50 mcg total) by mouth daily before breakfast., Disp: 90 tablet, Rfl: 3 .  melatonin 5 MG TABS, Take 1 tablet by mouth daily., Disp: , Rfl:  .  omeprazole (PRILOSEC) 40 MG capsule, Take 1 capsule (40 mg total) by mouth 2 (two) times daily before a meal., Disp: 60 capsule, Rfl: 2 .  Saccharomyces boulardii (PROBIOTIC) 250 MG CAPS, Take 1 capsule by mouth daily., Disp: , Rfl:    Family History  Problem Relation Age of Onset  . Thyroid disease Mother   . Hypertension Father   . Diabetes Father   . Thyroid disease Sister   . Cancer Paternal Grandmother        breast  . Diabetes Paternal Grandfather   . Heart disease Paternal Grandfather   . Depression Mother   . Atrial fibrillation Mother   . Colon polyps Father   . Arthritis Maternal Grandmother   . Heart disease Maternal Grandmother   . Hypertension Paternal Grandmother   . Hypertension Paternal Grandfather   .  Diabetes Mellitus II Sister   . Hypothyroidism Sister   . Colon cancer Neg Hx   . Esophageal cancer Neg Hx   . Rectal cancer Neg Hx   . Stomach cancer Neg Hx      Social History   Tobacco Use  . Smoking status: Former Smoker    Packs/day: 0.25    Years: 1.00    Pack years: 0.25    Types: Cigarettes    Quit date: 11/09/2011    Years since quitting: 8.5  . Smokeless tobacco: Never Used  Vaping Use  . Vaping Use: Never used  Substance Use Topics  . Alcohol use: Not Currently    Comment: occasionally   . Drug use: Never    Allergies as of 06/04/2020 - Review Complete 06/04/2020  Allergen Reaction Noted  . Soy allergy Anaphylaxis 09/23/2013  . Methylprednisolone Hives 11/27/2019  . Montelukast  02/27/2020  . Montelukast sodium Other (See Comments)     Review of  Systems:    All systems reviewed and negative except where noted in HPI.   Physical Exam:  BP 122/82 (BP Location: Left Arm, Patient Position: Sitting, Cuff Size: Normal)   Pulse 91   Temp 98 F (36.7 C) (Oral)   Ht 5\' 2"  (1.575 m)   Wt 149 lb 4 oz (67.7 kg)   LMP 05/24/2020   BMI 27.30 kg/m  Patient's last menstrual period was 05/24/2020.  General:   Alert,  Well-developed, well-nourished, pleasant and cooperative in NAD Head:  Normocephalic and atraumatic. Eyes:  Sclera clear, no icterus.   Conjunctiva pink. Ears:  Normal auditory acuity. Nose:  No deformity, discharge, or lesions. Mouth:  No deformity or lesions,oropharynx pink & moist. Neck:  Supple; no masses or thyromegaly. Lungs:  Respirations even and unlabored.  Clear throughout to auscultation.   No wheezes, crackles, or rhonchi. No acute distress. Heart:  Regular rate and rhythm; no murmurs, clicks, rubs, or gallops. Abdomen:  Normal bowel sounds. Soft, non-tender and non-distended without masses, hepatosplenomegaly or hernias noted.  No guarding or rebound tenderness.   Rectal: Normal perianal exam, severe point tenderness on digital rectal  exam, of the anterior and posterior wall of the anal canal, voluntary anal spasm Msk:  Symmetrical without gross deformities. Good, equal movement & strength bilaterally. Pulses:  Normal pulses noted. Extremities:  No clubbing or edema.  No cyanosis. Neurologic:  Alert and oriented x3;  grossly normal neurologically. Skin:  Intact without significant lesions or rashes. No jaundice. Psych:  Alert and cooperative. Normal mood and affect.  Imaging Studies: Reviewed  Assessment and Plan:   Michelle Waller is a 30 y.o. 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 twice daily before meals for 1 to 2 months  Iron deficiency Check celiac panel, recheck iron studies   Follow up in 3 months   Cephas Darby, MD

## 2020-06-05 ENCOUNTER — Encounter: Payer: Self-pay | Admitting: Gastroenterology

## 2020-06-07 ENCOUNTER — Telehealth: Payer: Self-pay

## 2020-06-07 NOTE — Telephone Encounter (Signed)
Lm for reminder of covid test prior to PFT  [facility]11/[facility]

## 2020-06-08 ENCOUNTER — Encounter: Payer: Self-pay | Admitting: Gastroenterology

## 2020-06-08 LAB — CELIAC DISEASE PANEL
Endomysial IgA: NEGATIVE
IgA/Immunoglobulin A, Serum: 102 mg/dL (ref 87–352)
Transglutaminase IgA: <2 U/mL (ref 0–3)

## 2020-06-08 LAB — IRON,TIBC AND FERRITIN PANEL
Ferritin: 64 ng/mL (ref 15–150)
Iron Saturation: 29 % (ref 15–55)
Iron: 100 ug/dL (ref 27–159)
Total Iron Binding Capacity: 342 ug/dL (ref 250–450)
UIBC: 242 ug/dL (ref 131–425)

## 2020-06-08 NOTE — Telephone Encounter (Signed)
Patient is aware of date/time of covid test prior to PFT.  

## 2020-06-09 ENCOUNTER — Telehealth: Payer: Self-pay

## 2020-06-09 NOTE — Telephone Encounter (Signed)
lmom to schedule

## 2020-06-11 ENCOUNTER — Other Ambulatory Visit: Payer: Self-pay

## 2020-06-11 ENCOUNTER — Other Ambulatory Visit
Admission: RE | Admit: 2020-06-11 | Discharge: 2020-06-11 | Disposition: A | Discharge status: Home / Self Care | Payer: No Typology Code available for payment source | Source: Ambulatory Visit | Attending: Pulmonary Disease | Admitting: Pulmonary Disease

## 2020-06-11 DIAGNOSIS — Z20822 Contact with and (suspected) exposure to covid-19: Secondary | ICD-10-CM | POA: Diagnosis not present

## 2020-06-11 DIAGNOSIS — Z01812 Encounter for preprocedural laboratory examination: Secondary | ICD-10-CM | POA: Diagnosis not present

## 2020-06-11 LAB — SARS CORONAVIRUS 2 (TAT 6-24 HRS): SARS Coronavirus 2: NEGATIVE

## 2020-06-14 ENCOUNTER — Other Ambulatory Visit: Payer: Self-pay

## 2020-06-14 ENCOUNTER — Ambulatory Visit: Payer: PRIVATE HEALTH INSURANCE | Attending: Pulmonary Disease

## 2020-06-14 DIAGNOSIS — R0602 Shortness of breath: Secondary | ICD-10-CM | POA: Diagnosis not present

## 2020-06-14 DIAGNOSIS — Z87891 Personal history of nicotine dependence: Secondary | ICD-10-CM | POA: Insufficient documentation

## 2020-06-14 MED ORDER — ALBUTEROL SULFATE (2.5 MG/3ML) 0.083% IN NEBU
2.5000 mg | INHALATION_SOLUTION | Freq: Once | RESPIRATORY_TRACT | Status: AC
  Administered 2020-06-14: 2.5 mg via RESPIRATORY_TRACT
  Filled 2020-06-14: qty 3

## 2020-06-18 ENCOUNTER — Other Ambulatory Visit: Payer: Self-pay

## 2020-06-18 MED ORDER — BREO ELLIPTA 100-25 MCG/INH IN AEPB: 1.0000 | INHALATION_SPRAY | Freq: Every day | RESPIRATORY_TRACT | 11 refills | Status: DC

## 2020-06-18 MED ORDER — BREO ELLIPTA 100-25 MCG/INH IN AEPB: 1.0000 | INHALATION_SPRAY | Freq: Every day | RESPIRATORY_TRACT | 11 refills | Status: AC

## 2020-06-28 ENCOUNTER — Other Ambulatory Visit: Payer: Self-pay

## 2020-06-28 ENCOUNTER — Ambulatory Visit (INDEPENDENT_AMBULATORY_CARE_PROVIDER_SITE_OTHER): Payer: PRIVATE HEALTH INSURANCE | Admitting: Internal Medicine

## 2020-06-28 ENCOUNTER — Encounter: Payer: Self-pay | Admitting: Internal Medicine

## 2020-06-28 VITALS — BP 110/80 | HR 83 | Temp 98.4°F | Resp 18 | Ht 62.0 in | Wt 152.2 lb

## 2020-06-28 DIAGNOSIS — R079 Chest pain, unspecified: Secondary | ICD-10-CM | POA: Diagnosis not present

## 2020-06-28 DIAGNOSIS — R6889 Other general symptoms and signs: Secondary | ICD-10-CM

## 2020-06-28 DIAGNOSIS — R0683 Snoring: Secondary | ICD-10-CM | POA: Diagnosis not present

## 2020-06-28 NOTE — Patient Instructions (Addendum)
We will have you stop the labetalol and just watch blood pressure.  We will get the home sleep test to rule out sleep apnea.

## 2020-06-28 NOTE — Progress Notes (Signed)
   Subjective:   Patient ID: Michelle Waller, female    DOB: Nov 01, 1990, 30 y.o.   MRN: [id number]  HPI The patient is a 30 YO female coming in for follow up BP and chest tightness. Has made great strides with mood since last visit and is sleeping better. Some low BP at home and as cut back on labetalol from 200 mg BID all the way down to 50 mg BID currently. Was not sure if she could just stop.   Review of Systems  Constitutional: Negative.   HENT: Negative.    Eyes: Negative.   Respiratory:  Positive for chest tightness. Negative for cough and shortness of breath.   Cardiovascular:  Negative for chest pain, palpitations and leg swelling.  Gastrointestinal:  Negative for abdominal distention, abdominal pain, constipation, diarrhea, nausea and vomiting.  Musculoskeletal: Negative.   Skin: Negative.   Neurological: Negative.   Psychiatric/Behavioral: Negative.     Objective:  Physical Exam Constitutional:      Appearance: She is well-developed.  HENT:     Head: Normocephalic and atraumatic.  Cardiovascular:     Rate and Rhythm: Normal rate and regular rhythm.  Pulmonary:     Effort: Pulmonary effort is normal. No respiratory distress.     Breath sounds: Normal breath sounds. No wheezing or rales.  Abdominal:     General: Bowel sounds are normal. There is no distension.     Palpations: Abdomen is soft.     Tenderness: There is no abdominal tenderness. There is no rebound.  Musculoskeletal:     Cervical back: Normal range of motion.  Skin:    General: Skin is warm and dry.  Neurological:     Mental Status: She is alert and oriented to person, place, and time.     Coordination: Coordination normal.    Vitals:   06/28/20 1312  BP: 110/80  Pulse: 83  Resp: 18  Temp: 98.4 F (36.9 C)  TempSrc: Oral  SpO2: 99%  Weight: 152 lb 3.2 oz (69 kg)  Height: 5\' 2"  (1.575 m)   EKG: Rate 75, axis normal, interval short PR, sinus, no st or t wave changes, no significant change  compared to prior 2022  This visit occurred during the SARS-CoV-2 public health emergency.  Safety protocols were in place, including screening questions prior to the visit, additional usage of staff PPE, and extensive cleaning of exam room while observing appropriate contact time as indicated for disinfecting solutions.   Assessment & Plan:

## 2020-07-01 ENCOUNTER — Ambulatory Visit: Payer: No Typology Code available for payment source | Admitting: Internal Medicine

## 2020-07-01 ENCOUNTER — Encounter: Payer: Self-pay | Admitting: Internal Medicine

## 2020-07-01 DIAGNOSIS — R0683 Snoring: Secondary | ICD-10-CM | POA: Insufficient documentation

## 2020-07-01 NOTE — Assessment & Plan Note (Signed)
EKG done today and unchanged. May be related to low BP and stopping labetalol.

## 2020-07-01 NOTE — Assessment & Plan Note (Signed)
BP is now much lower. Will stop labetalol and monitor off medication. This was likely post-partum and is resolving/resolved.

## 2020-07-02 ENCOUNTER — Other Ambulatory Visit: Payer: Self-pay

## 2020-07-02 ENCOUNTER — Ambulatory Visit (INDEPENDENT_AMBULATORY_CARE_PROVIDER_SITE_OTHER): Payer: PRIVATE HEALTH INSURANCE

## 2020-07-02 DIAGNOSIS — R0602 Shortness of breath: Secondary | ICD-10-CM

## 2020-07-02 LAB — ECHOCARDIOGRAM COMPLETE
AR max vel: 2.5 cm2
AV Area VTI: 2.75 cm2
AV Area mean vel: 2.56 cm2
AV Mean grad: 4 mmHg
AV Peak grad: 7.1 mmHg
Ao pk vel: 1.33 m/s
Area-P 1/2: 3.11 cm2
Calc EF: 63.7 %
S' Lateral: 2.9 cm
Single Plane A2C EF: 64.9 %
Single Plane A4C EF: 62.3 %

## 2020-07-12 ENCOUNTER — Telehealth: Payer: Self-pay | Admitting: Internal Medicine

## 2020-07-12 MED ORDER — NITROFURANTOIN MONOHYD MACRO 100 MG PO CAPS: 100.0000 mg | ORAL_CAPSULE | Freq: Two times a day (BID) | ORAL | 0 refills | Status: DC

## 2020-07-12 NOTE — Telephone Encounter (Signed)
Sent in macrobid to take 1 pill twice a day for 1 week. If no improvement let us know and we can order urine test.

## 2020-07-12 NOTE — Telephone Encounter (Signed)
Spoke with patient today. 

## 2020-07-12 NOTE — Telephone Encounter (Signed)
   Patient calling to request order for urine test; painful urination Patient declined appointment, no insurance

## 2020-07-14 ENCOUNTER — Telehealth: Payer: Self-pay

## 2020-07-14 NOTE — Telephone Encounter (Signed)
Called and spoke with patient in regards to her test results, pt had a clear understanding.pt would like to know if there is another option other than Breo that that she can try for a reasonable price?

## 2020-07-14 NOTE — Telephone Encounter (Signed)
I have responded to this message previously, I think do not know where the message went.  She needs to check with her insurance company to see what would be most economical under her plan.  Once she finds out from her insurance company she needs to let us know and then we can prescribe what is needed.

## 2020-07-14 NOTE — Telephone Encounter (Signed)
-----   Message from Tyler Pita, MD sent at 07/09/2020  2:40 PM EDT ----- She would have to check with her insurance as to what is lower tier coverage for her.  Most everything unfortunately is going to be costly however, if she checks with her insurance we will be happy to provide what ever they have as a lower tier. ----- Message ----- From: Carlisle Cater, CMA Sent: 07/09/2020   2:05 PM EDT To: Tyler Pita, MD   Called and spoke with patient in regards to her test results, pt had a clear understanding.pt would like to know if there is another option other than Breo that that she can try for a reasonable price?

## 2020-07-14 NOTE — Telephone Encounter (Signed)
Lm x1 for patient.  

## 2020-07-14 NOTE — Telephone Encounter (Signed)
Called and spoke with patient in regards to her test results, pt had a clear understanding.pt would like to know if there is another option other than [facility] that she can try for a reasonable price?

## 2020-07-15 ENCOUNTER — Telehealth: Payer: Self-pay

## 2020-07-15 NOTE — Telephone Encounter (Signed)
Lm x2 for patient Will close encounter per office protocol. Letter mailed to address on file.    

## 2020-07-15 NOTE — Telephone Encounter (Signed)
-----   Message from Tyler Pita, MD sent at 07/09/2020  2:40 PM EDT ----- She would have to check with her insurance as to what is lower tier coverage for her.  Most everything unfortunately is going to be costly however, if she checks with her insurance we will be happy to provide what ever they have as a lower tier. ----- Message ----- From: Carlisle Cater, CMA Sent: 07/09/2020   2:05 PM EDT To: Tyler Pita, MD   Called and spoke with patient in regards to her test results, pt had a clear understanding.pt would like to know if there is another option other than Breo that that she can try for a reasonable price?

## 2020-07-15 NOTE — Progress Notes (Unsigned)
Called pt today in regards of medications options pt did not answer. D/t the number policy letter will be mailed to the patient and this encounter will now be closed.

## 2020-07-15 NOTE — Telephone Encounter (Signed)
Created in Error

## 2020-07-21 ENCOUNTER — Emergency Department (HOSPITAL_COMMUNITY): Payer: PRIVATE HEALTH INSURANCE

## 2020-07-21 ENCOUNTER — Ambulatory Visit (INDEPENDENT_AMBULATORY_CARE_PROVIDER_SITE_OTHER): Payer: PRIVATE HEALTH INSURANCE | Admitting: Urology

## 2020-07-21 ENCOUNTER — Emergency Department (HOSPITAL_COMMUNITY)
Admission: EM | Admit: 2020-07-21 | Discharge: 2020-07-21 | Disposition: A | Discharge status: Home / Self Care | Payer: PRIVATE HEALTH INSURANCE | Attending: Emergency Medicine | Admitting: Emergency Medicine

## 2020-07-21 ENCOUNTER — Encounter: Payer: Self-pay | Admitting: Urology

## 2020-07-21 ENCOUNTER — Other Ambulatory Visit: Payer: Self-pay | Admitting: *Deleted

## 2020-07-21 ENCOUNTER — Encounter: Payer: Self-pay | Admitting: Internal Medicine

## 2020-07-21 ENCOUNTER — Other Ambulatory Visit: Payer: Self-pay

## 2020-07-21 ENCOUNTER — Encounter (HOSPITAL_COMMUNITY): Payer: Self-pay

## 2020-07-21 ENCOUNTER — Telehealth: Payer: Self-pay | Admitting: *Deleted

## 2020-07-21 VITALS — BP 125/83 | HR 80 | Temp 97.9°F | Ht 62.0 in | Wt 147.0 lb

## 2020-07-21 DIAGNOSIS — N200 Calculus of kidney: Secondary | ICD-10-CM

## 2020-07-21 DIAGNOSIS — Z87442 Personal history of urinary calculi: Secondary | ICD-10-CM | POA: Diagnosis not present

## 2020-07-21 DIAGNOSIS — R300 Dysuria: Secondary | ICD-10-CM | POA: Insufficient documentation

## 2020-07-21 DIAGNOSIS — R350 Frequency of micturition: Secondary | ICD-10-CM | POA: Diagnosis not present

## 2020-07-21 DIAGNOSIS — Z5321 Procedure and treatment not carried out due to patient leaving prior to being seen by health care provider: Secondary | ICD-10-CM | POA: Insufficient documentation

## 2020-07-21 DIAGNOSIS — R109 Unspecified abdominal pain: Secondary | ICD-10-CM | POA: Diagnosis not present

## 2020-07-21 LAB — BASIC METABOLIC PANEL
Anion gap: 7 (ref 5–15)
BUN: 11 mg/dL (ref 6–20)
CO2: 26 mmol/L (ref 22–32)
Calcium: 8.9 mg/dL (ref 8.9–10.3)
Chloride: 102 mmol/L (ref 98–111)
Creatinine, Ser: 0.56 mg/dL (ref 0.44–1.00)
GFR, Estimated: >60 mL/min (ref >60)
Glucose, Bld: 90 mg/dL (ref 70–99)
Potassium: 4.1 mmol/L (ref 3.5–5.1)
Sodium: 135 mmol/L (ref 135–145)

## 2020-07-21 LAB — CBC
HCT: 38 % (ref 36.0–46.0)
Hemoglobin: 13 g/dL (ref 12.0–15.0)
MCH: 31.3 pg (ref 26.0–34.0)
MCHC: 34.2 g/dL (ref 30.0–36.0)
MCV: 91.3 fL (ref 80.0–100.0)
Platelets: 181 10*3/uL (ref 150–400)
RBC: 4.16 MIL/uL (ref 3.87–5.11)
RDW: 11.1 % — ABNORMAL LOW (ref 11.5–15.5)
WBC: 3.4 10*3/uL — ABNORMAL LOW (ref 4.0–10.5)
nRBC: 0 % (ref 0.0–0.2)

## 2020-07-21 LAB — I-STAT BETA HCG BLOOD, ED (MC, WL, AP ONLY): I-stat hCG, quantitative: <5 m[IU]/mL (ref <5)

## 2020-07-21 MED ORDER — TAMSULOSIN HCL 0.4 MG PO CAPS: 0.4000 mg | ORAL_CAPSULE | Freq: Every day | ORAL | 0 refills | Status: DC

## 2020-07-21 MED ORDER — OXYCODONE-ACETAMINOPHEN 10-325 MG PO TABS: 1.0000 | ORAL_TABLET | ORAL | 0 refills | Status: DC | PRN

## 2020-07-21 NOTE — ED Provider Notes (Signed)
Emergency Medicine Provider Triage Evaluation Note  Michelle Waller , a 30 y.o. female  was evaluated in triage.  Pt complains of left-sided back/flank pain.  She states she thinks she is passing a kidney stone.  She complains of pain in her urethra with frequency and burning.  She was started about a week ago where her PCP on Bactrim with little relief.  She does think this is more kidney stone related.  She has taken Tylenol for pain.  She has an extensive history of kidney stones.  Denies any nausea or vomiting.  No fevers or chills  Review of Systems  Positive: As above Negative: As above  Physical Exam  BP 139/84 (BP Location: Left Arm)   Pulse 100   Temp 97.8 F (36.6 C) (Oral)   Resp 18   LMP 07/15/2020 (Approximate)   SpO2 100%  Gen:   Awake, no distress   Resp:  Normal effort  MSK:   Moves extremities without difficulty  Other:  Minimal left-sided flank tenderness  Medical Decision Making  Medically screening exam initiated at 12:14 PM.  Appropriate orders placed.  Sean Macwilliams was informed that the remainder of the evaluation will be completed by another provider, this initial triage assessment does not replace that evaluation, and the importance of remaining in the ED until their evaluation is complete.     Garald Balding, PA-C 07/21/20 1217    Gareth [place], MD 07/22/20 [phone]

## 2020-07-21 NOTE — Telephone Encounter (Signed)
Left voicemail to get KUB prior to appointment.

## 2020-07-21 NOTE — Progress Notes (Signed)
07/21/2020 4:36 PM   [name] 1990-10-01 [id number]  Referring provider: Hoyt Koch, MD [address] [place],  [place] [address]  Urological history: 1. Nephrolithiasis -6 prior spontaneously passed kidney stones, as well as 1 requiring ureteroscopy and laser lithotripsy in [place] previously -stone composition of calcium oxalate   2. IC  -asymptomatic   Chief Complaint  Patient presents with   Flank Pain    HPI: Michelle Waller is a 30 y.o. female who presents today for flank pain.    She has been having urethral and bladder pain for about a week that have been unresponsive to the Macrobid prescribed by her primary care physician.  She states that this morning she had a sudden onset of left-sided flank pain and realized that this is likely due to a kidney stone.  She was seen in the ED and a KUB was taken which demonstrated bilateral nephrolithiasis and also calcification in the area of the left distal ureter 4 mm in size suspicious for stone.  Patient denies any modifying or aggravating factors.  Patient denies any gross hematuria, dysuria or suprapubic/flank pain.  Patient denies any fevers, nausea or vomiting.    UA clear   PMH: Past Medical History:  Diagnosis Date   Allergy    Anxiety    Asthma    physical induced asthma    Eating disorder    Family history of adverse reaction to anesthesia    Frequent headaches    Gastritis    GERD (gastroesophageal reflux disease)    Hiatal hernia    Hypothyroid    IBS (irritable bowel syndrome)    Kidney stones    Obsessive compulsive disorder    Ovarian cyst    PONV (postoperative nausea and vomiting)    Pregnancy induced hypertension    Sjogren's disease ([place])    Sjogren's syndrome ([facility])    Thyroid disease    UTI (urinary tract infection)    Vaginal Pap smear, abnormal    Vision abnormalities     Surgical History: Past Surgical History:  Procedure Laterality Date   ADENOIDECTOMY      CERVIX LESION DESTRUCTION     COLONOSCOPY     CYSTOSCOPY     KIDNEY STONE SURGERY     KNEE ARTHROSCOPY     KNEE SURGERY      Home Medications:  Allergies as of 07/21/2020       Reactions   Soy Allergy Anaphylaxis   Large amounts cause anaphylaxis Small amounts cause upset stomach   Methylprednisolone Hives   Other reaction(s): swelling/hives   Montelukast    Montelukast Sodium Other (See Comments)   Bad dreams, nightmares        Medication List        Accurate as of July 21, 2020  4:36 PM. If you have any questions, ask your nurse or doctor.          STOP taking these medications    nitrofurantoin (macrocrystal-monohydrate) 100 MG capsule Commonly known as: Macrobid Stopped by: Zara Council, PA-C       TAKE these medications    B-12 5000 MCG Subl Place 1 tablet under the tongue daily as needed.   cetirizine 10 MG tablet Commonly known as: ZYRTEC Take 10 mg by mouth daily.   cholecalciferol 25 MCG (1000 UNIT) tablet Commonly known as: VITAMIN D3 Take 1,000 Units by mouth daily.   escitalopram 5 MG tablet Commonly known as: LEXAPRO Take 5 mg by mouth  every morning.   Iron 325 (65 Fe) MG Tabs Take 1 tablet by mouth daily.   levothyroxine 50 MCG tablet Commonly known as: Synthroid Take 1 tablet (50 mcg total) by mouth daily before breakfast.   omeprazole 40 MG capsule Commonly known as: PRILOSEC Take 1 capsule (40 mg total) by mouth 2 (two) times daily before a meal.   oxyCODONE-acetaminophen 10-325 MG tablet Commonly known as: Percocet Take 1 tablet by mouth every 4 (four) hours as needed for pain. Started by: Zara Council, PA-C   Probiotic 250 MG Caps Take 1 capsule by mouth daily.   tamsulosin 0.4 MG Caps capsule Commonly known as: FLOMAX Take 1 capsule (0.4 mg total) by mouth daily. Started by: Zara Council, PA-C        Allergies:  Allergies  Allergen Reactions   Soy Allergy Anaphylaxis    Large amounts cause  anaphylaxis Small amounts cause upset stomach   Methylprednisolone Hives    Other reaction(s): swelling/hives   Montelukast    Montelukast Sodium Other (See Comments)    Bad dreams, nightmares    Family History: Family History  Problem Relation Age of Onset   Thyroid disease Mother    Hypertension Father    Diabetes Father    Thyroid disease Sister    Cancer Paternal Grandmother        breast   Diabetes Paternal Grandfather    Heart disease Paternal Grandfather    Depression Mother    Atrial fibrillation Mother    Colon polyps Father    Arthritis Maternal Grandmother    Heart disease Maternal Grandmother    Hypertension Paternal Grandmother    Hypertension Paternal Grandfather    Diabetes Mellitus II Sister    Hypothyroidism Sister    Colon cancer Neg Hx    Esophageal cancer Neg Hx    Rectal cancer Neg Hx    Stomach cancer Neg Hx     Social History:  reports that she quit smoking about 8 years ago. Her smoking use included cigarettes. She has a 0.25 pack-year smoking history. She has never used smokeless tobacco. She reports previous alcohol use. She reports that she does not use drugs.  ROS: Pertinent ROS in HPI  Physical Exam: BP 125/83   Pulse 80   Temp 97.9 F (36.6 C) (Oral)   Ht '5\' 2"'  (1.575 m)   Wt 147 lb (66.7 kg)   LMP 07/15/2020 (Approximate)   BMI 26.89 kg/m   Constitutional:  Well nourished. Alert and oriented, No acute distress. HEENT: [place] AT, mask in place.  Trachea midline Cardiovascular: No clubbing, cyanosis, or edema. Respiratory: Normal respiratory effort, no increased work of breathing. Neurologic: Grossly intact, no focal deficits, moving all 4 extremities. Psychiatric: Normal mood and affect.  Laboratory Data: Lab Results  Component Value Date   WBC 3.4 (L) 07/21/2020   HGB 13.0 07/21/2020   HCT 38.0 07/21/2020   MCV 91.3 07/21/2020   PLT 181 07/21/2020    Lab Results  Component Value Date   CREATININE 0.56 07/21/2020    Lab  Results  Component Value Date   HGBA1C 5.3 08/09/2016    Lab Results  Component Value Date   TSH 0.29 (L) 03/03/2020    Lab Results  Component Value Date   AST 27 03/09/2020   Lab Results  Component Value Date   ALT 31 03/09/2020    Urinalysis Component     Latest Ref Rng & Units 07/21/2020  Specific Gravity, UA     1.005 - 1.030 <1.005 (L)  pH, UA     5.0 - 7.5 6.5  Color, UA     Yellow Straw  Appearance Ur     Clear Hazy (A)  Leukocytes,UA     Negative Negative  Protein,UA     Negative/Trace Negative  Glucose, UA     Negative Negative  Ketones, UA     Negative Negative  RBC, UA     Negative Trace (A)  Bilirubin, UA     Negative Negative  Urobilinogen, Ur     0.2 - 1.0 mg/dL 0.2  Nitrite, UA     Negative Negative  Microscopic Examination      See below:   Component     Latest Ref Rng & Units 07/21/2020         3:17 PM  WBC, UA     0 - 5 /hpf 0-5  RBC     0 - 2 /hpf 0-2  Epithelial Cells (non renal)     0 - 10 /hpf 0-10  Bacteria, UA     None seen/Few Few  I have reviewed the labs.   Pertinent Imaging: CLINICAL DATA:  Kidney stones.   EXAM: ABDOMEN - 1 VIEW   COMPARISON:  09/25/2018   FINDINGS: Bilateral kidney stones are again noted. Approximately 4 stones are noted within the mid and inferior pole of the right kidney measuring up to the 5 mm. On the left there are 5 stones measuring up to 4 mm. Within the left side of pelvis there is a small calcification measuring 4 mm. Not confidently identified on previous imaging. Bowel gas pattern appears normal.   IMPRESSION: 1. Bilateral renal calculi. 2. 4 mm calcification within the left side of pelvis is indeterminate. Cannot exclude distal left ureteral calculi.     Electronically Signed   By: Kerby Moors M.D.   On: 07/21/2020 13:03 I have independently reviewed the films.  See HPI.   Assessment & Plan:    1. Kidney stones -Urinalysis, Complete -CULTURE, URINE  COMPREHENSIVE -KUB 4 mm left calcification suspicious for a proximal ureteral stone -We discussed various treatment options for urolithiasis including observation with or without medical expulsive therapy, shockwave lithotripsy (SWL), ureteroscopy and laser lithotripsy with stent placement.  -We discussed that management is based on stone size, location, density, patient co-morbidities, and patient preference.  -Stones <4m in size have a >80% spontaneous passage rate. Data surrounding the use of tamsulosin for medical expulsive therapy is controversial, but meta analyses suggests it is most efficacious for distal stones between 5-149min size. Possible side effects include dizziness/lightheadedness -ESWL has a lower stone free rate in a single procedure, but also a lower complication rate compared to ureteroscopy and avoids a stent and associated stent related symptoms. Possible complications include renal hematoma, steinstrasse, and need for additional treatment. -Ureteroscopy with laser lithotripsy and stent placement has a higher stone free rate than SWL in a single procedure, however increased complication rate including possible infection, ureteral injury, bleeding, and stent related morbidity. Common stent related symptoms include dysuria, urgency/frequency, and flank pain. -She would like to proceed with medical expulsive therapy, so I sent a prescription for tamsulosin 0.4 mg and Percocet 10/26/2023, #10 1 every 4 hours.  For pain -We will go ahead and order CT renal stone study in the interim to confirm that this is a stone in preparation for failure of MET -She will return in 1 week for  KUB office visit -She is given a strainer instructed to strain her urine -Patient is advised that if they should start to experience pain that is not able to be controlled with pain medication, intractable nausea and/or vomiting and/or fevers greater than 103 or shaking chills to contact the office immediately or  seek treatment in the emergency department for emergent intervention.       Return in about 1 week (around 07/28/2020) for KUB and office visit .  These notes generated with voice recognition software. I apologize for typographical errors.  Zara Council, PA-C  [facility] [address]  [place], [place] [address] [phone]

## 2020-07-21 NOTE — Telephone Encounter (Addendum)
Patient called to report flank pain-thinks she is passing a stone-denies fever, chills or any other symptoms. Made same day appointment for evaluation.

## 2020-07-21 NOTE — ED Triage Notes (Signed)
Patient reports she is passing a kidney stone X1 week.   C/o left flank pain 7/10 C/o urgency, frequency, and burning.   Hx: Kidney stones   Recent CT done showing multiple kidney stones.    A/ox4 Ambulatory in triage

## 2020-07-22 LAB — URINALYSIS, COMPLETE
Bilirubin, UA: NEGATIVE
Glucose, UA: NEGATIVE
Ketones, UA: NEGATIVE
Leukocytes,UA: NEGATIVE
Nitrite, UA: NEGATIVE
Protein,UA: NEGATIVE
Specific Gravity, UA: <1.005 — ABNORMAL LOW (ref 1.005–1.030)
Urobilinogen, Ur: 0.2 mg/dL (ref 0.2–1.0)
pH, UA: 6.5 (ref 5.0–7.5)

## 2020-07-22 LAB — MICROSCOPIC EXAMINATION

## 2020-07-24 LAB — CULTURE, URINE COMPREHENSIVE

## 2020-07-27 NOTE — Progress Notes (Signed)
07/28/2020 1:54 PM   Michelle Waller 1990-10-07 [id number]  Referring provider: Hoyt Koch, MD [address] [place],  [place] [address]  Urological history: 1. Nephrolithiasis -6 prior spontaneously passed kidney stones, as well as 1 requiring ureteroscopy and laser lithotripsy in [place] previously -stone composition of calcium oxalate   2. IC  -asymptomatic   Chief Complaint  Patient presents with   Nephrolithiasis     HPI: Michelle Waller is a 30 y.o. female who presents today for follow up KUB for a likely left ureteral stone.   She had a relatively quiet week until yesterday when she had intense left-sided flank pain for which she had to take a Percocet.  She is pain-free today.  She has not passed a fragment.  Patient denies any modifying or aggravating factors.  Patient denies any gross hematuria, dysuria or suprapubic/flank pain.  Patient denies any fevers, chills, nausea or vomiting.    KUB today shows the persistence of the left distal calculus that is suspicious for stone.  PMH: Past Medical History:  Diagnosis Date   Allergy    Anxiety    Asthma    physical induced asthma    Eating disorder    Family history of adverse reaction to anesthesia    Frequent headaches    Gastritis    GERD (gastroesophageal reflux disease)    Hiatal hernia    Hypothyroid    IBS (irritable bowel syndrome)    Kidney stones    Obsessive compulsive disorder    Ovarian cyst    PONV (postoperative nausea and vomiting)    Pregnancy induced hypertension    Sjogren's disease ([place])    Sjogren's syndrome ([facility])    Thyroid disease    UTI (urinary tract infection)    Vaginal Pap smear, abnormal    Vision abnormalities     Surgical History: Past Surgical History:  Procedure Laterality Date   ADENOIDECTOMY     CERVIX LESION DESTRUCTION     COLONOSCOPY     CYSTOSCOPY     KIDNEY STONE SURGERY     KNEE ARTHROSCOPY     KNEE SURGERY      Home  Medications:  Allergies as of 07/28/2020       Reactions   Soy Allergy Anaphylaxis   Large amounts cause anaphylaxis Small amounts cause upset stomach   Methylprednisolone Hives   Other reaction(s): swelling/hives   Montelukast    Montelukast Sodium Other (See Comments)   Bad dreams, nightmares        Medication List        Accurate as of July 28, 2020 11:59 PM. If you have any questions, ask your nurse or doctor.          B-12 5000 MCG Subl Place 1 tablet under the tongue daily as needed.   cetirizine 10 MG tablet Commonly known as: ZYRTEC Take 10 mg by mouth daily.   cholecalciferol 25 MCG (1000 UNIT) tablet Commonly known as: VITAMIN D3 Take 1,000 Units by mouth daily.   escitalopram 5 MG tablet Commonly known as: LEXAPRO Take 5 mg by mouth daily.   Iron 325 (65 Fe) MG Tabs Take 325 mg by mouth daily.   levothyroxine 50 MCG tablet Commonly known as: Synthroid Take 1 tablet (50 mcg total) by mouth daily before breakfast.   omeprazole 40 MG capsule Commonly known as: PRILOSEC Take 1 capsule (40 mg total) by mouth 2 (two) times daily before a meal.   oxyCODONE-acetaminophen 10-325  MG tablet Commonly known as: Percocet Take 1 tablet by mouth every 4 (four) hours as needed for pain.   Probiotic 250 MG Caps Take 1 capsule by mouth daily.   tamsulosin 0.4 MG Caps capsule Commonly known as: FLOMAX Take 1 capsule (0.4 mg total) by mouth daily.        Allergies:  Allergies  Allergen Reactions   Soy Allergy Anaphylaxis    Large amounts cause anaphylaxis Small amounts cause upset stomach   Methylprednisolone Hives and Swelling   Montelukast     Nightmares     Family History: Family History  Problem Relation Age of Onset   Thyroid disease Mother    Hypertension Father    Diabetes Father    Thyroid disease Sister    Cancer Paternal Grandmother        breast   Diabetes Paternal Grandfather    Heart disease Paternal Grandfather    Depression  Mother    Atrial fibrillation Mother    Colon polyps Father    Arthritis Maternal Grandmother    Heart disease Maternal Grandmother    Hypertension Paternal Grandmother    Hypertension Paternal Grandfather    Diabetes Mellitus II Sister    Hypothyroidism Sister    Colon cancer Neg Hx    Esophageal cancer Neg Hx    Rectal cancer Neg Hx    Stomach cancer Neg Hx     Social History:  reports that she quit smoking about 8 years ago. Her smoking use included cigarettes. She has a 0.25 pack-year smoking history. She has never used smokeless tobacco. She reports previous alcohol use. She reports that she does not use drugs.  ROS: Pertinent ROS in HPI  Physical Exam: BP 116/75   Pulse 82   Ht 5\' 2"  (1.575 m)   Wt 152 lb (68.9 kg)   LMP 07/15/2020 (Approximate)   BMI 27.80 kg/m   Constitutional:  Well nourished. Alert and oriented, No acute distress. HEENT: [place] AT, mask in place.  Trachea midline Cardiovascular: No clubbing, cyanosis, or edema. Respiratory: Normal respiratory effort, no increased work of breathing. Neurologic: Grossly intact, no focal deficits, moving all 4 extremities. Psychiatric: Normal mood and affect.    Laboratory Data: N/A  Pertinent Imaging: CLINICAL DATA:  Left flank pain for 2 weeks.   EXAM: ABDOMEN - 1 VIEW   COMPARISON:  07/21/2020   FINDINGS: Stable appearing bilateral renal calculi. No obvious renal calculi. Stable calcification in the pelvis just to the left of midline. This is indeterminate.   IMPRESSION: 1. Stable bilateral renal calculi. 2. Stable indeterminate calcification in the left pelvis.     Electronically Signed   By: Michelle Waller M.D.   On: 07/29/2020 18:32 I have independently reviewed the films.  See HPI.    Assessment & Plan:    1. Kidney stones -KUB 4 mm left calcification suspicious for a proximal ureteral stone -We discussed various treatment options for urolithiasis including observation with or without medical  expulsive therapy, shockwave lithotripsy (SWL), ureteroscopy and laser lithotripsy with stent placement.  -she would like to pursue left ESWL -ESWL has a lower stone free rate in a single procedure, but also a lower complication rate compared to ureteroscopy and avoids a stent and associated stent related symptoms. Possible complications include renal hematoma, steinstrasse, and need for additional treatment. -continue tamsulosin 0.4 mg and Percocet 10/26/2023, #10 1 every 4 hours.  For pain -She is given a strainer instructed to strain her urine -her insurance would  not cover ESWL, so she has been scheduled for left URS/LL/ureteral stent placement - explained to the patient how the procedure is performed and the risks involved - informed patient that they will have a stent placed during the procedure and will remain in place after the procedure for a short time.  - stent may be removed in the office with a cystoscope or patient may be instructed to remove the stent themselves by the string - described "stent pain" as feelings of needing to urinate/overactive bladder and a warm, tingling sensation to intense pain in the affected flank - residual stones within the kidney or ureter may be present after the procedure and may need to have these addressed at a different encounter - injury to the ureter is the most common intra-operative risk, it may result in an open procedure to correct the defect - infection and bleeding are also risks - explained the risks of general anesthesia, such as: MI, CVA, paralysis, coma and/or death. -Patient is advised that if they should start to experience pain that is not able to be controlled with pain medication, intractable nausea and/or vomiting and/or fevers greater than 103 or shaking chills to contact the office immediately or seek treatment in the emergency department for emergent intervention.       Return for left URS/LL/ureteral stent placement.  These notes  generated with voice recognition software. I apologize for typographical errors.  Zara Council, PA-C  [facility] [address]  [place], [place] [address] [phone]

## 2020-07-28 ENCOUNTER — Ambulatory Visit
Admission: RE | Admit: 2020-07-28 | Discharge: 2020-07-28 | Disposition: A | Discharge status: Home / Self Care | Payer: 59 | Source: Ambulatory Visit | Attending: Physician Assistant | Admitting: Physician Assistant

## 2020-07-28 ENCOUNTER — Ambulatory Visit (INDEPENDENT_AMBULATORY_CARE_PROVIDER_SITE_OTHER): Payer: 59 | Admitting: Urology

## 2020-07-28 ENCOUNTER — Other Ambulatory Visit: Payer: Self-pay

## 2020-07-28 ENCOUNTER — Ambulatory Visit
Admission: RE | Admit: 2020-07-28 | Discharge: 2020-07-28 | Disposition: A | Discharge status: Home / Self Care | Payer: 59 | Source: Ambulatory Visit | Attending: Urology | Admitting: Urology

## 2020-07-28 ENCOUNTER — Encounter: Payer: Self-pay | Admitting: Urology

## 2020-07-28 VITALS — BP 116/75 | HR 82 | Ht 62.0 in | Wt 152.0 lb

## 2020-07-28 DIAGNOSIS — N200 Calculus of kidney: Secondary | ICD-10-CM

## 2020-08-03 ENCOUNTER — Other Ambulatory Visit: Payer: Self-pay | Admitting: Urology

## 2020-08-03 DIAGNOSIS — N200 Calculus of kidney: Secondary | ICD-10-CM

## 2020-08-03 NOTE — Progress Notes (Signed)
KUB order is in .

## 2020-08-04 ENCOUNTER — Other Ambulatory Visit: Payer: Self-pay

## 2020-08-04 ENCOUNTER — Encounter
Admission: RE | Admit: 2020-08-04 | Discharge: 2020-08-04 | Disposition: A | Discharge status: Home / Self Care | Payer: 59 | Source: Ambulatory Visit | Attending: Urology | Admitting: Urology

## 2020-08-04 ENCOUNTER — Other Ambulatory Visit: Payer: Self-pay | Admitting: Urology

## 2020-08-04 DIAGNOSIS — N201 Calculus of ureter: Secondary | ICD-10-CM

## 2020-08-04 HISTORY — DX: Anemia, unspecified: D64.9

## 2020-08-04 NOTE — Patient Instructions (Addendum)
Your procedure is scheduled on:08-06-20 Friday Report to the Registration Desk on the 1st floor of the [facility]-Then proceed to the 2nd floor Surgery Desk in the [place] To find out your arrival time, please call [phone] between 1PM - 3PM on:08-05-20 Thursday  REMEMBER: Instructions that are not followed completely may result in serious medical risk, up to and including death; or upon the discretion of your surgeon and anesthesiologist your surgery may need to be rescheduled.  Do not eat food or drink any liquids after midnight the night before surgery.  No gum chewing, lozengers or hard candies.  TAKE THESE MEDICATIONS THE MORNING OF SURGERY WITH A SIP OF WATER: -Synthroid (Levothyroxine) -Prilosec (Omeprazole)  One week prior to surgery: Stop Anti-inflammatories (NSAIDS) such as Advil, Aleve, Ibuprofen, Motrin, Naproxen, Naprosyn and Aspirin based products such as Excedrin, Goodys Powder, BC Powder.You may however, take Tylenol if needed for pain up until the day of surgery.  Stop ANY OVER THE COUNTER supplements/vitamins NOW (08-04-20) until after surgery.However, continue your iron pill up until the day prior to surgery   No Alcohol for 24 hours before or after surgery.  No Smoking including e-cigarettes for 24 hours prior to surgery.  No chewable tobacco products for at least 6 hours prior to surgery.  No nicotine patches on the day of surgery.  Do not use any "recreational" drugs for at least a week prior to your surgery.  Please be advised that the combination of cocaine and anesthesia may have negative outcomes, up to and including death. If you test positive for cocaine, your surgery will be cancelled.  On the morning of surgery brush your teeth with toothpaste and water, you may rinse your mouth with mouthwash if you wish. Do not swallow any toothpaste or mouthwash.  Do not wear jewelry, make-up, hairpins, clips or nail polish.  Do not wear lotions, powders,  or perfumes.   Do not shave body from the neck down 48 hours prior to surgery just in case you cut yourself which could leave a site for infection.  Also, freshly shaved skin may become irritated if using the CHG soap.  Contact lenses, hearing aids and dentures may not be worn into surgery.  Do not bring valuables to the hospital. [facility] is not responsible for any missing/lost belongings or valuables.   Notify your doctor if there is any change in your medical condition (cold, fever, infection).  Wear comfortable clothing (specific to your surgery type) to the hospital.  After surgery, you can help prevent lung complications by doing breathing exercises.  Take deep breaths and cough every 1-2 hours. Your doctor may order a device called an Incentive Spirometer to help you take deep breaths. When coughing or sneezing, hold a pillow firmly against your incision with both hands. This is called "splinting." Doing this helps protect your incision. It also decreases belly discomfort.  If you are being admitted to the hospital overnight, leave your suitcase in the car. After surgery it may be brought to your room.  If you are being discharged the day of surgery, you will not be allowed to drive home. You will need a responsible adult (18 years or older) to drive you home and stay with you that night.   If you are taking public transportation, you will need to have a responsible adult (18 years or older) with you. Please confirm with your physician that it is acceptable to use public transportation.   Please call the Pre-admissions  Testing Dept. at [phone] if you have any questions about these instructions.  Surgery Visitation Policy:  Patients undergoing a surgery or procedure may have one family member or support person with them as long as that person is not COVID-19 positive or experiencing its symptoms.  That person may remain in the waiting area during the  procedure.  Inpatient Visitation:    Visiting hours are 7 a.m. to 8 p.m. Inpatients will be allowed two visitors daily. The visitors may change each day during the patient's stay. No visitors under the age of 66. Any visitor under the age of 70 must be accompanied by an adult. The visitor must pass COVID-19 screenings, use hand sanitizer when entering and exiting the patient's room and wear a mask at all times, including in the patient's room. Patients must also wear a mask when staff or their visitor are in the room. Masking is required regardless of vaccination status.

## 2020-08-05 ENCOUNTER — Ambulatory Visit (HOSPITAL_COMMUNITY)
Admission: RE | Admit: 2020-08-05 | Discharge: 2020-08-05 | Disposition: A | Discharge status: Home / Self Care | Payer: 59 | Source: Ambulatory Visit | Attending: Urology | Admitting: Urology

## 2020-08-05 ENCOUNTER — Encounter: Admission: RE | Payer: Self-pay | Source: Home / Self Care

## 2020-08-05 ENCOUNTER — Ambulatory Visit: Admission: RE | Admit: 2020-08-05 | Payer: 59 | Source: Home / Self Care | Admitting: Urology

## 2020-08-05 DIAGNOSIS — N200 Calculus of kidney: Secondary | ICD-10-CM | POA: Diagnosis not present

## 2020-08-05 SURGERY — LITHOTRIPSY, ESWL
Anesthesia: Moderate Sedation | Laterality: Left

## 2020-08-06 ENCOUNTER — Ambulatory Visit: Admission: RE | Admit: 2020-08-06 | Payer: 59 | Source: Home / Self Care | Admitting: Urology

## 2020-08-06 ENCOUNTER — Encounter: Admission: RE | Payer: Self-pay | Source: Home / Self Care

## 2020-08-06 SURGERY — CYSTOSCOPY/URETEROSCOPY/HOLMIUM LASER/STENT PLACEMENT
Anesthesia: Choice | Laterality: Left

## 2020-08-12 ENCOUNTER — Other Ambulatory Visit: Payer: Self-pay | Admitting: Urology

## 2020-08-12 DIAGNOSIS — R109 Unspecified abdominal pain: Secondary | ICD-10-CM

## 2020-08-12 DIAGNOSIS — N200 Calculus of kidney: Secondary | ICD-10-CM

## 2020-08-16 ENCOUNTER — Other Ambulatory Visit: Payer: Self-pay

## 2020-08-16 ENCOUNTER — Ambulatory Visit
Admission: RE | Admit: 2020-08-16 | Discharge: 2020-08-16 | Disposition: A | Discharge status: Home / Self Care | Payer: 59 | Source: Ambulatory Visit | Attending: Urology | Admitting: Urology

## 2020-08-16 DIAGNOSIS — R109 Unspecified abdominal pain: Secondary | ICD-10-CM | POA: Diagnosis not present

## 2020-08-18 NOTE — Telephone Encounter (Signed)
The finding is very small.  Likely related to prior infection or inflammation.  Nothing to worry about.

## 2020-08-18 NOTE — Telephone Encounter (Signed)
Dr. Gonzalez, please advise. Thanks 

## 2020-08-23 ENCOUNTER — Telehealth: Payer: 59 | Admitting: Nurse Practitioner

## 2020-08-23 ENCOUNTER — Encounter: Payer: Self-pay | Admitting: Nurse Practitioner

## 2020-08-23 DIAGNOSIS — B379 Candidiasis, unspecified: Secondary | ICD-10-CM

## 2020-08-23 DIAGNOSIS — H6690 Otitis media, unspecified, unspecified ear: Secondary | ICD-10-CM

## 2020-08-23 MED ORDER — FLUCONAZOLE 150 MG PO TABS: 150.0000 mg | ORAL_TABLET | Freq: Once | ORAL | 0 refills | Status: AC

## 2020-08-23 MED ORDER — AMOXICILLIN-POT CLAVULANATE 875-125 MG PO TABS: 1.0000 | ORAL_TABLET | Freq: Two times a day (BID) | ORAL | 0 refills | Status: AC

## 2020-08-23 NOTE — Progress Notes (Signed)
Virtual Visit Consent   [facility], you are scheduled for a virtual visit with a [place] provider today.     Just as with appointments in the office, your consent must be obtained to participate.  Your consent will be active for this visit and any virtual visit you may have with one of our providers in the next 365 days.     If you have a MyChart account, a copy of this consent can be sent to you electronically.  All virtual visits are billed to your insurance company just like a traditional visit in the office.    As this is a virtual visit, video technology does not allow for your provider to perform a traditional examination.  This may limit your provider's ability to fully assess your condition.  If your provider identifies any concerns that need to be evaluated in person or the need to arrange testing (such as labs, EKG, etc.), we will make arrangements to do so.     Although advances in technology are sophisticated, we cannot ensure that it will always work on either your end or our end.  If the connection with a video visit is poor, the visit may have to be switched to a telephone visit.  With either a video or telephone visit, we are not always able to ensure that we have a secure connection.     I need to obtain your verbal consent now.   Are you willing to proceed with your visit today?    Raiah Cianci has provided verbal consent on 08/23/2020 for a virtual visit (video or telephone).   Apolonio Schneiders, FNP   Date: 08/23/2020 8:22 AM   Virtual Visit via Video Note   I, Apolonio Schneiders, connected with  Shenea Munson  ([id number], 1990/03/10) on 08/23/20 at  8:30 AM EDT by a video-enabled telemedicine application and verified that I am speaking with the correct person using two identifiers.  Location: Patient: Virtual Visit Location Patient: Home Provider: Virtual Visit Location Provider: Office/Clinic   I discussed the limitations of evaluation and management by telemedicine  and the availability of in person appointments. The patient expressed understanding and agreed to proceed.    History of Present Illness: Michelle Waller is a 30 y.o. who identifies as a female who was assigned female at birth, and is being seen today for left ear pain that she has had intermittently for the past month. She has popping at times, ringing at times, and radiating pain that goes to her left tonsil area. In the past week it has been waking her up in her sleep. This started about a month ago after URI symptoms without known COVID. She has resolved from sinus and cough symptoms now has only ear pain and PND.   She has had ear infections in the past, it has been a few years since she has had one.   She gets relief when laying on the ear.   She has been using Extra Strength tylenol for relief.    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   Nonallopathic lesion of thoracic region  04/30/2017   Nonallopathic lesion of rib cage 04/30/2017   Nonallopathic lesion of cervical region 04/30/2017   Chest pain 01/31/2017   ANA positive 10/31/2016   Anxiety 08/15/2016   Hypothyroidism 02/03/2016    Allergies:  Allergies  Allergen Reactions   Soy Allergy Anaphylaxis    Large amounts cause anaphylaxis Small amounts cause upset stomach   Methylprednisolone Hives and Swelling   Montelukast     Nightmares    Current Outpatient Medications  Medication Instructions   acetaminophen (TYLENOL) 500 mg, Oral, Every 6 hours PRN   amoxicillin-clavulanate (AUGMENTIN) 875-125 MG tablet  1 tablet, Oral, 2 times daily, Take with food   escitalopram (LEXAPRO) 5 mg, Oral, Every evening   fluconazole (DIFLUCAN) 150 mg, Oral,  Once   Iron 325 mg, Oral, Daily   levothyroxine (SYNTHROID) 50 mcg, Oral, Daily before breakfast   Multiple Vitamins-Minerals (ADULT GUMMY) CHEW 6 capsules, Oral, Daily   omeprazole (PRILOSEC) 40 mg, Oral, Daily at bedtime   Probiotic CHEW 2 capsules, Oral, Daily   VITAMIN D PO 1 capsule, Oral, Daily     Observations/Objective: Patient is well-developed, well-nourished in no acute distress.  Resting comfortably at home.  Head is normocephalic, atraumatic.  No labored breathing.  Speech is clear and coherent with logical content.  Patient is alert and oriented at baseline.    Assessment and Plan: If needed difulcan for vaginal yeast symptoms while taking antibiotic or after. May also use probiotic while on antibiotic.   - fluconazole (DIFLUCAN) 150 MG tablet; Take 1 tablet (150 mg total) by mouth once for 1 dose.  Dispense: 1 tablet; Refill: 0  2. Ear infection  - amoxicillin-clavulanate (AUGMENTIN) 875-125 MG tablet; Take 1 tablet by mouth 2 (two) times daily for 10 days. Take with food  Dispense: 20 tablet; Refill: 0     Follow Up Instructions: I discussed the assessment and treatment plan with the patient. The patient was provided an opportunity to ask questions and all were answered. The patient agreed with the plan and demonstrated an understanding of the instructions.  A copy of instructions were sent to the patient via MyChart.  The patient was advised to call back or seek an in-person evaluation if the symptoms worsen or if the condition fails to improve as anticipated.  Time:  I spent 15 minutes with the patient via telehealth technology discussing the above problems/concerns.    Apolonio Schneiders, FNP

## 2020-08-24 ENCOUNTER — Ambulatory Visit
Admission: RE | Admit: 2020-08-24 | Discharge: 2020-08-24 | Disposition: A | Discharge status: Home / Self Care | Payer: 59 | Source: Ambulatory Visit | Attending: Physician Assistant | Admitting: Physician Assistant

## 2020-08-24 ENCOUNTER — Other Ambulatory Visit: Payer: Self-pay

## 2020-08-24 VITALS — BP 133/84 | HR 84 | Temp 98.1°F | Resp 16

## 2020-08-24 DIAGNOSIS — J0140 Acute pansinusitis, unspecified: Secondary | ICD-10-CM

## 2020-08-24 MED ORDER — PREDNISONE 50 MG PO TABS: 50.0000 mg | ORAL_TABLET | Freq: Every day | ORAL | 0 refills | Status: DC

## 2020-08-24 MED ORDER — AZELASTINE HCL 0.1 % NA SOLN: 2.0000 | Freq: Two times a day (BID) | NASAL | 0 refills | Status: DC

## 2020-08-24 NOTE — ED Triage Notes (Signed)
Patient presents to Urgent Care with complaints of intermittent ear pain and sore throat x month. Pt states she had virtual visit yesterday with her pcp she was prescribed augmentin. She states ear pain increased last night so here for eval.   Denies fever.

## 2020-08-24 NOTE — ED Provider Notes (Signed)
Michelle Waller    CSN: [id number] Arrival date & time: 08/24/20  1020      History   Chief Complaint Chief Complaint  Patient presents with   Otalgia   Sore Throat   Appointment    1030    HPI Michelle Waller is a 30 y.o. female.   30 year old female comes in for intermittent URI symptoms for the past month including mild cough, post nasal drainage, rhinorrhea, nasal congestion, sore throat, lymphadenopathy. For the past 2 weeks, having ear pain, increased sinus pressure. Has ear fullness with tinnitus. Denies fever. Did virtual visit with PCP yesterday, started on augmentin, has taken 2 doses. However, had worsening ear pain last night and therefore came in for evaluation.    Past Medical History:  Diagnosis Date   Allergy    Anemia    Anxiety    Asthma    no inhalers   Eating disorder    Family history of adverse reaction to anesthesia    mom-n/v   Frequent headaches    Gastritis    GERD (gastroesophageal reflux disease)    Hiatal hernia    Hypothyroid    IBS (irritable bowel syndrome)    Kidney stones    Obsessive compulsive disorder    Ovarian cyst    Pregnancy induced hypertension    Sjogren's disease ([place])    Sjogren's syndrome ([facility])    Thyroid disease    UTI (urinary tract infection)    Vaginal Pap smear, abnormal    Vision abnormalities     Patient Active Problem List   Diagnosis Date Noted   Snoring 07/01/2020   Blood pressure alteration 03/05/2020   Seasonal and perennial allergic rhinitis 04/16/2019   Palpitations 01/15/2019   Nonallopathic lesion of lumbosacral region 10/02/2017   Nonallopathic lesion of sacral region 10/02/2017   Irritable bowel syndrome 06/21/2017   Routine general medical examination at a health care facility 06/14/2017   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   Nonallopathic lesion of thoracic region 04/30/2017   Nonallopathic lesion of rib cage 04/30/2017   Nonallopathic lesion of cervical region 04/30/2017   Chest  pain 01/31/2017   ANA positive 10/31/2016   Anxiety 08/15/2016   Hypothyroidism 02/03/2016    Past Surgical History:  Procedure Laterality Date   ADENOIDECTOMY     CERVIX LESION DESTRUCTION     COLONOSCOPY     x2   CYSTOSCOPY     ESOPHAGOGASTRODUODENOSCOPY     x3   KIDNEY STONE SURGERY     KNEE ARTHROSCOPY Right    KNEE SURGERY      OB History     Gravida  1   Para  1   Term  1   Preterm  0   AB  0   Living  1      SAB  0   IAB  0   Ectopic  0   Multiple  0   Live Births  1            Home Medications    Prior to Admission medications   Medication Sig Start Date End Date Taking? Authorizing Provider  azelastine (ASTELIN) 0.1 % nasal spray Place 2 sprays into both nostrils 2 (two) times daily. 08/24/20  Yes Johnta Couts V, PA-C  predniSONE (DELTASONE) 50 MG tablet Take 1 tablet (50 mg total) by mouth daily with breakfast. 08/24/20  Yes Lakin Rhine V, PA-C  acetaminophen (TYLENOL) 500 MG tablet Take 500 mg by mouth  every 6 (six) hours as needed for headache or moderate pain.    [provider]  amoxicillin-clavulanate (AUGMENTIN) 875-125 MG tablet Take 1 tablet by mouth 2 (two) times daily for 10 days. Take with food 08/23/20 09/02/20  Apolonio Schneiders, FNP  escitalopram (LEXAPRO) 5 MG tablet Take 5 mg by mouth every evening. 06/22/20   [provider]  Ferrous Sulfate (IRON) 325 (65 Fe) MG TABS Take 325 mg by mouth daily.    [provider]  levothyroxine (SYNTHROID) 50 MCG tablet Take 1 tablet (50 mcg total) by mouth daily before breakfast. 03/30/20   Hoyt Koch, MD  Multiple Vitamins-Minerals (ADULT GUMMY) CHEW Chew 6 capsules by mouth daily.    [provider]  omeprazole (PRILOSEC) 20 MG capsule Take 40 mg by mouth at bedtime.    [provider]  Probiotic CHEW Chew 2 capsules by mouth daily.    [provider]  VITAMIN D PO Take 1 capsule by mouth daily.    [provider]  albuterol (PROVENTIL  HFA;VENTOLIN HFA) 108 (90 Base) MCG/ACT inhaler Inhale 2 puffs into the lungs every 4 (four) hours as needed for wheezing or shortness of breath. 11/19/17 07/05/19  Lyndal Pulley, DO  Fluticasone Propionate Truett Perna) 93 MCG/ACT EXHU Place 32 sprays into the nose in the morning and at bedtime. 05/27/19 07/05/19  Valentina Shaggy, MD  sucralfate (CARAFATE) 1 g tablet Take 1 tablet (1 g total) by mouth 4 (four) times daily -  with meals and at bedtime. 06/20/19 07/05/19  Mauri Pole, MD    Family History Family History  Problem Relation Age of Onset   Thyroid disease Mother    Hypertension Father    Diabetes Father    Thyroid disease Sister    Cancer Paternal Grandmother        breast   Diabetes Paternal Grandfather    Heart disease Paternal Grandfather    Depression Mother    Atrial fibrillation Mother    Colon polyps Father    Arthritis Maternal Grandmother    Heart disease Maternal Grandmother    Hypertension Paternal Grandmother    Hypertension Paternal Grandfather    Diabetes Mellitus II Sister    Hypothyroidism Sister    Colon cancer Neg Hx    Esophageal cancer Neg Hx    Rectal cancer Neg Hx    Stomach cancer Neg Hx     Social History Social History   Tobacco Use   Smoking status: Former    Packs/day: 0.25    Years: 1.00    Pack years: 0.25    Types: Cigarettes    Quit date: 11/09/2011    Years since quitting: 8.7   Smokeless tobacco: Never  Vaping Use   Vaping Use: Never used  Substance Use Topics   Alcohol use: Not Currently    Comment: occasionally    Drug use: Never     Allergies   Soy allergy, Methylprednisolone, and Montelukast   Review of Systems Review of Systems  Reason unable to perform ROS: See HPI as above.    Physical Exam Triage Vital Signs ED Triage Vitals [08/24/20 1030]  Enc Vitals Group     BP 133/84     Pulse Rate 84     Resp 16     Temp 98.1 F (36.7 C)     Temp Source Oral     SpO2 98 %     Weight      Height  Head Circumference      Peak Flow      Pain Score      Pain Loc      Pain Edu?      Excl. in [place]?    No data found.  Updated Vital Signs BP 133/84 (BP Location: Left Arm)   Pulse 84   Temp 98.1 F (36.7 C) (Oral)   Resp 16   SpO2 98%   Physical Exam Constitutional:      General: She is not in acute distress.    Appearance: Normal appearance. She is well-developed. She is not ill-appearing, toxic-appearing or diaphoretic.  HENT:     Head: Normocephalic and atraumatic.     Right Ear: Ear canal and external ear normal. Tympanic membrane is erythematous. Tympanic membrane is not bulging.     Left Ear: Ear canal and external ear normal. A middle ear effusion is present. Tympanic membrane is erythematous. Tympanic membrane is not bulging.     Nose:     Right Sinus: No maxillary sinus tenderness or frontal sinus tenderness.     Left Sinus: No maxillary sinus tenderness or frontal sinus tenderness.     Mouth/Throat:     Mouth: Mucous membranes are moist.     Pharynx: Oropharynx is clear. Uvula midline. Posterior oropharyngeal erythema present.  Eyes:     Conjunctiva/sclera: Conjunctivae normal.     Pupils: Pupils are equal, round, and reactive to light.  Cardiovascular:     Rate and Rhythm: Normal rate and regular rhythm.  Pulmonary:     Effort: Pulmonary effort is normal. No accessory muscle usage, prolonged expiration, respiratory distress or retractions.     Breath sounds: No decreased air movement or transmitted upper airway sounds. No decreased breath sounds.     Comments: LCTAB Musculoskeletal:     Cervical back: Normal range of motion and neck supple.  Skin:    General: Skin is warm and dry.  Neurological:     Mental Status: She is alert and oriented to person, place, and time.     UC Treatments / Results  Labs (all labs ordered are listed, but only abnormal results are displayed) Labs Reviewed - No data to display  EKG   Radiology No results  found.  Procedures Procedures (including critical care time)  Medications Ordered in UC Medications - No data to display  Initial Impression / Assessment and Plan / UC Course  I have reviewed the triage vital signs and the nursing notes.  Pertinent labs & imaging results that were available during my care of the patient were reviewed by me and considered in my medical decision making (see chart for details).    Will have patient continue augmentin for sinusitis. Will add prednisone and nasal spray for mid ear effusion that is contributing to ear pain. Return precautions given.   Final Clinical Impressions(s) / UC Diagnoses   Final diagnoses:  Acute non-recurrent pansinusitis    ED Prescriptions     Medication Sig Dispense Auth. Provider   predniSONE (DELTASONE) 50 MG tablet Take 1 tablet (50 mg total) by mouth daily with breakfast. 5 tablet Onalee Steinbach V, PA-C   azelastine (ASTELIN) 0.1 % nasal spray Place 2 sprays into both nostrils 2 (two) times daily. 30 mL Ok Edwards, PA-C      PDMP not reviewed this encounter.   Ok Edwards, PA-C 08/24/20 1111

## 2020-08-24 NOTE — Discharge Instructions (Addendum)
Continue augmentin as directed Start prednisone as directed Xhance nasal spray, add azelastine if needed.   Follow up with PCP if symptoms not improving.

## 2020-09-09 ENCOUNTER — Other Ambulatory Visit: Payer: Self-pay

## 2020-09-09 ENCOUNTER — Ambulatory Visit (INDEPENDENT_AMBULATORY_CARE_PROVIDER_SITE_OTHER): Payer: 59 | Admitting: Gastroenterology

## 2020-09-09 ENCOUNTER — Encounter: Payer: Self-pay | Admitting: Gastroenterology

## 2020-09-09 VITALS — BP 117/77 | HR 96 | Temp 98.3°F | Ht 62.0 in | Wt 147.4 lb

## 2020-09-09 DIAGNOSIS — K641 Second degree hemorrhoids: Secondary | ICD-10-CM | POA: Diagnosis not present

## 2020-09-09 DIAGNOSIS — K5904 Chronic idiopathic constipation: Secondary | ICD-10-CM

## 2020-09-09 DIAGNOSIS — K602 Anal fissure, unspecified: Secondary | ICD-10-CM | POA: Diagnosis not present

## 2020-09-09 DIAGNOSIS — K219 Gastro-esophageal reflux disease without esophagitis: Secondary | ICD-10-CM

## 2020-09-09 NOTE — Progress Notes (Signed)
Cephas Darby, MD [address]  [place]  [place], [place] [address]  Main: [phone]  Fax: [phone]    Gastroenterology Consultation  Referring Provider:     Hoyt Koch, * Primary Care Physician:  Hoyt Koch, MD Primary Gastroenterologist:  Dr. Cephas Darby Reason for Consultation:   Symptomatic hemorrhoids, rectal pain, chronic GERD, chronic constipation        HPI:   Michelle Waller is a 30 y.o. female referred by Dr. Sharlet Salina, Real Cons, MD  for consultation & management of rectal pain started about 3 to 4 months ago.  Patient delivered in January 2022.  Since then, she has been experiencing intense sharp pain in her rectum with each bowel movement.  She does have history of chronic constipation, has worsened since her delivery.  Her stools are hard and lumpy.  She used to take Colace.  She does have a history of chronic heartburn, for which she is currently taking Pepcid.  Patient was taking Nexium during her pregnancy as it was worse.  Patient has been previously seen by Dr. Hilarie Fredrickson in [place].  She underwent upper endoscopy in May 2021, esophageal biopsies revealed reflux esophagitis, empirically dilated to 17 mm with savory.  She underwent colonoscopy in 2020 which was unremarkable.  She underwent upper endoscopy in 2019, gastric biopsies were normal. Patient's diet is devoid of fiber She does not smoke or drink alcohol She works as a Facilities manager She does have history of anxiety  Follow-up visit 09/09/2020 Patient is here for follow-up on her rectal pain.  She reports that her anal fissure has healed by itself.  She did not use nitroglycerin that was prescribed.  Her main concern today is rectal pressure, swelling in her rectum and protrusion of the hemorrhoid that spontaneously reduces after BM.  She also reports that her constipation has resolved.  Her acid reflux is also under control on omeprazole 40 mg daily.  NSAIDs:  None  Antiplts/Anticoagulants/Anti thrombotics: None  GI Procedures:  EGD 06/19/2019 Surgical [P], distal esophagus and proximal esophagus - ESOPHAGEAL SQUAMOUS MUCOSA WITH MILD VASCULAR CONGESTION, AND FOCAL SQUAMOUS BALLOONING, SUGGESTIVE OF REFLUX ESOPHAGITIS - NEGATIVE FOR INCREASED INTRAEPITHELIAL EOSINOPHILS - White specked mucosa in the esophagus. Biopsied. - 2 cm hiatal hernia. - No endoscopic esophageal abnormality such as stricture or ring to explain patient's dysphagia. Esophagus dilated with 17 mm Savary over a guidewire. - Normal stomach. - Normal examined duodenum.  Colonoscopy 03/19/2018 - The digital rectal exam was normal. - The terminal ileum appeared normal. - The entire examined colon appeared normal on direct and retroflexion views. - Biopsies for histology were taken with a cold forceps from the right colon, left colon and rectum for evaluation of microscopic colitis. Diagnosis 1. Surgical [P], right colon - COLONIC MUCOSA WITH NO SPECIFIC HISTOPATHOLOGIC CHANGES - NEGATIVE FOR ACUTE INFLAMMATION, FEATURES OF CHRONICITY, INCREASED INTRAEPITHELIAL LYMPHOCYTES OR THICKENED SUBEPITHELIAL COLLAGEN TABLE 2. Surgical [P], left colon - COLONIC MUCOSA WITH NO SPECIFIC HISTOPATHOLOGIC CHANGES - NEGATIVE FOR ACUTE INFLAMMATION, FEATURES OF CHRONICITY, INCREASED INTRAEPITHELIAL LYMPHOCYTES OR THICKENED SUBEPITHELIAL COLLAGEN TABLE  Upper endoscopy 02/01/2017 - Normal mucosa was found in the entire esophagus. - A 2 cm hiatal hernia was present. The GE junction is patulous. - Segmental mild inflammation characterized by erosions and erythema was found in the gastric body. Biopsies were taken with a cold forceps for histology. - The examined duodenum was normal. Surgical [P], gastric antrum and gastric body - GASTRIC ANTRAL AND OXYNTIC  MUCOSA WITH NO SPECIFIC HISTOPATHOLOGIC CHANGES. - WARTHIN-STARRY STAIN IS NEGATIVE FOR HELICOBACTER PYLORI.  Past Medical History:   Diagnosis Date   Allergy    Anemia    Anxiety    Asthma    no inhalers   Eating disorder    Family history of adverse reaction to anesthesia    mom-n/v   Frequent headaches    Gastritis    GERD (gastroesophageal reflux disease)    Hiatal hernia    Hypothyroid    IBS (irritable bowel syndrome)    Kidney stones    Obsessive compulsive disorder    Ovarian cyst    Pregnancy induced hypertension    Sjogren's disease (HCC)    Sjogren's syndrome (HCC)    Thyroid disease    UTI (urinary tract infection)    Vaginal Pap smear, abnormal    Vision abnormalities     Past Surgical History:  Procedure Laterality Date   ADENOIDECTOMY     CERVIX LESION DESTRUCTION     COLONOSCOPY     x2   CYSTOSCOPY     ESOPHAGOGASTRODUODENOSCOPY     x3   KIDNEY STONE SURGERY     KNEE ARTHROSCOPY Right    KNEE SURGERY       Current Outpatient Medications:    escitalopram (LEXAPRO) 5 MG tablet, Take 5 mg by mouth every evening., Disp: , Rfl:    levothyroxine (SYNTHROID) 50 MCG tablet, Take 1 tablet (50 mcg total) by mouth daily before breakfast., Disp: 90 tablet, Rfl: 3   Multiple Vitamins-Minerals (ADULT GUMMY) CHEW, Chew 6 capsules by mouth daily., Disp: , Rfl:    omeprazole (PRILOSEC) 20 MG capsule, Take 40 mg by mouth at bedtime., Disp: , Rfl:    Probiotic CHEW, Chew 2 capsules by mouth daily., Disp: , Rfl:    Family History  Problem Relation Age of Onset   Thyroid disease Mother    Hypertension Father    Diabetes Father    Thyroid disease Sister    Cancer Paternal Grandmother        breast   Diabetes Paternal Grandfather    Heart disease Paternal Grandfather    Depression Mother    Atrial fibrillation Mother    Colon polyps Father    Arthritis Maternal Grandmother    Heart disease Maternal Grandmother    Hypertension Paternal Grandmother    Hypertension Paternal Grandfather    Diabetes Mellitus II Sister    Hypothyroidism Sister    Colon cancer Neg Hx    Esophageal cancer  Neg Hx    Rectal cancer Neg Hx    Stomach cancer Neg Hx      Social History   Tobacco Use   Smoking status: Former    Packs/day: 0.25    Years: 1.00    Pack years: 0.25    Types: Cigarettes    Quit date: 11/09/2011    Years since quitting: 8.8   Smokeless tobacco: Never  Vaping Use   Vaping Use: Never used  Substance Use Topics   Alcohol use: Not Currently    Comment: occasionally    Drug use: Never    Allergies as of 09/09/2020 - Review Complete 09/09/2020  Allergen Reaction Noted   Soy allergy Anaphylaxis 09/23/2013   Methylprednisolone Hives and Swelling 11/27/2019   Montelukast  02/27/2020    Review of Systems:    All systems reviewed and negative except where noted in HPI.   Physical Exam:  BP 117/77 (BP Location: Left Arm, Patient Position:  Sitting, Cuff Size: Normal)   Pulse 96   Temp 98.3 F (36.8 C) (Oral)   Ht '5\' 2"'$  (1.575 m)   Wt 147 lb 6 oz (66.8 kg)   BMI 26.96 kg/m  No LMP recorded.  General:   Alert,  Well-developed, well-nourished, pleasant and cooperative in NAD Head:  Normocephalic and atraumatic. Eyes:  Sclera clear, no icterus.   Conjunctiva pink. Ears:  Normal auditory acuity. Nose:  No deformity, discharge, or lesions. Mouth:  No deformity or lesions,oropharynx pink & moist. Neck:  Supple; no masses or thyromegaly. Lungs:  Respirations even and unlabored.  Clear throughout to auscultation.   No wheezes, crackles, or rhonchi. No acute distress. Heart:  Regular rate and rhythm; no murmurs, clicks, rubs, or gallops. Abdomen:  Normal bowel sounds. Soft, non-tender and non-distended without masses, hepatosplenomegaly or hernias noted.  No guarding or rebound tenderness.   Rectal: Normal perianal exam, nontender digital rectal exam, palpable external hemorrhoids Msk:  Symmetrical without gross deformities. Good, equal movement & strength bilaterally. Pulses:  Normal pulses noted. Extremities:  No clubbing or edema.  No cyanosis. Neurologic:   Alert and oriented x3;  grossly normal neurologically. Skin:  Intact without significant lesions or rashes. No jaundice. Psych:  Alert and cooperative. Normal mood and affect.  Imaging Studies: Reviewed  Assessment and Plan:   Michelle Waller is a 30 y.o. pleasant female with history of chronic rectal pain, chronic constipation, chronic GERD.  The symptoms have currently healed.  Patient currently has grade 2 symptomatic external hemorrhoids  Grade 2 external hemorrhoids Discussed with patient regarding the outpatient hemorrhoid ligation, including the procedure, risks and benefits Patient is willing to undergo banding today, consent obtained   Follow up in 2 weeks   Cephas Darby, MD

## 2020-09-09 NOTE — Progress Notes (Signed)

## 2020-09-10 ENCOUNTER — Telehealth: Payer: Self-pay | Admitting: Gastroenterology

## 2020-09-10 MED ORDER — ONDANSETRON HCL 4 MG PO TABS: 4.0000 mg | ORAL_TABLET | Freq: Three times a day (TID) | ORAL | 0 refills | Status: DC | PRN

## 2020-09-10 NOTE — Telephone Encounter (Signed)
Called and left a message for call back  

## 2020-09-10 NOTE — Telephone Encounter (Signed)
Patient Michelle Waller and is having post banding problems.

## 2020-09-10 NOTE — Telephone Encounter (Signed)
States last night 1 in the morning woke up in bad pain, abdominal cramping, and nausea. Patient states tylenol is not helping with the pain. Dr. Marius Ditch states she can come to our office this morning and we can take the band out since she is unable to tolerate the procedure.

## 2020-09-10 NOTE — Telephone Encounter (Signed)
Patient came in the office today. She did not take out the band or adjust the band. Dr. Marius Ditch wants to prescribed 7 pills of Zofran '4mg'$  and to cancel the follow up appointment.

## 2020-09-14 ENCOUNTER — Encounter: Payer: Self-pay | Admitting: Gastroenterology

## 2020-09-23 ENCOUNTER — Ambulatory Visit: Payer: 59 | Admitting: Gastroenterology

## 2020-09-25 ENCOUNTER — Emergency Department
Admission: EM | Admit: 2020-09-25 | Discharge: 2020-09-25 | Disposition: A | Discharge status: Home / Self Care | Payer: 59 | Attending: Emergency Medicine | Admitting: Emergency Medicine

## 2020-09-25 ENCOUNTER — Emergency Department: Payer: 59

## 2020-09-25 ENCOUNTER — Other Ambulatory Visit: Payer: Self-pay

## 2020-09-25 ENCOUNTER — Encounter: Payer: Self-pay | Admitting: Emergency Medicine

## 2020-09-25 DIAGNOSIS — E039 Hypothyroidism, unspecified: Secondary | ICD-10-CM | POA: Insufficient documentation

## 2020-09-25 DIAGNOSIS — J45909 Unspecified asthma, uncomplicated: Secondary | ICD-10-CM | POA: Diagnosis not present

## 2020-09-25 DIAGNOSIS — Z7951 Long term (current) use of inhaled steroids: Secondary | ICD-10-CM | POA: Insufficient documentation

## 2020-09-25 DIAGNOSIS — Z79899 Other long term (current) drug therapy: Secondary | ICD-10-CM | POA: Diagnosis not present

## 2020-09-25 DIAGNOSIS — N200 Calculus of kidney: Secondary | ICD-10-CM | POA: Diagnosis not present

## 2020-09-25 DIAGNOSIS — Z87891 Personal history of nicotine dependence: Secondary | ICD-10-CM | POA: Diagnosis not present

## 2020-09-25 DIAGNOSIS — R1031 Right lower quadrant pain: Secondary | ICD-10-CM | POA: Diagnosis present

## 2020-09-25 LAB — COMPREHENSIVE METABOLIC PANEL
ALT: 17 U/L (ref 0–44)
AST: 20 U/L (ref 15–41)
Albumin: 4.3 g/dL (ref 3.5–5.0)
Alkaline Phosphatase: 56 U/L (ref 38–126)
Anion gap: 7 (ref 5–15)
BUN: 11 mg/dL (ref 6–20)
CO2: 27 mmol/L (ref 22–32)
Calcium: 8.9 mg/dL (ref 8.9–10.3)
Chloride: 107 mmol/L (ref 98–111)
Creatinine, Ser: 0.72 mg/dL (ref 0.44–1.00)
GFR, Estimated: >60 mL/min (ref >60)
Glucose, Bld: 120 mg/dL — ABNORMAL HIGH (ref 70–99)
Potassium: 3.8 mmol/L (ref 3.5–5.1)
Sodium: 141 mmol/L (ref 135–145)
Total Bilirubin: 0.5 mg/dL (ref 0.3–1.2)
Total Protein: 7.6 g/dL (ref 6.5–8.1)

## 2020-09-25 LAB — CBC WITH DIFFERENTIAL/PLATELET
Abs Immature Granulocytes: 0.01 10*3/uL (ref 0.00–0.07)
Basophils Absolute: 0 10*3/uL (ref 0.0–0.1)
Basophils Relative: 0 %
Eosinophils Absolute: 0.1 10*3/uL (ref 0.0–0.5)
Eosinophils Relative: 1 %
HCT: 39.2 % (ref 36.0–46.0)
Hemoglobin: 13.5 g/dL (ref 12.0–15.0)
Immature Granulocytes: 0 %
Lymphocytes Relative: 30 %
Lymphs Abs: 1.5 10*3/uL (ref 0.7–4.0)
MCH: 31.3 pg (ref 26.0–34.0)
MCHC: 34.4 g/dL (ref 30.0–36.0)
MCV: 90.7 fL (ref 80.0–100.0)
Monocytes Absolute: 0.3 10*3/uL (ref 0.1–1.0)
Monocytes Relative: 7 %
Neutro Abs: 3.1 10*3/uL (ref 1.7–7.7)
Neutrophils Relative %: 62 %
Platelets: 209 10*3/uL (ref 150–400)
RBC: 4.32 MIL/uL (ref 3.87–5.11)
RDW: 11.5 % (ref 11.5–15.5)
WBC: 5 10*3/uL (ref 4.0–10.5)
nRBC: 0 % (ref 0.0–0.2)

## 2020-09-25 LAB — URINALYSIS, COMPLETE (UACMP) WITH MICROSCOPIC
Bacteria, UA: NONE SEEN
Bilirubin Urine: NEGATIVE
Glucose, UA: NEGATIVE mg/dL
Ketones, ur: NEGATIVE mg/dL
Nitrite: 0.2 — AB
RBC / HPF: >50 RBC/hpf — ABNORMAL HIGH (ref 0–5)
Specific Gravity, Urine: 1.02 (ref 1.005–1.030)
pH: 7 (ref 5.0–8.0)

## 2020-09-25 LAB — PREGNANCY, URINE: Preg Test, Ur: NEGATIVE

## 2020-09-25 LAB — LIPASE, BLOOD: Lipase: 47 U/L (ref 11–51)

## 2020-09-25 MED ORDER — ONDANSETRON HCL 4 MG/2ML IJ SOLN
4.0000 mg | Freq: Once | INTRAMUSCULAR | Status: AC
  Administered 2020-09-25: 4 mg via INTRAVENOUS
  Filled 2020-09-25: qty 2

## 2020-09-25 MED ORDER — LACTATED RINGERS IV BOLUS
1000.0000 mL | Freq: Once | INTRAVENOUS | Status: AC
  Administered 2020-09-25: 1000 mL via INTRAVENOUS

## 2020-09-25 MED ORDER — IOHEXOL 350 MG/ML SOLN
75.0000 mL | Freq: Once | INTRAVENOUS | Status: AC | PRN
  Administered 2020-09-25: 75 mL via INTRAVENOUS

## 2020-09-25 MED ORDER — MORPHINE SULFATE (PF) 4 MG/ML IV SOLN
4.0000 mg | Freq: Once | INTRAVENOUS | Status: AC
  Administered 2020-09-25: 4 mg via INTRAVENOUS
  Filled 2020-09-25: qty 1

## 2020-09-25 NOTE — ED Notes (Signed)
Pt reports labs and UA have already been collected

## 2020-09-25 NOTE — ED Triage Notes (Signed)
Pt to ED via POV for Rt sided abdominal pain that started this morning around 0700. Pt states that she has hx/o Kidney stones and has oxycodone for the pain. Pt took oxycodone this morning around 0710 without relief. Pt reports vomiting x 3. Pt denies other symptoms. Pt is in NAD.

## 2020-09-25 NOTE — ED Provider Notes (Signed)
[facility] Emergency Department Provider Note   ____________________________________________   Event Date/Time   First MD Initiated Contact with Patient 09/25/20 [phone]     (approximate)  I have reviewed the triage vital signs and the nursing notes.   HISTORY  Chief Complaint Abdominal Pain    HPI Michelle Waller is a 30 y.o. female with past medical history of GERD, kidney stones, and hypothyroidism who presents to the ED complaining of abdominal pain.  Patient reports that last night she initially developed pain in her right flank.  Pain has been sharp and intermittent since then, not exacerbated or alleviated by anything.  It has since seem to localize to her periumbilical area as well as the right lower quadrant of her abdomen.  She states the initial flank pain felt similar to prior kidney stones, but now with localization to her abdomen it has started to feel different.  She denies any dysuria, hematuria, vaginal bleeding, or discharge.  She has felt nauseous with multiple episodes of vomiting, denies any diarrhea.  Her LMP was approximately 2 weeks ago.        Past Medical History:  Diagnosis Date   Allergy    Anemia    Anxiety    Asthma    no inhalers   Eating disorder    Family history of adverse reaction to anesthesia    mom-n/v   Frequent headaches    Gastritis    GERD (gastroesophageal reflux disease)    Hiatal hernia    Hypothyroid    IBS (irritable bowel syndrome)    Kidney stones    Obsessive compulsive disorder    Ovarian cyst    Pregnancy induced hypertension    Sjogren's disease ([place])    Sjogren's syndrome (HCC)    Thyroid disease    UTI (urinary tract infection)    Vaginal Pap smear, abnormal    Vision abnormalities     Patient Active Problem List   Diagnosis Date Noted   Snoring 07/01/2020   Blood pressure alteration 03/05/2020   Seasonal and perennial allergic rhinitis 04/16/2019   Palpitations 01/15/2019    Nonallopathic lesion of lumbosacral region 10/02/2017   Nonallopathic lesion of sacral region 10/02/2017   Irritable bowel syndrome 06/21/2017   Routine general medical examination at a health care facility 06/14/2017   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 Authorizing Provider  escitalopram (LEXAPRO) 5 MG tablet Take 5 mg by mouth every evening. 06/22/20   [provider]  levothyroxine (SYNTHROID) 50 MCG tablet Take 1 tablet (50 mcg total) by mouth daily before breakfast. 03/30/20   Hoyt Koch, MD  Multiple Vitamins-Minerals (ADULT GUMMY) CHEW Chew 6 capsules by mouth daily.    [provider]  omeprazole (PRILOSEC) 20 MG capsule Take 40 mg by mouth at bedtime.  [provider]  ondansetron (ZOFRAN) 4 MG tablet Take 1 tablet (4 mg total) by mouth every 8 (eight) hours as needed for nausea or vomiting. 09/10/20   Lin Landsman, MD  Probiotic CHEW Chew 2 capsules by mouth daily.    [provider]  albuterol (PROVENTIL HFA;VENTOLIN HFA) 108 (90 Base) MCG/ACT inhaler Inhale 2 puffs into the lungs every 4 (four) hours as needed for wheezing or shortness of breath. 11/19/17 07/05/19  Lyndal Pulley, DO  Fluticasone Propionate Truett Perna) 93 MCG/ACT EXHU Place 32 sprays into the nose in the morning and at bedtime. 05/27/19 07/05/19  Valentina Shaggy, MD  sucralfate (CARAFATE) 1  g tablet Take 1 tablet (1 g total) by mouth 4 (four) times daily -  with meals and at bedtime. 06/20/19 07/05/19  Mauri Pole, MD    Allergies Soy allergy, Methylprednisolone, and Montelukast  Family History  Problem Relation Age of Onset   Thyroid disease Mother    Hypertension Father    Diabetes Father    Thyroid disease Sister    Cancer Paternal Grandmother        breast   Diabetes Paternal Grandfather    Heart disease Paternal Grandfather    Depression Mother    Atrial fibrillation Mother    Colon polyps Father    Arthritis Maternal Grandmother    Heart disease Maternal Grandmother    Hypertension Paternal Grandmother    Hypertension Paternal Grandfather    Diabetes Mellitus II Sister    Hypothyroidism Sister    Colon cancer Neg Hx    Esophageal cancer Neg Hx    Rectal cancer Neg Hx    Stomach cancer Neg Hx     Social History Social History   Tobacco Use   Smoking status: Former    Packs/day: 0.25    Years: 1.00    Pack years: 0.25    Types: Cigarettes    Quit date: 11/09/2011    Years since quitting: 8.8   Smokeless tobacco: Never  Vaping Use   Vaping Use: Never used  Substance Use Topics   Alcohol use: Not Currently    Comment: occasionally    Drug use: Never    Review of Systems  Constitutional: No fever/chills Eyes: No visual changes. ENT: No sore throat. Cardiovascular: Denies chest pain. Respiratory: Denies shortness of breath. Gastrointestinal: Positive for flank and abdominal pain.  Positive for nausea and vomiting.  No diarrhea.  No constipation. Genitourinary: Negative for dysuria. Musculoskeletal: Negative for back pain. Skin: Negative for rash. Neurological: Negative for headaches, focal weakness or numbness.  ____________________________________________   PHYSICAL EXAM:  VITAL SIGNS: ED Triage Vitals  Enc Vitals Group     BP 09/25/20 0853 (!) 132/97     Pulse Rate 09/25/20 0853 70     Resp 09/25/20 0853 20     Temp  09/25/20 0853 98.3 F (36.8 C)     Temp Source 09/25/20 0853 Oral     SpO2 09/25/20 0853 100 %     Weight 09/25/20 0853 150 lb (68 kg)     Height 09/25/20 0853 '5\' 2"'$  (1.575 m)     Head Circumference --      Peak Flow --      Pain Score 09/25/20 0903 8     Pain Loc --      Pain Edu? --      Excl. in [place]? --     Constitutional: Alert and oriented. Eyes: Conjunctivae are normal. Head: Atraumatic.  Nose: No congestion/rhinnorhea. Mouth/Throat: Mucous membranes are moist. Neck: Normal ROM Cardiovascular: Normal rate, regular rhythm. Grossly normal heart sounds. Respiratory: Normal respiratory effort.  No retractions. Lungs CTAB. Gastrointestinal: Soft and tender to palpation in the right lower quadrant with no rebound or guarding.  No CVA tenderness bilaterally. No distention. Genitourinary: deferred Musculoskeletal: No lower extremity tenderness nor edema. Neurologic:  Normal speech and language. No gross focal neurologic deficits are appreciated. Skin:  Skin is warm, dry and intact. No rash noted. Psychiatric: Mood and affect are normal. Speech and behavior are normal.  ____________________________________________   LABS (all labs ordered are listed, but only abnormal results are displayed)  Labs Reviewed  URINALYSIS, COMPLETE (UACMP) WITH MICROSCOPIC - Abnormal; Notable for the following components:      Result Value   APPearance HAZY (*)    Hgb urine dipstick LARGE (*)    Protein, ur TRACE (*)    Nitrite 0.2 (*)    Leukocytes,Ua TRACE (*)    RBC / HPF >50 (*)    All other components within normal limits  COMPREHENSIVE METABOLIC PANEL - Abnormal; Notable for the following components:   Glucose, Bld 120 (*)    All other components within normal limits  CBC WITH DIFFERENTIAL/PLATELET  LIPASE, BLOOD  PREGNANCY, URINE     PROCEDURES  Procedure(s) performed (including Critical Care):  Procedures   ____________________________________________   INITIAL IMPRESSION  / ASSESSMENT AND PLAN / ED COURSE      30 year old female with past medical history of GERD, hypothyroidism, and kidney stones who presents to the ED with flank and abdominal pain developing last night and worsening this morning.  She now states that pain seems to have localized to her abdomen and she has tenderness in the right lower quadrant.  We will further assess with CT scan for appendicitis versus recurrent kidney stones.  UA and labs are pending, we will treat symptomatically with IV morphine and Zofran, hydrate with IV fluids.  CT scan is negative for appendicitis, does show 2 obstructing ureteral stones on the right which explains patient's pain.  UA is a somewhat contaminated sample but does not appear consistent with infection and patient denies any symptoms consistent with urinary infection.  Labs are unremarkable and on reassessment, patient states she is feeling much better with minimal pain.  She is appropriate for discharge home with urology follow-up, states she has pain and nausea medication available at home from prior kidney stones.  She was counseled to return to the ED for new worsening symptoms, patient agrees with plan.      ____________________________________________   FINAL CLINICAL IMPRESSION(S) / ED DIAGNOSES  Final diagnoses:  Kidney stone on right side     ED Discharge Orders     None        Note:  This document was prepared using Dragon voice recognition software and may include unintentional dictation errors.    Blake Divine, MD 09/25/20 [phone]

## 2020-09-25 NOTE — ED Notes (Signed)
Patient transported to CT 

## 2020-09-28 ENCOUNTER — Ambulatory Visit (INDEPENDENT_AMBULATORY_CARE_PROVIDER_SITE_OTHER): Payer: 59 | Admitting: Urology

## 2020-09-28 ENCOUNTER — Other Ambulatory Visit: Payer: Self-pay

## 2020-09-28 ENCOUNTER — Encounter: Payer: Self-pay | Admitting: Urology

## 2020-09-28 VITALS — BP 122/85 | HR 79 | Ht 62.0 in | Wt 151.0 lb

## 2020-09-28 DIAGNOSIS — N200 Calculus of kidney: Secondary | ICD-10-CM

## 2020-09-28 MED ORDER — TAMSULOSIN HCL 0.4 MG PO CAPS: 0.4000 mg | ORAL_CAPSULE | Freq: Every day | ORAL | 0 refills | Status: DC

## 2020-09-28 NOTE — Progress Notes (Signed)
   09/28/2020 3:13 PM   [name] 1990/04/06 [id number]  Reason for visit: Nephrolithiasis  HPI: 30 year old female with recurrent stone disease and greater than 5 stone events, including one that required ureteroscopy in [place] who presented to the ED on 09/25/2020 with nausea vomiting, and right-sided flank pain.  I personally viewed and interpreted the CT that shows a 5 mm right distal ureteral stone as well as a 5 mm right proximal ureteral stone with upstream hydronephrosis.  Labs were benign and she was discharged with medical expulsive therapy.  She is overall felt well since that time and her nausea and pain are much better controlled.  She has had 24-hour urine test in the past with [facility], but those records were not available to me.  Calcium was normal on multiple BMPs.  We discussed various treatment options for urolithiasis including observation with or without medical expulsive therapy, shockwave lithotripsy (SWL), ureteroscopy and laser lithotripsy with stent placement, and percutaneous nephrolithotomy.  We discussed that management is based on stone size, location, density, patient co-morbidities, and patient preference.   Stones <68m in size have a >80% spontaneous passage rate. Data surrounding the use of tamsulosin for medical expulsive therapy is controversial, but meta analyses suggests it is most efficacious for distal stones between 5-140min size. Possible side effects include dizziness/lightheadedness, and retrograde ejaculation.  SWL has a lower stone free rate in a single procedure, but also a lower complication rate compared to ureteroscopy and avoids a stent and associated stent related symptoms. Possible complications include renal hematoma, steinstrasse, and need for additional treatment.  Shockwave not a good option for her with 2 different stones at different locations in the ureter.  Ureteroscopy with laser lithotripsy and stent placement has a  higher stone free rate than SWL in a single procedure, however increased complication rate including possible infection, ureteral injury, bleeding, and stent related morbidity. Common stent related symptoms include dysuria, urgency/frequency, and flank pain.  She would like to continue with medical expulsive therapy.  Risks and benefits discussed at length.  If she is not doing well, I asked her to call to consider being added for ureteroscopy later in the week if needed, and will have close follow-up in 2 weeks with UA and KUB to confirm stone passage  BrBilley CoMD  [address]est Townu[place]ettleton[place] [phone]

## 2020-09-28 NOTE — Patient Instructions (Signed)
Dietary Guidelines to Help Prevent Kidney Stones Kidney stones are deposits of minerals and salts that form inside your kidneys. Your risk of developing kidney stones may be greater depending on your diet, your lifestyle, the medicines you take, and whether you have certain medical conditions. Most people can lower their chances of developing kidney stones by following the instructions below. Your dietitian may give you more specific instructions depending on your overall health and the type of kidney stones you tend to develop. What are tips for following this plan? Reading food labels  Choose foods with "no salt added" or "low-salt" labels. Limit your salt (sodium) intake to less than 1,500 mg a day. Choose foods with calcium for each meal and snack. Try to eat about 300 mg of calcium at each meal. Foods that contain 200-500 mg of calcium a serving include: 8 oz (237 mL) of milk, calcium-fortifiednon-dairy milk, and calcium-fortifiedfruit juice. Calcium-fortified means that calcium has been added to these drinks. 8 oz (237 mL) of kefir, yogurt, and soy yogurt. 4 oz (114 g) of tofu. 1 oz (28 g) of cheese. 1 cup (150 g) of dried figs. 1 cup (91 g) of cooked broccoli. One 3 oz (85 g) can of sardines or mackerel. Most people need 1,000-1,500 mg of calcium a day. Talk to your dietitian about how much calcium is recommended for you. Shopping Buy plenty of fresh fruits and vegetables. Most people do not need to avoid fruits and vegetables, even if these foods contain nutrients that may contribute to kidney stones. When shopping for convenience foods, choose: Whole pieces of fruit. Pre-made salads with dressing on the side. Low-fat fruit and yogurt smoothies. Avoid buying frozen meals or prepared deli foods. These can be high in sodium. Look for foods with live cultures, such as yogurt and kefir. Choose high-fiber grains, such as whole-wheat breads, oat bran, and wheat cereals. Cooking Do not add  salt to food when cooking. Place a salt shaker on the table and allow each person to add his or her own salt to taste. Use vegetable protein, such as beans, textured vegetable protein (TVP), or tofu, instead of meat in pasta, casseroles, and soups. Meal planning Eat less salt, if told by your dietitian. To do this: Avoid eating processed or pre-made food. Avoid eating fast food. Eat less animal protein, including cheese, meat, poultry, or fish, if told by your dietitian. To do this: Limit the number of times you have meat, poultry, fish, or cheese each week. Eat a diet free of meat at least 2 days a week. Eat only one serving each day of meat, poultry, fish, or seafood. When you prepare animal protein, cut pieces into small portion sizes. For most meat and fish, one serving is about the size of the palm of your hand. Eat at least five servings of fresh fruits and vegetables each day. To do this: Keep fruits and vegetables on hand for snacks. Eat one piece of fruit or a handful of berries with breakfast. Have a salad and fruit at lunch. Have two kinds of vegetables at dinner. Limit foods that are high in a substance called oxalate. These include: Spinach (cooked), rhubarb, beets, sweet potatoes, and Swiss chard. Peanuts. Potato chips, french fries, and baked potatoes with skin on. Nuts and nut products. Chocolate. If you regularly take a diuretic medicine, make sure to eat at least 1 or 2 servings of fruits or vegetables that are high in potassium each day. These include: Avocado. Banana. Orange, prune,   carrot, or tomato juice. Baked potato. Cabbage. Beans and split peas. Lifestyle  Drink enough fluid to keep your urine pale yellow. This is the most important thing you can do. Spread your fluid intake throughout the day. If you drink alcohol: Limit how much you use to: 0-1 drink a day for women who are not pregnant. 0-2 drinks a day for men. Be aware of how much alcohol is in your  drink. In the [place] one drink equals one 12 oz bottle of beer (355 mL), one 5 oz glass of wine (148 mL), or one 1 oz glass of hard liquor (44 mL). Lose weight if told by your health care provider. Work with your dietitian to find an eating plan and weight loss strategies that work best for you. General information Talk to your health care provider and dietitian about taking daily supplements. You may be told the following depending on your health and the cause of your kidney stones: Not to take supplements with vitamin C. To take a calcium supplement. To take a daily probiotic supplement. To take other supplements such as magnesium, fish oil, or vitamin B6. Take over-the-counter and prescription medicines only as told by your health care provider. These include supplements. What foods should I limit? Limit your intake of the following foods, or eat them as told by your dietitian. Vegetables Spinach. Rhubarb. Beets. Canned vegetables. Pickles. Olives. Baked potatoes with skin. Grains Wheat bran. Baked goods. Salted crackers. Cereals high in sugar. Meats and other proteins Nuts. Nut butters. Large portions of meat, poultry, or fish. Salted, precooked, or cured meats, such as sausages, meat loaves, and hot dogs. Dairy Cheese. Beverages Regular soft drinks. Regular vegetable juice. Seasonings and condiments Seasoning blends with salt. Salad dressings. Soy sauce. Ketchup. Barbecue sauce. Other foods Canned soups. Canned pasta sauce. Casseroles. Pizza. Lasagna. Frozen meals. Potato chips. French fries. The items listed above may not be a complete list of foods and beverages you should limit. Contact a dietitian for more information. What foods should I avoid? Talk to your dietitian about specific foods you should avoid based on the type of kidney stones you have and your overall health. Fruits Grapefruit. The item listed above may not be a complete list of foods and beverages you should  avoid. Contact a dietitian for more information. Summary Kidney stones are deposits of minerals and salts that form inside your kidneys. You can lower your risk of kidney stones by making changes to your diet. The most important thing you can do is drink enough fluid. Drink enough fluid to keep your urine pale yellow. Talk to your dietitian about how much calcium you should have each day, and eat less salt and animal protein as told by your dietitian. This information is not intended to replace advice given to you by your health care provider. Make sure you discuss any questions you have with your health care provider. Document Revised: 01/02/2019 Document Reviewed: 01/02/2019 Elsevier Patient Education  2022 Elsevier Inc.  

## 2020-10-06 ENCOUNTER — Encounter: Payer: Self-pay | Admitting: Internal Medicine

## 2020-10-07 MED ORDER — ESCITALOPRAM OXALATE 5 MG PO TABS: 5.0000 mg | ORAL_TABLET | Freq: Every evening | ORAL | 3 refills | Status: AC

## 2020-10-15 ENCOUNTER — Encounter: Payer: Self-pay | Admitting: Internal Medicine

## 2020-10-16 ENCOUNTER — Other Ambulatory Visit: Payer: Self-pay

## 2020-10-16 ENCOUNTER — Emergency Department: Payer: 59

## 2020-10-16 DIAGNOSIS — Z87891 Personal history of nicotine dependence: Secondary | ICD-10-CM | POA: Insufficient documentation

## 2020-10-16 DIAGNOSIS — E039 Hypothyroidism, unspecified: Secondary | ICD-10-CM | POA: Diagnosis not present

## 2020-10-16 DIAGNOSIS — R002 Palpitations: Secondary | ICD-10-CM | POA: Insufficient documentation

## 2020-10-16 DIAGNOSIS — R079 Chest pain, unspecified: Secondary | ICD-10-CM | POA: Diagnosis present

## 2020-10-16 DIAGNOSIS — R0789 Other chest pain: Secondary | ICD-10-CM | POA: Diagnosis not present

## 2020-10-16 DIAGNOSIS — J45909 Unspecified asthma, uncomplicated: Secondary | ICD-10-CM | POA: Insufficient documentation

## 2020-10-16 DIAGNOSIS — Z79899 Other long term (current) drug therapy: Secondary | ICD-10-CM | POA: Diagnosis not present

## 2020-10-16 LAB — CBC
HCT: 35.3 % — ABNORMAL LOW (ref 36.0–46.0)
Hemoglobin: 12.3 g/dL (ref 12.0–15.0)
MCH: 31 pg (ref 26.0–34.0)
MCHC: 34.8 g/dL (ref 30.0–36.0)
MCV: 88.9 fL (ref 80.0–100.0)
Platelets: 160 10*3/uL (ref 150–400)
RBC: 3.97 MIL/uL (ref 3.87–5.11)
RDW: 11.5 % (ref 11.5–15.5)
WBC: 4.1 10*3/uL (ref 4.0–10.5)
nRBC: 0 % (ref 0.0–0.2)

## 2020-10-16 LAB — BASIC METABOLIC PANEL
Anion gap: 8 (ref 5–15)
BUN: 15 mg/dL (ref 6–20)
CO2: 25 mmol/L (ref 22–32)
Calcium: 8.7 mg/dL — ABNORMAL LOW (ref 8.9–10.3)
Chloride: 103 mmol/L (ref 98–111)
Creatinine, Ser: 0.87 mg/dL (ref 0.44–1.00)
GFR, Estimated: >60 mL/min (ref >60)
Glucose, Bld: 93 mg/dL (ref 70–99)
Potassium: 3.6 mmol/L (ref 3.5–5.1)
Sodium: 136 mmol/L (ref 135–145)

## 2020-10-16 LAB — TROPONIN I (HIGH SENSITIVITY): Troponin I (High Sensitivity): <2 ng/L (ref <18)

## 2020-10-16 LAB — CK: Total CK: 128 U/L (ref 38–234)

## 2020-10-16 NOTE — ED Triage Notes (Signed)
Pt presents to ER c/o left sided neck pain and chest pain x3 days.  Pt states pain began after a cardio workout.  Pt denies hx of any heart issues but does take synthroid.  Pt states she does have some associated shortness of breath.  Pt ambulatory and A&O x4.

## 2020-10-17 ENCOUNTER — Emergency Department
Admission: EM | Admit: 2020-10-17 | Discharge: 2020-10-17 | Disposition: A | Discharge status: Home / Self Care | Payer: 59 | Attending: Emergency Medicine | Admitting: Emergency Medicine

## 2020-10-17 ENCOUNTER — Emergency Department: Payer: 59

## 2020-10-17 DIAGNOSIS — R079 Chest pain, unspecified: Secondary | ICD-10-CM

## 2020-10-17 LAB — D-DIMER, QUANTITATIVE: D-Dimer, Quant: 0.79 ug/mL-FEU — ABNORMAL HIGH (ref 0.00–0.50)

## 2020-10-17 LAB — TSH: TSH: 2.655 u[IU]/mL (ref 0.350–4.500)

## 2020-10-17 LAB — POC URINE PREG, ED: Preg Test, Ur: NEGATIVE

## 2020-10-17 LAB — T4, FREE: Free T4: 0.71 ng/dL (ref 0.61–1.12)

## 2020-10-17 LAB — TROPONIN I (HIGH SENSITIVITY): Troponin I (High Sensitivity): <2 ng/L (ref <18)

## 2020-10-17 MED ORDER — IOHEXOL 350 MG/ML SOLN
68.0000 mL | Freq: Once | INTRAVENOUS | Status: AC | PRN
  Administered 2020-10-17: 68 mL via INTRAVENOUS

## 2020-10-17 MED ORDER — SODIUM CHLORIDE 0.9 % IV BOLUS
1000.0000 mL | Freq: Once | INTRAVENOUS | Status: AC
  Administered 2020-10-17: 1000 mL via INTRAVENOUS

## 2020-10-17 NOTE — ED Provider Notes (Signed)
Patient was signed out to me pending a CTA for elevated D-dimer.  Patient CTA is negative.   Rada Hay, MD 10/17/20 [phone]

## 2020-10-17 NOTE — ED Provider Notes (Signed)
[facility] Emergency Department Provider Note   ____________________________________________   Event Date/Time   First MD Initiated Contact with Patient 10/17/20 [phone]     (approximate)  I have reviewed the triage vital signs and the nursing notes.   HISTORY  Chief Complaint Chest Pain and Neck Pain    HPI Michelle Waller is a 30 y.o. female who presents to the ED from home with a chief complaint of chest pain.  Patient reports onset of intermittent left-sided chest pain after a cardio work out 4 days ago.  States she has just started going back to the gym.  Associated with radiation into her left jaw.  Denies associated diaphoresis, nausea/vomiting or dizziness.  Has occasional palpitations.  States it is not unusual for her to have occasional chest pain or palpitations.  What concerned her was the radiation into her left jaw.  Denies recent fever, cough, abdominal pain, dysuria or diarrhea.  Denies recent travel, trauma or hormone use.  Patient is 9 months postpartum, not breast-feeding.    Past Medical History:  Diagnosis Date   Allergy    Anemia    Anxiety    Asthma    no inhalers   Eating disorder    Family history of adverse reaction to anesthesia    mom-n/v   Frequent headaches    Gastritis    GERD (gastroesophageal reflux disease)    Hiatal hernia    Hypothyroid    IBS (irritable bowel syndrome)    Kidney stones    Obsessive compulsive disorder    Ovarian cyst    Pregnancy induced hypertension    Sjogren's disease ([place])    Sjogren's syndrome ([facility])    Thyroid disease    UTI (urinary tract infection)    Vaginal Pap smear, abnormal    Vision abnormalities     Patient Active Problem List   Diagnosis Date Noted   Snoring 07/01/2020   Blood pressure alteration 03/05/2020   Seasonal and perennial allergic rhinitis 04/16/2019   Palpitations 01/15/2019   Nonallopathic lesion of lumbosacral region 10/02/2017   Nonallopathic lesion of  sacral region 10/02/2017   Irritable bowel syndrome 06/21/2017   Routine general medical examination at a health care facility 06/14/2017   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 Authorizing Provider  escitalopram (LEXAPRO) 5 MG tablet Take 1 tablet (5 mg total) by mouth every evening. 10/07/20   Hoyt Koch, MD  levothyroxine (SYNTHROID) 50 MCG tablet Take 1 tablet (50 mcg total) by mouth daily before breakfast. 03/30/20   Hoyt Koch, MD  Multiple Vitamins-Minerals (ADULT GUMMY) CHEW Chew 6 capsules by mouth daily.    [provider]  omeprazole (PRILOSEC) 20 MG capsule Take 40 mg by mouth at bedtime.    [provider]  ondansetron [facility]) 4  MG tablet Take 1 tablet (4 mg total) by mouth every 8 (eight) hours as needed for nausea or vomiting. 09/10/20   Lin Landsman, MD  Probiotic CHEW Chew 2 capsules by mouth daily.    [provider]  tamsulosin (FLOMAX) 0.4 MG CAPS capsule Take 1 capsule (0.4 mg total) by mouth daily. 09/28/20   Billey Co, MD  albuterol (PROVENTIL HFA;VENTOLIN HFA) 108 (90 Base) MCG/ACT inhaler Inhale 2 puffs into the lungs every 4 (four) hours as needed for wheezing or shortness of breath. 11/19/17 07/05/19  Lyndal Pulley, DO  Fluticasone Propionate Truett Perna) 93 MCG/ACT EXHU Place 32 sprays into the nose in the morning and at bedtime. 05/27/19  07/05/19  Valentina Shaggy, MD  sucralfate (CARAFATE) 1 g tablet Take 1 tablet (1 g total) by mouth 4 (four) times daily -  with meals and at bedtime. 06/20/19 07/05/19  Mauri Pole, MD    Allergies Soy allergy, Methylprednisolone, and Montelukast  Family History  Problem Relation Age of Onset   Thyroid disease Mother    Hypertension Father    Diabetes Father    Thyroid disease Sister    Cancer Paternal Grandmother        breast   Diabetes Paternal Grandfather    Heart disease Paternal Grandfather    Depression Mother    Atrial fibrillation Mother    Colon polyps Father    Arthritis Maternal Grandmother    Heart disease Maternal Grandmother    Hypertension Paternal Grandmother    Hypertension Paternal Grandfather    Diabetes Mellitus II Sister    Hypothyroidism Sister    Colon cancer Neg Hx    Esophageal cancer Neg Hx    Rectal cancer Neg Hx    Stomach cancer Neg Hx     Social History Social History   Tobacco Use   Smoking status: Former    Packs/day: 0.25    Years: 1.00    Pack years: 0.25    Types: Cigarettes    Quit date: 11/09/2011    Years since quitting: 8.9   Smokeless tobacco: Never  Vaping Use   Vaping Use: Never used  Substance Use Topics   Alcohol use: Not Currently    Comment: occasionally    Drug use: Never    Review of Systems  Constitutional: No fever/chills Eyes: No visual changes. ENT: No sore throat. Cardiovascular: Positive for chest pain. Respiratory: Denies shortness of breath. Gastrointestinal: No abdominal pain.  No nausea, no vomiting.  No diarrhea.  No constipation. Genitourinary: Negative for dysuria. Musculoskeletal: Negative for back pain. Skin: Negative for rash. Neurological: Negative for headaches, focal weakness or numbness.   ____________________________________________   PHYSICAL EXAM:  VITAL SIGNS: ED Triage Vitals  Enc Vitals Group     BP 10/16/20 2213 134/86     Pulse Rate 10/16/20 2213 69      Resp 10/16/20 2213 16     Temp 10/16/20 2213 99 F (37.2 C)     Temp Source 10/16/20 2213 Oral     SpO2 10/16/20 2213 98 %     Weight 10/16/20 2214 150 lb (68 kg)     Height 10/16/20 2214 5\' 2"  (1.575 m)     Head Circumference --      Peak Flow --      Pain Score 10/16/20 2214 5     Pain Loc --      Pain Edu? --      Excl. in [place]? --  Constitutional: Alert and oriented. Well appearing and in no acute distress. Eyes: Conjunctivae are normal. PERRL. EOMI. Head: Atraumatic. Nose: No congestion/rhinnorhea. Mouth/Throat: Mucous membranes are moist.  No dental abnormality or jaw pain on left side Neck: No stridor.  No carotid bruits.  No thyromegaly.  Left trapezius tightening. Cardiovascular: Normal rate, regular rhythm. Grossly normal heart sounds.  Good peripheral circulation. Respiratory: Normal respiratory effort.  No retractions. Lungs CTAB. Gastrointestinal: Soft and nontender to light or deep palpation. No distention. No abdominal bruits. No CVA tenderness. Musculoskeletal: No lower extremity tenderness nor edema.  No joint effusions. Neurologic:  Normal speech and language. No gross focal neurologic deficits are appreciated. No gait instability. Skin:  Skin is warm, dry and intact. No rash noted. Psychiatric: Mood and affect are normal. Speech and behavior are normal.  ____________________________________________   LABS (all labs ordered are listed, but only abnormal results are displayed)  Labs Reviewed  BASIC METABOLIC PANEL - Abnormal; Notable for the following components:      Result Value   Calcium 8.7 (*)    All other components within normal limits  CBC - Abnormal; Notable for the following components:   HCT 35.3 (*)    All other components within normal limits  D-DIMER, QUANTITATIVE - Abnormal; Notable for the following components:   D-Dimer, Quant 0.79 (*)    All other components within normal limits  CK  TSH  T4, FREE  POC URINE PREG, ED  TROPONIN I  (HIGH SENSITIVITY)  TROPONIN I (HIGH SENSITIVITY)   ____________________________________________  EKG  ED ECG REPORT I, Roper Tolson J, the attending physician, personally viewed and interpreted this ECG.   Date: 10/17/2020  EKG Time: 2209  Rate: 65  Rhythm: normal sinus rhythm  Axis: Normal  Intervals:none  ST&T Change: Nonspecific  ____________________________________________  RADIOLOGY I, Ladasia Sircy J, personally viewed and evaluated these images (plain radiographs) as part of my medical decision making, as well as reviewing the written report by the radiologist.  ED MD interpretation: No acute cardiopulmonary process  Official radiology report(s): DG Chest 2 View  Result Date: 10/16/2020 CLINICAL DATA:  Chest pain. EXAM: CHEST - 2 VIEW COMPARISON:  Chest x-ray 06/02/2020, CT chest 06/02/2020 FINDINGS: The heart and mediastinal contours are within normal limits. No focal consolidation. No pulmonary edema. No pleural effusion. No pneumothorax. No acute osseous abnormality. IMPRESSION: No active cardiopulmonary disease. Electronically Signed   By: Iven Finn M.D.   On: 10/16/2020 22:35    ____________________________________________   PROCEDURES  Procedure(s) performed (including Critical Care):  .1-3 Lead EKG Interpretation Performed by: Paulette Blanch, MD Authorized by: Paulette Blanch, MD     Interpretation: normal     ECG rate:  65   ECG rate assessment: normal     Rhythm: sinus rhythm     Ectopy: none     Conduction: normal   Comments:     Patient placed on cardiac monitor to evaluate for arrhythmias   ____________________________________________   INITIAL IMPRESSION / ASSESSMENT AND PLAN / ED COURSE  As part of my medical decision making, I reviewed the following data within the [place] notes reviewed and incorporated, Labs reviewed, EKG interpreted, Old chart reviewed, Radiograph reviewed, and Notes from prior ED visits      30 year old female presenting with a 4-day history of chest pain. Differential diagnosis includes, but is not limited to, ACS, aortic dissection, pulmonary embolism, cardiac tamponade, pneumothorax, pneumonia, pericarditis, myocarditis, GI-related causes including esophagitis/gastritis, and musculoskeletal chest  wall pain.     2 sets of troponins, EKG and chest x-ray unremarkable.  Will check D-dimer, thyroid panel and reassess.  Clinical Course as of 10/17/20 3903  Nancy Fetter Oct 17, 2020  0092 Elevated D-dimer noted, will obtain CTA chest to evaluate for PE.  Noted patient also had elevation of her D-dimer in May/2022 with negative CTA chest [JS]  0656 Care transferred to Dr. Starleen Blue at change of shift pending CTA chest.  Anticipate patient may be discharged home if negative. [JS]    Clinical Course User Index [JS] Paulette Blanch, MD     ____________________________________________   FINAL CLINICAL IMPRESSION(S) / ED DIAGNOSES  Final diagnoses:  Chest pain, unspecified type     ED Discharge Orders     None        Note:  This document was prepared using Dragon voice recognition software and may include unintentional dictation errors.    Paulette Blanch, MD 10/17/20 [phone]

## 2020-10-17 NOTE — Discharge Instructions (Addendum)
Your chest x-ray and blood work were reassuring.  Your D-dimer was mildly elevated so we got a CT scan of your chest to make sure there was no blood clot.  The CT scan was negative.  If you continue to have pain, please follow-up with your primary care physician.

## 2020-10-18 ENCOUNTER — Other Ambulatory Visit: Payer: Self-pay | Admitting: *Deleted

## 2020-10-18 ENCOUNTER — Encounter: Payer: Self-pay | Admitting: Internal Medicine

## 2020-10-18 DIAGNOSIS — N200 Calculus of kidney: Secondary | ICD-10-CM

## 2020-10-19 ENCOUNTER — Ambulatory Visit
Admission: RE | Admit: 2020-10-19 | Discharge: 2020-10-19 | Disposition: A | Discharge status: Home / Self Care | Payer: 59 | Source: Ambulatory Visit | Attending: Urology | Admitting: Urology

## 2020-10-19 ENCOUNTER — Ambulatory Visit
Admission: RE | Admit: 2020-10-19 | Discharge: 2020-10-19 | Disposition: A | Discharge status: Home / Self Care | Payer: 59 | Attending: Urology | Admitting: Urology

## 2020-10-19 ENCOUNTER — Encounter: Payer: Self-pay | Admitting: Urology

## 2020-10-19 ENCOUNTER — Ambulatory Visit (INDEPENDENT_AMBULATORY_CARE_PROVIDER_SITE_OTHER): Payer: 59 | Admitting: Urology

## 2020-10-19 ENCOUNTER — Other Ambulatory Visit: Payer: Self-pay

## 2020-10-19 ENCOUNTER — Other Ambulatory Visit
Admission: RE | Admit: 2020-10-19 | Discharge: 2020-10-19 | Disposition: A | Discharge status: Home / Self Care | Payer: 59 | Source: Home / Self Care | Attending: Urology | Admitting: Urology

## 2020-10-19 VITALS — BP 120/68 | HR 70 | Ht 62.0 in | Wt 151.0 lb

## 2020-10-19 DIAGNOSIS — N200 Calculus of kidney: Secondary | ICD-10-CM

## 2020-10-19 LAB — URINALYSIS, COMPLETE (UACMP) WITH MICROSCOPIC
Bilirubin Urine: NEGATIVE
Glucose, UA: NEGATIVE mg/dL
Ketones, ur: NEGATIVE mg/dL
Leukocytes,Ua: NEGATIVE
Nitrite: NEGATIVE
Protein, ur: NEGATIVE mg/dL
Specific Gravity, Urine: 1.025 (ref 1.005–1.030)
pH: 7 (ref 5.0–8.0)

## 2020-10-19 NOTE — Progress Notes (Signed)
   10/19/2020 4:19 PM   [name] 1990-04-16 [id number]  Reason for visit: Follow up nephrolithiasis  HPI: 30 year old female with history of nephrolithiasis and multiple stone events who presented to the ED on 09/25/2020 with nausea and vomiting right-sided flank pain.  CT at that time showed a 5 mm right distal ureteral stone, and a 5 mm right proximal ureteral stone with hydronephrosis.  I saw her in clinic on 09/28/2020, and she was feeling much better and opted for a trial of medical expulsive therapy.  She has done well since that time and really denies any significant flank pain or nausea and vomiting.  She had a recent ER visit for chest pain with negative work-up.  I personally viewed and interpreted the KUB today that shows no obvious persistent ureteral stone, and stable right renal stones.  Urinalysis today with 20-50 squamous cells, 6-10 WBCs, 6-10 RBCs, few bacteria, and nitrite negative, no leukocytes.  We discussed that she may have passed the stone spontaneously, as her symptoms have resolved and imaging is reassuring, though she still has some low-grade microscopic hematuria on urinalysis today.  Regardless, she is feeling well and would like to continue observation at this time.  Return precautions discussed extensively.  I recommended follow-up in 1 month for a repeat urinalysis and KUB to confirm clearance of microscopic hematuria.  Billey Co, [facility] [address], [place], [place] [address] [phone]

## 2020-11-10 ENCOUNTER — Encounter: Payer: Self-pay | Admitting: Internal Medicine

## 2020-11-10 DIAGNOSIS — R202 Paresthesia of skin: Secondary | ICD-10-CM

## 2020-11-16 ENCOUNTER — Ambulatory Visit (INDEPENDENT_AMBULATORY_CARE_PROVIDER_SITE_OTHER): Payer: 59 | Admitting: Obstetrics

## 2020-11-16 ENCOUNTER — Ambulatory Visit: Payer: 59 | Admitting: Urology

## 2020-11-16 ENCOUNTER — Other Ambulatory Visit (HOSPITAL_COMMUNITY)
Admission: RE | Admit: 2020-11-16 | Discharge: 2020-11-16 | Disposition: A | Discharge status: Home / Self Care | Payer: 59 | Source: Ambulatory Visit | Attending: Obstetrics | Admitting: Obstetrics

## 2020-11-16 ENCOUNTER — Other Ambulatory Visit: Payer: Self-pay

## 2020-11-16 ENCOUNTER — Encounter: Payer: Self-pay | Admitting: Obstetrics

## 2020-11-16 VITALS — BP 120/72 | Ht 62.0 in | Wt 152.0 lb

## 2020-11-16 DIAGNOSIS — N898 Other specified noninflammatory disorders of vagina: Secondary | ICD-10-CM | POA: Insufficient documentation

## 2020-11-16 MED ORDER — FLUCONAZOLE 150 MG PO TABS: 150.0000 mg | ORAL_TABLET | Freq: Once | ORAL | 0 refills | Status: AC

## 2020-11-16 NOTE — Progress Notes (Signed)
[name] is a 30 y.o. G1P1001 who LMP was Patient's last menstrual period was 11/02/2020., presents today for a problem visit.   Patient complains of an abnormal vaginal discharge for 1 month. Discharge described as: white, thick, curd-like, and odorless. Vaginal symptoms include vulvar itching.Vulvar symptoms include none.STI Risk: Very low risk of STD exposure.   Other associated symptoms: none.Menstrual pattern: She had been bleeding regularly. Contraception: none.  She denies abdominal pain recent antibiotic exposure, denies  changes in soaps, detergents coinciding with the onset of her symptoms.  She has previously self treated or been under treatment by another provider for these symptoms.    She also c/o some intermittent sharp shooting pains in her breasts. These occur rarely, but she asks for an eval : Breast exam- B cup size, slightly everted nipples, no nipple discharge, no irritation  noted. Only slightly cystic in the upper outer quadrants.  BP 114/70   Ht 5\' 2"  (1.575 m)   Wt 152 lb (68.9 kg)   LMP 11/02/2020   BMI 27.80 kg/m   Review of Systems  Constitutional: Negative.   HENT: Negative.    Eyes: Negative.   Respiratory: Negative.    Cardiovascular: Negative.   Gastrointestinal: Negative.   Genitourinary: Negative.   Musculoskeletal: Negative.   Skin:  Positive for itching.       Vaginal itching with discharge   Neurological: Negative.   Endo/Heme/Allergies: Negative.   Psychiatric/Behavioral: Negative.     Physical Exam Constitutional:      Appearance: Normal appearance. She is normal weight.  Cardiovascular:     Rate and Rhythm: Normal rate and regular rhythm.     Pulses: Normal pulses.     Heart sounds: Normal heart sounds.  Pulmonary:     Effort: Pulmonary effort is normal.     Breath sounds: Normal breath sounds.  Abdominal:     General: Abdomen is flat.     Palpations: Abdomen is soft.  Genitourinary:    General: Normal vulva.     Comments:  No external lesions or irritation noted. White, non malodorous discharge noted vaginally in the in the introitus. Nobothian cyst noted at 2 o'clock on the cervix. No CMT, Uterus is anteverted, nonenlarged, no adnexal tenderness or enlargement. Musculoskeletal:        General: Normal range of motion.  Skin:    General: Skin is warm.  Neurological:     General: No focal deficit present.     Mental Status: She is alert.  Psychiatric:        Mood and Affect: Mood normal.   A: Vaginal discharge- suspect yeast       Normal breast exam- likely occasional pain related to her initial breastfeeding after her SVD in January, and later, her attempt to relactate.  Plan: aptima swab sent. RX for Diflucan. Will f/u based on the lab results.  A total of 30 minutes spent in care of this patient.  Imagene Riches, CNM  11/16/2020 4:08 PM

## 2020-11-18 ENCOUNTER — Other Ambulatory Visit: Payer: Self-pay

## 2020-11-18 ENCOUNTER — Other Ambulatory Visit (INDEPENDENT_AMBULATORY_CARE_PROVIDER_SITE_OTHER): Payer: 59

## 2020-11-18 DIAGNOSIS — R202 Paresthesia of skin: Secondary | ICD-10-CM | POA: Diagnosis not present

## 2020-11-18 LAB — VITAMIN D 25 HYDROXY (VIT D DEFICIENCY, FRACTURES): VITD: 28.93 ng/mL — ABNORMAL LOW (ref 30.00–100.00)

## 2020-11-18 LAB — CERVICOVAGINAL ANCILLARY ONLY
Bacterial Vaginitis (gardnerella): NEGATIVE
Candida Glabrata: NEGATIVE
Candida Vaginitis: NEGATIVE
Comment: NEGATIVE
Comment: NEGATIVE
Comment: NEGATIVE

## 2020-11-18 LAB — VITAMIN B12: Vitamin B-12: 989 pg/mL — ABNORMAL HIGH (ref 211–911)

## 2020-11-18 LAB — TSH: TSH: 1.32 u[IU]/mL (ref 0.35–5.50)

## 2020-11-19 ENCOUNTER — Other Ambulatory Visit: Payer: Self-pay | Admitting: Obstetrics

## 2020-11-19 ENCOUNTER — Encounter: Payer: Self-pay | Admitting: Obstetrics

## 2020-11-19 ENCOUNTER — Other Ambulatory Visit: Payer: Self-pay | Admitting: Internal Medicine

## 2020-11-23 ENCOUNTER — Encounter: Payer: Self-pay | Admitting: Urology

## 2020-11-23 ENCOUNTER — Other Ambulatory Visit
Admission: RE | Admit: 2020-11-23 | Discharge: 2020-11-23 | Disposition: A | Discharge status: Home / Self Care | Payer: 59 | Source: Ambulatory Visit | Attending: Urology | Admitting: Urology

## 2020-11-23 ENCOUNTER — Other Ambulatory Visit: Payer: Self-pay | Admitting: *Deleted

## 2020-11-23 ENCOUNTER — Ambulatory Visit
Admission: RE | Admit: 2020-11-23 | Discharge: 2020-11-23 | Disposition: A | Discharge status: Home / Self Care | Payer: 59 | Source: Ambulatory Visit | Attending: Urology | Admitting: Urology

## 2020-11-23 ENCOUNTER — Other Ambulatory Visit: Payer: Self-pay

## 2020-11-23 ENCOUNTER — Ambulatory Visit (INDEPENDENT_AMBULATORY_CARE_PROVIDER_SITE_OTHER): Payer: 59 | Admitting: Urology

## 2020-11-23 ENCOUNTER — Ambulatory Visit
Admission: RE | Admit: 2020-11-23 | Discharge: 2020-11-23 | Disposition: A | Discharge status: Home / Self Care | Payer: 59 | Attending: Urology | Admitting: Urology

## 2020-11-23 VITALS — BP 121/78 | HR 78 | Ht 62.0 in | Wt 149.0 lb

## 2020-11-23 DIAGNOSIS — N200 Calculus of kidney: Secondary | ICD-10-CM

## 2020-11-23 LAB — URINALYSIS, COMPLETE (UACMP) WITH MICROSCOPIC
Bacteria, UA: NONE SEEN
Bilirubin Urine: NEGATIVE
Glucose, UA: NEGATIVE mg/dL
Ketones, ur: NEGATIVE mg/dL
Leukocytes,Ua: NEGATIVE
Nitrite: NEGATIVE
Protein, ur: NEGATIVE mg/dL
Specific Gravity, Urine: 1.01 (ref 1.005–1.030)
pH: 7 (ref 5.0–8.0)

## 2020-11-23 NOTE — Progress Notes (Signed)
   11/23/2020 10:04 AM   Michelle Waller 04/04/1990 [id number]  Reason for visit: Follow up nephrolithiasis  HPI: 30 year old female with history of recurrent stones who presented to the ER on 09/25/2020 with right-sided flank pain and CT showed 2 ureteral stones measuring 5 mm each, one in the distal ureter, and 1 in the proximal ureter.  She opted for trial of medical expulsive therapy.  At follow-up the stones were not visible on KUB, but she had persistent microscopic hematuria.  She continues to have some intermittent right-sided pain, but she reports she has had this since she was a teenager.  I personally viewed and interpreted her KUB today that shows no obvious ureteral stones, and urinalysis is pending.  We discussed options at length including CT or right ureteroscopy, laser lithotripsy, stent placement.  She would like to avoid surgery if at all possible.  I recommended following up the urinalysis today and if there is persistent microscopic hematuria or pyuria, repeating a CT scan, and if ureteral stone still present would recommend right ureteroscopy, laser lithotripsy, and stent placement.  Call with urinalysis results, if persistent microscopic hematuria or pyuria will order repeat CT stone protocol   Billey Co, MD  [facility] [address] [employer] [place], [place] [address] [phone]

## 2020-11-23 NOTE — Patient Instructions (Signed)
We will call with your urinalysis results, if persistent microscopic blood will order CT scan to make sure ureteral stones have passed.  If ureteral stones are still there would recommend ureteroscopy and laser lithotripsy to remove the stone since it has been 2 months.  Laser Therapy for Kidney Stones Laser therapy for kidney stones is a procedure to break up small, hard mineral deposits that form in the kidney (kidney stones). The procedure is done using a device that produces a focused beam of light (laser). The laser breaks up kidney stones into pieces that are small enough to be passed out of the body through urination or removed from the body during the procedure. You may need laser therapy if you have kidney stones that are painful or block your urinary tract. This procedure is done by inserting a tube (ureteroscope) into your kidney through the urethral opening. The urethra is the part of the body that drains urine from the bladder. In women, the urethra opens above the vaginal opening. In men, the urethra opens at the tip of the penis. The ureteroscope is inserted through the urethra, and surgical instruments are moved through the bladder and the muscular tube that connects the kidney to the bladder (ureter) until they reach the kidney. Tell a health care provider about: Any allergies you have. All medicines you are taking, including vitamins, herbs, eye drops, creams, and over-the-counter medicines. Any problems you or family members have had with anesthetic medicines. Any blood disorders you have. Any surgeries you have had. Any medical conditions you have. Whether you are pregnant or may be pregnant. What are the risks? Generally, this is a safe procedure. However, problems may occur, including: Infection. Bleeding. Allergic reactions to medicines. Damage to the urethra, bladder, or ureter. Urinary tract infection (UTI). Narrowing of the urethra (urethral stricture). Difficulty  passing urine. Blockage of the kidney caused by a fragment of kidney stone. What happens before the procedure? Medicines Ask your health care provider about: Changing or stopping your regular medicines. This is especially important if you are taking diabetes medicines or blood thinners. Taking medicines such as aspirin and ibuprofen. These medicines can thin your blood. Do not take these medicines unless your health care provider tells you to take them. Taking over-the-counter medicines, vitamins, herbs, and supplements. Eating and drinking Follow instructions from your health care provider about eating and drinking, which may include: 8 hours before the procedure - stop eating heavy meals or foods, such as meat, fried foods, or fatty foods. 6 hours before the procedure - stop eating light meals or foods, such as toast or cereal. 6 hours before the procedure - stop drinking milk or drinks that contain milk. 2 hours before the procedure - stop drinking clear liquids. Staying hydrated Follow instructions from your health care provider about hydration, which may include: Up to 2 hours before the procedure - you may continue to drink clear liquids, such as water, clear fruit juice, black coffee, and plain tea.  General instructions You may have a physical exam before the procedure. You may also have tests, such as imaging tests and blood or urine tests. If your ureter is too narrow, your health care provider may place a soft, flexible tube (stent) inside of it. The stent may be placed days or weeks before your laser therapy procedure. Plan to have someone take you home from the hospital or clinic. If you will be going home right after the procedure, plan to have someone stay with you  for 24 hours. Do not use any products that contain nicotine or tobacco for at least 4 weeks before the procedure. These products include cigarettes, e-cigarettes, and chewing tobacco. If you need help quitting, ask  your health care provider. Ask your health care provider: How your surgical site will be marked or identified. What steps will be taken to help prevent infection. These may include: Removing hair at the surgery site. Washing skin with a germ-killing soap. Taking antibiotic medicine. What happens during the procedure?  An IV will be inserted into one of your veins. You will be given one or more of the following: A medicine to help you relax (sedative). A medicine to numb the area (local anesthetic). A medicine to make you fall asleep (general anesthetic). A ureteroscope will be inserted into your urethra. The ureteroscope will send images to a video screen in the operating room to guide your surgeon to the area of your kidney that will be treated. A small, flexible tube will be threaded through the ureteroscope and into your bladder and ureter, up to your kidney. The laser device will be inserted into your kidney through the tube. Your surgeon will pulse the laser on and off to break up kidney stones. A surgical instrument that has a tiny wire basket may be inserted through the tube into your kidney to remove the pieces of broken kidney stone. The procedure may vary among health care providers and hospitals. What happens after the procedure? Your blood pressure, heart rate, breathing rate, and blood oxygen level will be monitored until you leave the hospital or clinic. You will be given pain medicine as needed. You may continue to receive antibiotics. You may have a stent temporarily placed in your ureter. Do not drive for 24 hours if you were given a sedative during your procedure. You may be given a strainer to collect any stone fragments that you pass in your urine. Your health care provider may have these tested. Summary Laser therapy for kidney stones is a procedure to break up kidney stones into pieces that are small enough to be passed out of the body through urination or removed  during the procedure. Follow instructions from your health care provider about eating and drinking before the procedure. During the procedure, the ureteroscope will send images to a video screen to guide your surgeon to the area of your kidney that will be treated. Do not drive for 24 hours if you were given a sedative during your procedure. This information is not intended to replace advice given to you by your health care provider. Make sure you discuss any questions you have with your health care provider. Document Revised: 09/20/2017 Document Reviewed: 09/20/2017 Elsevier Patient Education  [place].

## 2020-11-24 ENCOUNTER — Telehealth: Payer: Self-pay

## 2020-11-24 NOTE — Telephone Encounter (Signed)
-----   Message from Billey Co, MD sent at 11/24/2020  8:14 AM EDT ----- Good news, urinalysis completely normal and she likely passed her stone.  She has recurrence of her right-sided flank pain she should let us know, and RTC 1 year with KUB prior for surveillance of her bilateral smaller renal stones  Nickolas Madrid, MD 11/24/2020

## 2020-11-24 NOTE — Telephone Encounter (Signed)
Called pt informed her of the information below. Pt voiced understanding. KUB ordered. Appt scheduled.

## 2020-11-29 ENCOUNTER — Encounter: Payer: Self-pay | Admitting: Internal Medicine

## 2020-12-02 ENCOUNTER — Emergency Department
Admission: EM | Admit: 2020-12-02 | Discharge: 2020-12-02 | Disposition: A | Discharge status: Home / Self Care | Payer: 59 | Attending: Emergency Medicine | Admitting: Emergency Medicine

## 2020-12-02 ENCOUNTER — Other Ambulatory Visit: Payer: Self-pay

## 2020-12-02 ENCOUNTER — Emergency Department: Payer: 59

## 2020-12-02 ENCOUNTER — Ambulatory Visit
Admission: EM | Admit: 2020-12-02 | Discharge: 2020-12-02 | Disposition: A | Discharge status: Home / Self Care | Payer: 59

## 2020-12-02 DIAGNOSIS — M79662 Pain in left lower leg: Secondary | ICD-10-CM

## 2020-12-02 DIAGNOSIS — Z79899 Other long term (current) drug therapy: Secondary | ICD-10-CM | POA: Diagnosis not present

## 2020-12-02 DIAGNOSIS — M7989 Other specified soft tissue disorders: Secondary | ICD-10-CM

## 2020-12-02 DIAGNOSIS — Z87891 Personal history of nicotine dependence: Secondary | ICD-10-CM | POA: Insufficient documentation

## 2020-12-02 DIAGNOSIS — I83812 Varicose veins of left lower extremities with pain: Secondary | ICD-10-CM | POA: Diagnosis not present

## 2020-12-02 DIAGNOSIS — J45909 Unspecified asthma, uncomplicated: Secondary | ICD-10-CM | POA: Insufficient documentation

## 2020-12-02 DIAGNOSIS — E039 Hypothyroidism, unspecified: Secondary | ICD-10-CM | POA: Diagnosis not present

## 2020-12-02 DIAGNOSIS — Z7951 Long term (current) use of inhaled steroids: Secondary | ICD-10-CM | POA: Insufficient documentation

## 2020-12-02 DIAGNOSIS — M79605 Pain in left leg: Secondary | ICD-10-CM

## 2020-12-02 NOTE — ED Provider Notes (Signed)
[facility]    CSN: [id number] Arrival date & time: 12/02/20  1257      History   Chief Complaint Chief Complaint  Patient presents with   Leg Pain    HPI Jamaira Sherk is a 30 y.o. female.   Patient presents with left calf tenderness beginning last night, endorses that pain has radiated into posterior left thigh and that she can feel nodule.  Denies swelling, erythema, warmth, numbness, tingling, prior injury or trauma.  Range of motion intact.  Able to bear weight.  History of elevated D-dimers.  Endorses that she has been having palpitations and a history of a flutter.  Attempted to contact cardiologist.  Past Medical History:  Diagnosis Date   Allergy    Anemia    Anxiety    Asthma    no inhalers   Eating disorder    Family history of adverse reaction to anesthesia    mom-n/v   Frequent headaches    Gastritis    GERD (gastroesophageal reflux disease)    Hiatal hernia    Hypothyroid    IBS (irritable bowel syndrome)    Kidney stones    Obsessive compulsive disorder    Ovarian cyst    Pregnancy induced hypertension    Sjogren's disease ([place])    Sjogren's syndrome ([facility])    Thyroid disease    UTI (urinary tract infection)    Vaginal Pap smear, abnormal    Vision abnormalities     Patient Active Problem List   Diagnosis Date Noted   Snoring 07/01/2020   Blood pressure alteration 03/05/2020   Seasonal and perennial allergic rhinitis 04/16/2019   Palpitations 01/15/2019   Nonallopathic lesion of lumbosacral region 10/02/2017   Nonallopathic lesion of sacral region 10/02/2017   Irritable bowel syndrome 06/21/2017   Routine general medical examination at a health care facility 06/14/2017   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   Nonallopathic lesion of thoracic region 04/30/2017   Nonallopathic lesion of rib cage 04/30/2017   Nonallopathic lesion of cervical region 04/30/2017   Chest pain 01/31/2017   ANA positive 10/31/2016   Anxiety 08/15/2016    Hypothyroidism 02/03/2016    Past Surgical History:  Procedure Laterality Date   ADENOIDECTOMY     CERVIX LESION DESTRUCTION     COLONOSCOPY     x2   CYSTOSCOPY     ESOPHAGOGASTRODUODENOSCOPY     x3   KIDNEY STONE SURGERY     KNEE ARTHROSCOPY Right    KNEE SURGERY      OB History     Gravida  1   Para  1   Term  1   Preterm  0   AB  0   Living  1      SAB  0   IAB  0   Ectopic  0   Multiple  0   Live Births  1            Home Medications    Prior to Admission medications   Medication Sig Start Date End Date Taking? Authorizing Provider  escitalopram (LEXAPRO) 5 MG tablet Take 1 tablet (5 mg total) by mouth every evening. 10/07/20  Yes Hoyt Koch, MD  levothyroxine (SYNTHROID) 50 MCG tablet Take 1 tablet (50 mcg total) by mouth daily before breakfast. 03/30/20  Yes Hoyt Koch, MD  Multiple Vitamins-Minerals (ADULT GUMMY) CHEW Chew 2 capsules by mouth daily.   Yes [provider]  Omeprazole 20 MG TBEC Take 20  mg by mouth daily at 2 PM.   Yes [provider]  Probiotic CHEW Chew 2 capsules by mouth daily.   Yes [provider]  albuterol (PROVENTIL HFA;VENTOLIN HFA) 108 (90 Base) MCG/ACT inhaler Inhale 2 puffs into the lungs every 4 (four) hours as needed for wheezing or shortness of breath. 11/19/17 07/05/19  Lyndal Pulley, DO  Fluticasone Propionate Truett Perna) 93 MCG/ACT EXHU Place 32 sprays into the nose in the morning and at bedtime. 05/27/19 07/05/19  Valentina Shaggy, MD  sucralfate (CARAFATE) 1 g tablet Take 1 tablet (1 g total) by mouth 4 (four) times daily -  with meals and at bedtime. 06/20/19 07/05/19  Mauri Pole, MD    Family History Family History  Problem Relation Age of Onset   Thyroid disease Mother    Hypertension Father    Diabetes Father    Thyroid disease Sister    Cancer Paternal Grandmother        breast   Diabetes Paternal Grandfather    Heart disease Paternal  Grandfather    Depression Mother    Atrial fibrillation Mother    Colon polyps Father    Arthritis Maternal Grandmother    Heart disease Maternal Grandmother    Hypertension Paternal Grandmother    Hypertension Paternal Grandfather    Diabetes Mellitus II Sister    Hypothyroidism Sister    Colon cancer Neg Hx    Esophageal cancer Neg Hx    Rectal cancer Neg Hx    Stomach cancer Neg Hx     Social History Social History   Tobacco Use   Smoking status: Former    Packs/day: 0.25    Years: 1.00    Pack years: 0.25    Types: Cigarettes    Quit date: 11/09/2011    Years since quitting: 9.0   Smokeless tobacco: Never  Vaping Use   Vaping Use: Never used  Substance Use Topics   Alcohol use: Not Currently    Comment: occasionally    Drug use: Never     Allergies   Soy allergy, Methylprednisolone, and Montelukast   Review of Systems Review of Systems  Constitutional: Negative.   Respiratory: Negative.    Cardiovascular: Negative.   Skin: Negative.     Physical Exam Triage Vital Signs ED Triage Vitals  Enc Vitals Group     BP 12/02/20 1330 99/85     Pulse Rate 12/02/20 1330 90     Resp 12/02/20 1330 18     Temp 12/02/20 1330 98.2 F (36.8 C)     Temp Source 12/02/20 1330 Oral     SpO2 12/02/20 1330 99 %     Weight 12/02/20 1329 148 lb (67.1 kg)     Height 12/02/20 1329 5\' 2"  (1.575 m)     Head Circumference --      Peak Flow --      Pain Score 12/02/20 1329 0     Pain Loc --      Pain Edu? --      Excl. in [place]? --    No data found.  Updated Vital Signs BP 99/85 (BP Location: Left Arm)   Pulse 90   Temp 98.2 F (36.8 C) (Oral)   Resp 18   Ht 5\' 2"  (1.575 m)   Wt 148 lb (67.1 kg)   LMP 12/02/2020   SpO2 99%   BMI 27.07 kg/m   Visual Acuity Right Eye Distance:   Left Eye Distance:  Bilateral Distance:    Right Eye Near:   Left Eye Near:    Bilateral Near:     Physical Exam Constitutional:      Appearance: Normal appearance. She is normal  weight.  Eyes:     Extraocular Movements: Extraocular movements intact.  Pulmonary:     Effort: Pulmonary effort is normal.  Musculoskeletal:     Comments: Diffuse tenderness throughout the left calf muscle, no erythema or edema noted, range of motion intact  Skin:    General: Skin is warm and dry.  Neurological:     Mental Status: She is alert and oriented to person, place, and time. Mental status is at baseline.  Psychiatric:        Mood and Affect: Mood normal.        Behavior: Behavior normal.     UC Treatments / Results  Labs (all labs ordered are listed, but only abnormal results are displayed) Labs Reviewed - No data to display  EKG   Radiology US Venous Img Lower Unilateral Left (DVT)  Result Date: 12/02/2020 CLINICAL DATA:  Pain and swelling, previous history of DVT EXAM: Left LOWER EXTREMITY VENOUS DOPPLER ULTRASOUND TECHNIQUE: Gray-scale sonography with compression, as well as color and duplex ultrasound, were performed to evaluate the deep venous system(s) from the level of the common femoral vein through the popliteal and proximal calf veins. COMPARISON:  None. FINDINGS: VENOUS Normal compressibility of the common femoral, superficial femoral, and popliteal veins, as well as the visualized calf veins. Visualized portions of profunda femoral vein and great saphenous vein unremarkable. No filling defects to suggest DVT on grayscale or color Doppler imaging. Doppler waveforms show normal direction of venous flow, normal respiratory plasticity and response to augmentation. Limited views of the contralateral common femoral vein are unremarkable. OTHER None. Limitations: none IMPRESSION: There is no evidence of DVT in the left lower extremity. Electronically Signed   By: Elmer Picker M.D.   On: 12/02/2020 15:32    Procedures Procedures (including critical care time)  Medications Ordered in UC Medications - No data to display  Initial Impression / Assessment and Plan  / UC Course  I have reviewed the triage vital signs and the nursing notes.  Pertinent labs & imaging results that were available during my care of the patient were reviewed by me and considered in my medical decision making (see chart for details).  Pain in the left calf  Left calf tenderness present however warmth and edema is not, based on patient history of elevated D-dimer recommended follow-up at nearest emergency department for evaluation of DVT, will defer lab work in urgent care setting if elevated patient would need to go to the emergency department for ultrasound and evaluation, discussed this with patient, in agreement with plan of care Final Clinical Impressions(s) / UC Diagnoses   Final diagnoses:  None   Discharge Instructions   None    ED Prescriptions   None    PDMP not reviewed this encounter.   Hans Eden, NP 12/02/20 [phone]

## 2020-12-02 NOTE — ED Provider Notes (Signed)
[facility] Emergency Department Provider Note ____________________________________________  Time seen: 0272  I have reviewed the triage vital signs and the nursing notes.  HISTORY  Chief Complaint  Leg Pain   HPI Michelle Waller is a 30 y.o. female with the below medical history including elevated D-dimer, presents to the ED via local urgent care.  Patient notes onset of left leg pain last night.  She denies any fevers, chills, sweats, chest pain, shortness of breath, or trauma.  Patient also denies any swelling or redness to the leg.  She gives a history of varicose veins that have been tender and painful in the past.  She denies any other complaints at this time.  Past Medical History:  Diagnosis Date   Allergy    Anemia    Anxiety    Asthma    no inhalers   Eating disorder    Family history of adverse reaction to anesthesia    mom-n/v   Frequent headaches    Gastritis    GERD (gastroesophageal reflux disease)    Hiatal hernia    Hypothyroid    IBS (irritable bowel syndrome)    Kidney stones    Obsessive compulsive disorder    Ovarian cyst    Pregnancy induced hypertension    Sjogren's disease ([place])    Sjogren's syndrome ([facility])    Thyroid disease    UTI (urinary tract infection)    Vaginal Pap smear, abnormal    Vision abnormalities     Patient Active Problem List   Diagnosis Date Noted   Snoring 07/01/2020   Blood pressure alteration 03/05/2020   Seasonal and perennial allergic rhinitis 04/16/2019   Palpitations 01/15/2019   Nonallopathic lesion of lumbosacral region 10/02/2017   Nonallopathic lesion of sacral region 10/02/2017   Irritable bowel syndrome 06/21/2017   Routine general medical examination at a health care facility 06/14/2017   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 Authorizing Provider  escitalopram (LEXAPRO) 5 MG tablet Take 1 tablet (5 mg total) by mouth every evening. 10/07/20   Hoyt Koch, MD  levothyroxine (SYNTHROID) 50 MCG tablet Take 1 tablet (50 mcg total) by mouth daily before breakfast. 03/30/20   Hoyt Koch, MD  Multiple Vitamins-Minerals (ADULT GUMMY) CHEW Chew 2 capsules by mouth daily.    [provider]  Omeprazole 20 MG TBEC Take 20 mg by mouth daily at 2 PM.    [provider]  Probiotic CHEW Chew 2 capsules by mouth daily.    [provider]  albuterol (PROVENTIL HFA;VENTOLIN HFA) 108 (90 Base) MCG/ACT inhaler Inhale 2 puffs into the lungs every 4 (four) hours as needed for wheezing or shortness of breath. 11/19/17 07/05/19  Lyndal Pulley, DO  Fluticasone Propionate Truett Perna) 93 MCG/ACT EXHU Place 32 sprays into the nose  in the morning and at bedtime. 05/27/19 07/05/19  Valentina Shaggy, MD  sucralfate (CARAFATE) 1 g tablet Take 1 tablet (1 g total) by mouth 4 (four) times daily -  with meals and at bedtime. 06/20/19 07/05/19  Mauri Pole, MD    Allergies Soy allergy, Methylprednisolone, and Montelukast  Family History  Problem Relation Age of Onset   Thyroid disease Mother    Hypertension Father    Diabetes Father    Thyroid disease Sister    Cancer Paternal Grandmother        breast   Diabetes Paternal Grandfather    Heart disease Paternal Grandfather    Depression Mother    Atrial fibrillation Mother    Colon polyps Father    Arthritis Maternal Grandmother     Heart disease Maternal Grandmother    Hypertension Paternal Grandmother    Hypertension Paternal Grandfather    Diabetes Mellitus II Sister    Hypothyroidism Sister    Colon cancer Neg Hx    Esophageal cancer Neg Hx    Rectal cancer Neg Hx    Stomach cancer Neg Hx     Social History Social History   Tobacco Use   Smoking status: Former    Packs/day: 0.25    Years: 1.00    Pack years: 0.25    Types: Cigarettes    Quit date: 11/09/2011    Years since quitting: 9.0   Smokeless tobacco: Never  Vaping Use   Vaping Use: Never used  Substance Use Topics   Alcohol use: Not Currently    Comment: occasionally    Drug use: Never    Review of Systems  Constitutional: Negative for fever. Eyes: Negative for visual changes. ENT: Negative for sore throat. Cardiovascular: Negative for chest pain. Respiratory: Negative for shortness of breath. Gastrointestinal: Negative for abdominal pain, vomiting and diarrhea. Genitourinary: Negative for dysuria. Musculoskeletal: Negative for back pain. Left leg pain  Skin: Negative for rash. Neurological: Negative for headaches, focal weakness or numbness. ____________________________________________  PHYSICAL EXAM:  VITAL SIGNS: ED Triage Vitals  Enc Vitals Group     BP 12/02/20 1444 118/83     Pulse Rate 12/02/20 1444 (!) 101     Resp 12/02/20 1444 18     Temp 12/02/20 1444 98 F (36.7 C)     Temp Source 12/02/20 1444 Oral     SpO2 12/02/20 1444 98 %     Weight 12/02/20 1443 149 lb 14.6 oz (68 kg)     Height 12/02/20 1443 5\' 2"  (1.575 m)     Head Circumference --      Peak Flow --      Pain Score 12/02/20 1443 4     Pain Loc --      Pain Edu? --      Excl. in [place]? --     Constitutional: Alert and oriented. Well appearing and in no distress. Head: Normocephalic and atraumatic. Eyes: Conjunctivae are normal. Normal extraocular movements Cardiovascular: Normal rate, regular rhythm. Normal distal pulses. Respiratory: Normal  respiratory effort. No wheezes/rales/rhonchi. Gastrointestinal: Soft and nontender. No distention. Musculoskeletal: Nontender with normal range of motion in all extremities.  Neurologic:  Normal gait without ataxia. Normal speech and language. No gross focal neurologic deficits are appreciated. Skin:  Skin is warm, dry and intact. No rash noted. Psychiatric: Mood and affect are normal. Patient exhibits appropriate insight and judgment. ____________________________________________    {LABS (pertinent positives/negatives)  ____________________________________________  {EKG  ____________________________________________   RADIOLOGY Official  radiology report(s):  US Venous Img Lower Unilateral Left (DVT)  Result Date: 12/02/2020 CLINICAL DATA:  Pain and swelling, previous history of DVT EXAM: Left LOWER EXTREMITY VENOUS DOPPLER ULTRASOUND TECHNIQUE: Gray-scale sonography with compression, as well as color and duplex ultrasound, were performed to evaluate the deep venous system(s) from the level of the common femoral vein through the popliteal and proximal calf veins. COMPARISON:  None. FINDINGS: VENOUS Normal compressibility of the common femoral, superficial femoral, and popliteal veins, as well as the visualized calf veins. Visualized portions of profunda femoral vein and great saphenous vein unremarkable. No filling defects to suggest DVT on grayscale or color Doppler imaging. Doppler waveforms show normal direction of venous flow, normal respiratory plasticity and response to augmentation. Limited views of the contralateral common femoral vein are unremarkable. OTHER None. Limitations: none IMPRESSION: There is no evidence of DVT in the left lower extremity. Electronically Signed   By: Elmer Picker M.D.   On: 12/02/2020 15:32   ____________________________________________  PROCEDURES   Procedures ____________________________________________   INITIAL IMPRESSION / ASSESSMENT AND  PLAN / ED COURSE  As part of my medical decision making, I reviewed the following data within the [facility] reviewed WNL and Notes from prior ED visits   DDX: DVT, phlebitis, cellulitis, varicose veins  Patient with ED evaluation of the left leg pain superficially.  Patient with a history of varicose veins, presents with concern for DVT patient evaluated in the ED with a reassuring ultrasound.  Patient will follow with her primary provider or her vascular specialist for ongoing evaluation.  Return precautions of been reviewed.  Somya Jauregui was evaluated in Emergency Department on 12/02/2020 for the symptoms described in the history of present illness. She was evaluated in the context of the global COVID-19 pandemic, which necessitated consideration that the patient might be at risk for infection with the SARS-CoV-2 virus that causes COVID-19. Institutional protocols and algorithms that pertain to the evaluation of patients at risk for COVID-19 are in a state of rapid change based on information released by regulatory bodies including the CDC and federal and state organizations. These policies and algorithms were followed during the patient's care in the ED. ____________________________________________  FINAL CLINICAL IMPRESSION(S) / ED DIAGNOSES  Final diagnoses:  Left leg pain  Varicose veins of left lower extremity with pain      Carmie End, Dannielle Karvonen, PA-C 12/02/20 1727    Carrie Mew, MD 12/02/20 2336

## 2020-12-02 NOTE — ED Triage Notes (Signed)
Pt here with C/O pain in left calf for 2 days, and now is in left thigh. Pt did state she has had an issue with high D-Dimer and PVC's. Has a cardiologist but unable to get in contact with them.

## 2020-12-02 NOTE — ED Notes (Signed)
Patient is being discharged from the Urgent Care and sent to the Emergency Department via private vehicle.  Per A.White NP, patient is in need of higher level of care due to possible blood clot. Patient is aware and verbalizes understanding of plan of care.

## 2020-12-02 NOTE — Discharge Instructions (Signed)
Your DVT study was negative.  Follow-up with your primary provider or your vascular surgeon for ongoing management.

## 2020-12-02 NOTE — ED Triage Notes (Signed)
Pt to ED from UC for rule out DVT left leg. Pain started last night. Hx elevated D dimer Denies swelling or redness

## 2020-12-06 ENCOUNTER — Encounter: Payer: Self-pay | Admitting: Gastroenterology

## 2020-12-06 ENCOUNTER — Other Ambulatory Visit: Payer: Self-pay

## 2020-12-06 ENCOUNTER — Telehealth: Payer: Self-pay

## 2020-12-06 ENCOUNTER — Ambulatory Visit (INDEPENDENT_AMBULATORY_CARE_PROVIDER_SITE_OTHER): Payer: 59 | Admitting: Gastroenterology

## 2020-12-06 VITALS — BP 131/85 | HR 90 | Temp 98.8°F | Ht 62.0 in | Wt 150.0 lb

## 2020-12-06 DIAGNOSIS — R109 Unspecified abdominal pain: Secondary | ICD-10-CM | POA: Diagnosis not present

## 2020-12-06 MED ORDER — DICYCLOMINE HCL 10 MG PO CAPS: 10.0000 mg | ORAL_CAPSULE | Freq: Four times a day (QID) | ORAL | 0 refills | Status: AC | PRN

## 2020-12-06 NOTE — Progress Notes (Signed)
Cephas Darby, MD [address]  [place]  [place], [place] [address]  Main: [phone]  Fax: [phone]    Gastroenterology Consultation  Referring Provider:     Hoyt Koch, * Primary Care Physician:  Hoyt Koch, MD Primary Gastroenterologist:  Dr. Cephas Darby Reason for Consultation:   Abdominal pain       HPI:   Michelle Waller is a 30 y.o. female referred by Dr. Sharlet Salina, Real Cons, MD  for consultation & management of rectal pain started about 3 to 4 months ago.  Patient delivered in January 2022.  Since then, she has been experiencing intense sharp pain in her rectum with each bowel movement.  She does have history of chronic constipation, has worsened since her delivery.  Her stools are hard and lumpy.  She used to take Colace.  She does have a history of chronic heartburn, for which she is currently taking Pepcid.  Patient was taking Nexium during her pregnancy as it was worse.  Patient has been previously seen by Dr. Hilarie Fredrickson in [place].  She underwent upper endoscopy in May 2021, esophageal biopsies revealed reflux esophagitis, empirically dilated to 17 mm with savory.  She underwent colonoscopy in 2020 which was unremarkable.  She underwent upper endoscopy in 2019, gastric biopsies were normal. Patient's diet is devoid of fiber She does not smoke or drink alcohol She works as a Facilities manager She does have history of anxiety  Follow-up visit 09/09/2020 Patient is here for follow-up on her rectal pain.  She reports that her anal fissure has healed by itself.  She did not use nitroglycerin that was prescribed.  Her main concern today is rectal pressure, swelling in her rectum and protrusion of the hemorrhoid that spontaneously reduces after BM.  She also reports that her constipation has resolved.  Her acid reflux is also under control on omeprazole 40 mg daily.  Follow-up visit 12/06/2020 Patient is here to discuss about new onset of  periumbilical abdominal pain that started on Thursday.  She reports severe cramping, and she did have diarrhea on day one of her onset of abdominal pain.  Therefore, took Imodium and her diarrhea subsided.  She denies any sick contacts.  She has been taking Pepto-Bismol as needed for central abdominal pain.  Patient feels like her abdomen is severely inflamed.  She denies any nausea or vomiting.  She reports having formed bowel movements daily.  She does report that she has been eating more processed sugars lately.  She is no longer experiencing hemorrhoidal symptoms.  NSAIDs: None  Antiplts/Anticoagulants/Anti thrombotics: None  GI Procedures:  EGD 06/19/2019 Surgical [P], distal esophagus and proximal esophagus - ESOPHAGEAL SQUAMOUS MUCOSA WITH MILD VASCULAR CONGESTION, AND FOCAL SQUAMOUS BALLOONING, SUGGESTIVE OF REFLUX ESOPHAGITIS - NEGATIVE FOR INCREASED INTRAEPITHELIAL EOSINOPHILS - White specked mucosa in the esophagus. Biopsied. - 2 cm hiatal hernia. - No endoscopic esophageal abnormality such as stricture or ring to explain patient's dysphagia. Esophagus dilated with 17 mm Savary over a guidewire. - Normal stomach. - Normal examined duodenum.  Colonoscopy 03/19/2018 - The digital rectal exam was normal. - The terminal ileum appeared normal. - The entire examined colon appeared normal on direct and retroflexion views. - Biopsies for histology were taken with a cold forceps from the right colon, left colon and rectum for evaluation of microscopic colitis. Diagnosis 1. Surgical [P], right colon - COLONIC MUCOSA WITH NO SPECIFIC HISTOPATHOLOGIC CHANGES - NEGATIVE FOR ACUTE INFLAMMATION, FEATURES OF CHRONICITY, INCREASED INTRAEPITHELIAL  LYMPHOCYTES OR THICKENED SUBEPITHELIAL COLLAGEN TABLE 2. Surgical [P], left colon - COLONIC MUCOSA WITH NO SPECIFIC HISTOPATHOLOGIC CHANGES - NEGATIVE FOR ACUTE INFLAMMATION, FEATURES OF CHRONICITY, INCREASED INTRAEPITHELIAL LYMPHOCYTES OR THICKENED  SUBEPITHELIAL COLLAGEN TABLE  Upper endoscopy 02/01/2017 - Normal mucosa was found in the entire esophagus. - A 2 cm hiatal hernia was present. The GE junction is patulous. - Segmental mild inflammation characterized by erosions and erythema was found in the gastric body. Biopsies were taken with a cold forceps for histology. - The examined duodenum was normal. Surgical [P], gastric antrum and gastric body - GASTRIC ANTRAL AND OXYNTIC MUCOSA WITH NO SPECIFIC HISTOPATHOLOGIC CHANGES. - WARTHIN-STARRY STAIN IS NEGATIVE FOR HELICOBACTER PYLORI.  Past Medical History:  Diagnosis Date   Allergy    Anemia    Anxiety    Asthma    no inhalers   Eating disorder    Family history of adverse reaction to anesthesia    mom-n/v   Frequent headaches    Gastritis    GERD (gastroesophageal reflux disease)    Hiatal hernia    Hypothyroid    IBS (irritable bowel syndrome)    Kidney stones    Obsessive compulsive disorder    Ovarian cyst    Pregnancy induced hypertension    Sjogren's disease ([facility])    Sjogren's syndrome ([facility])    Thyroid disease    UTI (urinary tract infection)    Vaginal Pap smear, abnormal    Vision abnormalities     Past Surgical History:  Procedure Laterality Date   ADENOIDECTOMY     CERVIX LESION DESTRUCTION     COLONOSCOPY     x2   CYSTOSCOPY     ESOPHAGOGASTRODUODENOSCOPY     x3   KIDNEY STONE SURGERY     KNEE ARTHROSCOPY Right    KNEE SURGERY       Current Outpatient Medications:    dicyclomine (BENTYL) 10 MG capsule, Take 1 capsule (10 mg total) by mouth every 6 (six) hours as needed for up to 15 doses for spasms., Disp: 15 capsule, Rfl: 0   escitalopram (LEXAPRO) 5 MG tablet, Take 1 tablet (5 mg total) by mouth every evening., Disp: 90 tablet, Rfl: 3   levothyroxine (SYNTHROID) 50 MCG tablet, Take 1 tablet (50 mcg total) by mouth daily before breakfast., Disp: 90 tablet, Rfl: 3   Multiple Vitamins-Minerals (ADULT GUMMY) CHEW, Chew 2 capsules by mouth  daily., Disp: , Rfl:    Omeprazole 20 MG TBEC, Take 20 mg by mouth daily at 2 PM., Disp: , Rfl:    Probiotic CHEW, Chew 2 capsules by mouth daily., Disp: , Rfl:    Family History  Problem Relation Age of Onset   Thyroid disease Mother    Hypertension Father    Diabetes Father    Thyroid disease Sister    Cancer Paternal Grandmother        breast   Diabetes Paternal Grandfather    Heart disease Paternal Grandfather    Depression Mother    Atrial fibrillation Mother    Colon polyps Father    Arthritis Maternal Grandmother    Heart disease Maternal Grandmother    Hypertension Paternal Grandmother    Hypertension Paternal Grandfather    Diabetes Mellitus II Sister    Hypothyroidism Sister    Colon cancer Neg Hx    Esophageal cancer Neg Hx    Rectal cancer Neg Hx    Stomach cancer Neg Hx      Social History  Tobacco Use   Smoking status: Former    Packs/day: 0.25    Years: 1.00    Pack years: 0.25    Types: Cigarettes    Quit date: 11/09/2011    Years since quitting: 9.0   Smokeless tobacco: Never  Vaping Use   Vaping Use: Never used  Substance Use Topics   Alcohol use: Not Currently    Comment: occasionally    Drug use: Never    Allergies as of 12/06/2020 - Review Complete 12/06/2020  Allergen Reaction Noted   Soy allergy Anaphylaxis 09/23/2013   Methylprednisolone Hives and Swelling 11/27/2019   Montelukast  02/27/2020    Review of Systems:    All systems reviewed and negative except where noted in HPI.   Physical Exam:  BP 131/85 (BP Location: Right Arm, Patient Position: Sitting, Cuff Size: Normal)   Pulse 90   Temp 98.8 F (37.1 C) (Oral)   Ht 5\' 2"  (1.575 m)   Wt 150 lb (68 kg)   LMP 12/02/2020   BMI 27.44 kg/m  Patient's last menstrual period was 12/02/2020.  General:   Alert,  Well-developed, well-nourished, pleasant and cooperative in NAD Head:  Normocephalic and atraumatic. Eyes:  Sclera clear, no icterus.   Conjunctiva pink. Ears:   Normal auditory acuity. Nose:  No deformity, discharge, or lesions. Mouth:  No deformity or lesions,oropharynx pink & moist. Neck:  Supple; no masses or thyromegaly. Lungs:  Respirations even and unlabored.  Clear throughout to auscultation.   No wheezes, crackles, or rhonchi. No acute distress. Heart:  Regular rate and rhythm; no murmurs, clicks, rubs, or gallops. Abdomen:  Normal bowel sounds. Soft, non-tender and non-distended without masses, hepatosplenomegaly or hernias noted.  No guarding or rebound tenderness.   Rectal: Not performed Msk:  Symmetrical without gross deformities. Good, equal movement & strength bilaterally. Pulses:  Normal pulses noted. Extremities:  No clubbing or edema.  No cyanosis. Neurologic:  Alert and oriented x3;  grossly normal neurologically. Skin:  Intact without significant lesions or rashes. No jaundice. Psych:  Alert and cooperative. Normal mood and affect.  Imaging Studies: Reviewed  Assessment and Plan:   Michelle Waller is a 30 y.o. pleasant female with history of anal fissure, chronic constipation, chronic GERD.  The symptoms have currently resolved.  Patient has history of grade 2 symptomatic external hemorrhoids s/p ligation of RP hemorrhoid.  Patient made a follow-up to discuss about 1 week history of central abdominal pain.  She also had 1 day history of nonbloody diarrhea which has resolved.  No alarm symptoms including fever, chills, nausea or vomiting or diarrhea to be concerned about acute appendicitis or electrolyte abnormalities.  I do not recommend CT scan today unless her symptoms worsen in next 24 to 48 hours.  Advised patient to completely stop the processed foods and try liquid diet to soft foods only.  Told her that Pepto-Bismol can lead to constipation.  She can also try Bentyl as needed, cautioned her about constipation with Bentyl as well  Patient will keep me updated via MyChart   Follow up as needed   Cephas Darby, MD

## 2020-12-06 NOTE — Telephone Encounter (Signed)
Called patient to offer her an appointment in reference to her MyChart message as copied below:  I have been recently been feeling many extra beats an hour. They're causing a sensation of a skipped beat in my chest. Of course they're making me aware of my heartbeat.  I know at a small value these aren't a danger but they're becoming quite frequent and I have been having them daily.  Is this something to follow up with you on or my PCP?   Scheduled patient to be seen on 12/09/20. Patient was grateful for the follow up.

## 2020-12-07 ENCOUNTER — Other Ambulatory Visit: Payer: Self-pay

## 2020-12-07 ENCOUNTER — Ambulatory Visit
Admission: EM | Admit: 2020-12-07 | Discharge: 2020-12-07 | Disposition: A | Discharge status: Home / Self Care | Payer: 59

## 2020-12-07 DIAGNOSIS — G629 Polyneuropathy, unspecified: Secondary | ICD-10-CM

## 2020-12-07 NOTE — ED Provider Notes (Signed)
[facility]    CSN: [id number] Arrival date & time: 12/07/20  1755      History   Chief Complaint Chief Complaint  Patient presents with   Foot Pain    HPI Michelle Waller is a 30 y.o. female.   HPI  30 year old female here for evaluation of burning to both feet.  Patient reports that she is been experiencing burning to her whole feet that occasionally will come up into her calves for the past 6 months.  The burning is off and on and will sometimes last for hours at a time.  She has contacted her primary care doctor about this and had blood work drawn.  They drew a vitamin D level which she is very low in and also vitamin B12.  Patient is concerned that she might have diabetes.  She states that she also has Sjogren's disease and is concerned it might be a result of an autoimmune disorder.  She reports that it does improve when she stands or wears compression socks.  She states that her symptoms have been worse today but she has not worn compression today.  Past Medical History:  Diagnosis Date   Allergy    Anemia    Anxiety    Asthma    no inhalers   Eating disorder    Family history of adverse reaction to anesthesia    mom-n/v   Frequent headaches    Gastritis    GERD (gastroesophageal reflux disease)    Hiatal hernia    Hypothyroid    IBS (irritable bowel syndrome)    Kidney stones    Obsessive compulsive disorder    Ovarian cyst    Pregnancy induced hypertension    Sjogren's disease ([place])    Sjogren's syndrome (H[facility])    Thyroid disease    UTI (urinary tract infection)    Vaginal Pap smear, abnormal    Vision abnormalities     Patient Active Problem List   Diagnosis Date Noted   Snoring 07/01/2020   Blood pressure alteration 03/05/2020   Seasonal and perennial allergic rhinitis 04/16/2019   Palpitations 01/15/2019   Nonallopathic lesion of lumbosacral region 10/02/2017   Nonallopathic lesion of sacral region 10/02/2017   Irritable bowel  syndrome 06/21/2017   Routine general medical examination at a health care facility 06/14/2017   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   Nonallopathic lesion of thoracic region 04/30/2017   Nonallopathic lesion of rib cage 04/30/2017   Nonallopathic lesion of cervical region 04/30/2017   Chest pain 01/31/2017   ANA positive 10/31/2016   Anxiety 08/15/2016   Hypothyroidism 02/03/2016    Past Surgical History:  Procedure Laterality Date   ADENOIDECTOMY     CERVIX LESION DESTRUCTION     COLONOSCOPY     x2   CYSTOSCOPY     ESOPHAGOGASTRODUODENOSCOPY     x3   KIDNEY STONE SURGERY     KNEE ARTHROSCOPY Right    KNEE SURGERY      OB History     Gravida  1   Para  1   Term  1   Preterm  0   AB  0   Living  1      SAB  0   IAB  0   Ectopic  0   Multiple  0   Live Births  1            Home Medications    Prior to Admission medications   Medication Sig Start  Date End Date Taking? Authorizing Provider  cholecalciferol (VITAMIN D3) 25 MCG (1000 UNIT) tablet  11/23/20  Yes [provider]  dicyclomine (BENTYL) 10 MG capsule Take 1 capsule (10 mg total) by mouth every 6 (six) hours as needed for up to 15 doses for spasms. 12/06/20  Yes Vanga, Tally Due, MD  escitalopram (LEXAPRO) 5 MG tablet Take 1 tablet (5 mg total) by mouth every evening. 10/07/20  Yes Hoyt Koch, MD  Iron-Vitamin C 100-250 MG TABS  11/24/20  Yes [provider]  levothyroxine (SYNTHROID) 50 MCG tablet Take 1 tablet (50 mcg total) by mouth daily before breakfast. 03/30/20  Yes Hoyt Koch, MD  Multiple Vitamins-Minerals (ADULT GUMMY) CHEW Chew 2 capsules by mouth daily.   Yes [provider]  Omeprazole 20 MG TBEC Take 20 mg by mouth daily at 2 PM.   Yes [provider]  Probiotic CHEW Chew 2 capsules by mouth daily.   Yes [provider]  albuterol (PROVENTIL HFA;VENTOLIN HFA) 108 (90 Base) MCG/ACT inhaler Inhale 2 puffs into the lungs  every 4 (four) hours as needed for wheezing or shortness of breath. 11/19/17 07/05/19  Lyndal Pulley, DO  Fluticasone Propionate Truett Perna) 93 MCG/ACT EXHU Place 32 sprays into the nose in the morning and at bedtime. 05/27/19 07/05/19  Valentina Shaggy, MD  sucralfate (CARAFATE) 1 g tablet Take 1 tablet (1 g total) by mouth 4 (four) times daily -  with meals and at bedtime. 06/20/19 07/05/19  Mauri Pole, MD    Family History Family History  Problem Relation Age of Onset   Thyroid disease Mother    Hypertension Father    Diabetes Father    Thyroid disease Sister    Cancer Paternal Grandmother        breast   Diabetes Paternal Grandfather    Heart disease Paternal Grandfather    Depression Mother    Atrial fibrillation Mother    Colon polyps Father    Arthritis Maternal Grandmother    Heart disease Maternal Grandmother    Hypertension Paternal Grandmother    Hypertension Paternal Grandfather    Diabetes Mellitus II Sister    Hypothyroidism Sister    Colon cancer Neg Hx    Esophageal cancer Neg Hx    Rectal cancer Neg Hx    Stomach cancer Neg Hx     Social History Social History   Tobacco Use   Smoking status: Former    Packs/day: 0.25    Years: 1.00    Pack years: 0.25    Types: Cigarettes    Quit date: 11/09/2011    Years since quitting: 9.0   Smokeless tobacco: Never  Vaping Use   Vaping Use: Never used  Substance Use Topics   Alcohol use: Not Currently    Comment: occasionally    Drug use: Never     Allergies   Soy allergy, Methylprednisolone, and Montelukast   Review of Systems Review of Systems  Constitutional:  Negative for activity change, appetite change and fever.  Musculoskeletal:  Positive for myalgias. Negative for arthralgias.  Skin:  Negative for rash.  Neurological:  Negative for weakness and numbness.  Hematological: Negative.   Psychiatric/Behavioral: Negative.      Physical Exam Triage Vital Signs ED Triage Vitals  Enc  Vitals Group     BP 12/07/20 1846 121/83     Pulse Rate 12/07/20 1846 65     Resp 12/07/20 1846 18     Temp  12/07/20 1846 98.3 F (36.8 C)     Temp src --      SpO2 12/07/20 1846 98 %     Weight 12/07/20 1844 150 lb (68 kg)     Height 12/07/20 1844 5\' 2"  (1.575 m)     Head Circumference --      Peak Flow --      Pain Score --      Pain Loc --      Pain Edu? --      Excl. in [place]? --    No data found.  Updated Vital Signs BP 121/83   Pulse 65   Temp 98.3 F (36.8 C)   Resp 18   Ht 5\' 2"  (1.575 m)   Wt 150 lb (68 kg)   LMP 12/02/2020   SpO2 98%   BMI 27.44 kg/m   Visual Acuity Right Eye Distance:   Left Eye Distance:   Bilateral Distance:    Right Eye Near:   Left Eye Near:    Bilateral Near:     Physical Exam Vitals and nursing note reviewed.  Constitutional:      General: She is not in acute distress.    Appearance: Normal appearance. She is normal weight. She is not ill-appearing.  HENT:     Head: Normocephalic and atraumatic.  Musculoskeletal:        General: No swelling or tenderness. Normal range of motion.  Skin:    General: Skin is warm and dry.     Capillary Refill: Capillary refill takes less than 2 seconds.     Findings: No erythema or rash.  Neurological:     General: No focal deficit present.     Mental Status: She is alert and oriented to person, place, and time.     Sensory: Sensory deficit present.  Psychiatric:        Mood and Affect: Mood normal.        Behavior: Behavior normal.        Thought Content: Thought content normal.        Judgment: Judgment normal.     UC Treatments / Results  Labs (all labs ordered are listed, but only abnormal results are displayed) Labs Reviewed - No data to display  EKG   Radiology No results found.  Procedures Procedures (including critical care time)  Medications Ordered in UC Medications - No data to display  Initial Impression / Assessment and Plan / UC Course  I have reviewed the  triage vital signs and the nursing notes.  Pertinent labs & imaging results that were available during my care of the patient were reviewed by me and considered in my medical decision making (see chart for details).  Patient is here for evaluation of burning in both of her feet that been going on for the past 6 months.  She has reached out to her primary care provider but does not feel like it is being adequately addressed and she is here requesting blood work Midwife.  Recently had a vitamin D level and vitamin B12 level drawn by her PCP to address this issue and was found that her vitamin D level was markedly low at 28.93 her vitamin B 12 level was slightly elevated at 989.  She is concerned that it may be diabetes.  But her blood sugar has been normal except on 2 occasions when it was over 120.  She does not have a history of diabetes.  Physical exam reveals no gross  abnormalities to the patient's feet.  Bilateral peripheral pulses are 2+ in both feet.  Cap refill in her toes is approximately 3 seconds but they are cold due to the weather.  The warmer section of her arch her cap refill is 2 seconds.  Patient does have some difficulty with two-point discrimination on the sole of her foot.  She does have full gross sensation and gross motor in both feet.  The source of her burning is unclear.  We discussed that it could be a result of B vitamin deficiencies, microvascular issues since standing and wearing compression pression garments helps, or neurologic.  I have advised her to try taking a B complex multivitamin and alpha lipoic acid supplement daily to see if this helps her symptoms.  Of also advised her to obtain new compression socks and go up 1 level of compression as hers are over 23 years old to see if this helps her symptoms.  I have also given her referral information for the [facility] [place] whom she can reach out to for further work-up as needed.   Final Clinical  Impressions(s) / UC Diagnoses   Final diagnoses:  Neuropathy     Discharge Instructions      Purchased new compression socks and got 1 level of compression to see if this helps her symptoms as sometimes neuropathy can be a result of microvascular circulation issues.  You can also try wearing compression tights.  Start taking a B complex and also alpha lipoic acid vitamin supplements to see if this helps with your symptoms.  I would follow back up with your primary care provider to ask for more blood work to assess all of your B vitamin levels or for referral to neurology for evaluation of neurologic component given that you have a deficit in two-point discrimination in your feet.     ED Prescriptions   None    PDMP not reviewed this encounter.   Margarette Canada, NP 12/07/20 1936

## 2020-12-07 NOTE — ED Triage Notes (Signed)
Pt here with C/O bottom of feet burning for months ago. PCP has done blood work with no answer.

## 2020-12-07 NOTE — Discharge Instructions (Addendum)
Purchased new compression socks and got 1 level of compression to see if this helps her symptoms as sometimes neuropathy can be a result of microvascular circulation issues.  You can also try wearing compression tights.  Start taking a B complex and also alpha lipoic acid vitamin supplements to see if this helps with your symptoms.  I would follow back up with your primary care provider to ask for more blood work to assess all of your B vitamin levels or for referral to neurology for evaluation of neurologic component given that you have a deficit in two-point discrimination in your feet.

## 2020-12-08 ENCOUNTER — Telehealth: Payer: Self-pay

## 2020-12-08 NOTE — Telephone Encounter (Signed)
Opened in error

## 2020-12-09 ENCOUNTER — Ambulatory Visit: Payer: 59 | Admitting: Cardiology

## 2020-12-16 ENCOUNTER — Emergency Department
Admission: EM | Admit: 2020-12-16 | Discharge: 2020-12-16 | Disposition: A | Discharge status: Home / Self Care | Payer: 59 | Attending: Physician Assistant | Admitting: Physician Assistant

## 2020-12-16 ENCOUNTER — Encounter: Payer: Self-pay | Admitting: Intensive Care

## 2020-12-16 ENCOUNTER — Other Ambulatory Visit: Payer: Self-pay

## 2020-12-16 DIAGNOSIS — Z87891 Personal history of nicotine dependence: Secondary | ICD-10-CM | POA: Diagnosis not present

## 2020-12-16 DIAGNOSIS — Z79899 Other long term (current) drug therapy: Secondary | ICD-10-CM | POA: Diagnosis not present

## 2020-12-16 DIAGNOSIS — Z20822 Contact with and (suspected) exposure to covid-19: Secondary | ICD-10-CM | POA: Diagnosis not present

## 2020-12-16 DIAGNOSIS — E039 Hypothyroidism, unspecified: Secondary | ICD-10-CM | POA: Insufficient documentation

## 2020-12-16 DIAGNOSIS — R0981 Nasal congestion: Secondary | ICD-10-CM | POA: Diagnosis present

## 2020-12-16 DIAGNOSIS — Z7952 Long term (current) use of systemic steroids: Secondary | ICD-10-CM | POA: Insufficient documentation

## 2020-12-16 DIAGNOSIS — J069 Acute upper respiratory infection, unspecified: Secondary | ICD-10-CM | POA: Insufficient documentation

## 2020-12-16 DIAGNOSIS — J45909 Unspecified asthma, uncomplicated: Secondary | ICD-10-CM | POA: Diagnosis not present

## 2020-12-16 LAB — RESP PANEL BY RT-PCR (FLU A&B, COVID) ARPGX2
Influenza A by PCR: NEGATIVE
Influenza B by PCR: NEGATIVE
SARS Coronavirus 2 by RT PCR: NEGATIVE

## 2020-12-16 NOTE — ED Provider Notes (Signed)
[facility]  ____________________________________________  Time seen: Approximately 9:50 PM  I have reviewed the triage vital signs and the nursing notes.   HISTORY  Chief Complaint Nasal Congestion and Headache   Historian Patient     HPI Michelle Waller is a 30 y.o. female presents to the emergency department with nasal congestion, headache and rhinorrhea.  Patient's 60-month-old daughter has similar symptoms at home.  No chest pain, chest tightness or abdominal pain.  Patient has had low-grade fever.   Past Medical History:  Diagnosis Date   Allergy    Anemia    Anxiety    Asthma    no inhalers   Eating disorder    Family history of adverse reaction to anesthesia    mom-n/v   Frequent headaches    Gastritis    GERD (gastroesophageal reflux disease)    Hiatal hernia    Hypothyroid    IBS (irritable bowel syndrome)    Kidney stones    Obsessive compulsive disorder    Ovarian cyst    Pregnancy induced hypertension    Sjogren's disease ([facility])    Sjogren's syndrome ([facility])    Thyroid disease    UTI (urinary tract infection)    Vaginal Pap smear, abnormal    Vision abnormalities      Immunizations up to date:  Yes.     Past Medical History:  Diagnosis Date   Allergy    Anemia    Anxiety    Asthma    no inhalers   Eating disorder    Family history of adverse reaction to anesthesia    mom-n/v   Frequent headaches    Gastritis    GERD (gastroesophageal reflux disease)    Hiatal hernia    Hypothyroid    IBS (irritable bowel syndrome)    Kidney stones    Obsessive compulsive disorder    Ovarian cyst    Pregnancy induced hypertension    Sjogren's disease ([place])    Sjogren's syndrome ([facility])    Thyroid disease    UTI (urinary tract infection)    Vaginal Pap smear, abnormal    Vision abnormalities     Patient Active Problem List   Diagnosis Date Noted   Snoring 07/01/2020   Blood pressure alteration 03/05/2020   Seasonal and  perennial allergic rhinitis 04/16/2019   Palpitations 01/15/2019   Nonallopathic lesion of lumbosacral region 10/02/2017   Nonallopathic lesion of sacral region 10/02/2017   Irritable bowel syndrome 06/21/2017   Routine general medical examination at a health care facility 06/14/2017   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 Authorizing Provider  cholecalciferol (VITAMIN D3) 25 MCG (1000 UNIT) tablet  11/23/20   [provider]  dicyclomine (BENTYL) 10 MG capsule Take 1 capsule (10 mg total) by mouth every 6 (six) hours as needed  for up to 15 doses for spasms. 12/06/20   Lin Landsman, MD  escitalopram (LEXAPRO) 5 MG tablet Take 1 tablet (5 mg total) by mouth every evening. 10/07/20   Hoyt Koch, MD  Iron-Vitamin C 100-250 MG TABS  11/24/20   [provider]  levothyroxine (SYNTHROID) 50 MCG tablet Take 1 tablet (50 mcg total) by mouth daily before breakfast. 03/30/20   Hoyt Koch, MD  Multiple Vitamins-Minerals (ADULT GUMMY) CHEW Chew 2 capsules by mouth daily.    [provider]  Omeprazole 20 MG TBEC Take 20 mg by mouth daily at 2 PM.    [provider]  Probiotic CHEW Chew 2 capsules by mouth daily.    [provider]  albuterol (PROVENTIL HFA;VENTOLIN HFA) 108 (90 Base) MCG/ACT inhaler Inhale 2 puffs into the lungs every 4 (four) hours as needed for  wheezing or shortness of breath. 11/19/17 07/05/19  Lyndal Pulley, DO  Fluticasone Propionate Truett Perna) 93 MCG/ACT EXHU Place 32 sprays into the nose in the morning and at bedtime. 05/27/19 07/05/19  Valentina Shaggy, MD  sucralfate (CARAFATE) 1 g tablet Take 1 tablet (1 g total) by mouth 4 (four) times daily -  with meals and at bedtime. 06/20/19 07/05/19  Mauri Pole, MD    Allergies Soy allergy, Methylprednisolone, and Montelukast  Family History  Problem Relation Age of Onset   Thyroid disease Mother    Hypertension Father    Diabetes Father    Thyroid disease Sister    Cancer Paternal Grandmother        breast   Diabetes Paternal Grandfather    Heart disease Paternal Grandfather    Depression Mother    Atrial fibrillation Mother    Colon polyps Father    Arthritis Maternal Grandmother    Heart disease Maternal Grandmother    Hypertension Paternal Grandmother    Hypertension Paternal Grandfather    Diabetes Mellitus II Sister    Hypothyroidism Sister    Colon cancer Neg Hx    Esophageal cancer Neg Hx    Rectal cancer Neg Hx    Stomach cancer Neg Hx     Social History Social History   Tobacco Use   Smoking status: Former    Packs/day: 0.25    Years: 1.00    Pack years: 0.25    Types: Cigarettes    Quit date: 11/09/2011    Years since quitting: 9.1   Smokeless tobacco: Never  Vaping Use   Vaping Use: Never used  Substance Use Topics   Alcohol use: Not Currently    Comment: occasionally    Drug use: Never      Review of Systems  Constitutional: Patient has fever.  Eyes: No visual changes. No discharge ENT: Patient has congestion.  Cardiovascular: no chest pain. Respiratory: Patient has cough.  Gastrointestinal: No abdominal pain.  No nausea, no vomiting. Patient had diarrhea.  Genitourinary: Negative for dysuria. No hematuria Musculoskeletal: Patient has myalgias.  Skin: Negative for rash, abrasions, lacerations, ecchymosis. Neurological:  Patient has headache, no focal weakness or numbness.    ____________________________________________   PHYSICAL EXAM:  VITAL SIGNS: ED Triage Vitals  Enc Vitals Group     BP 12/16/20 1749 115/89     Pulse Rate 12/16/20 1749 97     Resp 12/16/20 1749 18     Temp 12/16/20 1749 99 F (37.2 C)     Temp Source 12/16/20 1749 Oral     SpO2 12/16/20 1749 98 %  Weight 12/16/20 1800 146 lb (66.2 kg)     Height 12/16/20 1800 5\' 2"  (1.575 m)     Head Circumference --      Peak Flow --      Pain Score 12/16/20 1800 4     Pain Loc --      Pain Edu? --      Excl. in [place]? --      Constitutional: Alert and oriented. Patient is lying supine. Eyes: Conjunctivae are normal. PERRL. EOMI. Head: Atraumatic. ENT:      Ears: Tympanic membranes are mildly injected with mild effusion bilaterally.       Nose: No congestion/rhinnorhea.      Mouth/Throat: Mucous membranes are moist. Posterior pharynx is mildly erythematous.  Hematological/Lymphatic/Immunilogical: No cervical lymphadenopathy.  Cardiovascular: Normal rate, regular rhythm. Normal S1 and S2.  Good peripheral circulation. Respiratory: Normal respiratory effort without tachypnea or retractions. Lungs CTAB. Good air entry to the bases with no decreased or absent breath sounds. Gastrointestinal: Bowel sounds 4 quadrants. Soft and nontender to palpation. No guarding or rigidity. No palpable masses. No distention. No CVA tenderness. Musculoskeletal: Full range of motion to all extremities. No gross deformities appreciated. Neurologic:  Normal speech and language. No gross focal neurologic deficits are appreciated.  Skin:  Skin is warm, dry and intact. No rash noted. Psychiatric: Mood and affect are normal. Speech and behavior are normal. Patient exhibits appropriate insight and judgement.   ____________________________________________   LABS (all labs ordered are listed, but only abnormal results are displayed)  Labs Reviewed  RESP  PANEL BY RT-PCR (FLU A&B, COVID) [id number]   ____________________________________________  EKG   ____________________________________________  RADIOLOGY   No results found.  ____________________________________________    PROCEDURES  Procedure(s) performed:     Procedures     Medications - No data to display   ____________________________________________   INITIAL IMPRESSION / ASSESSMENT AND PLAN / ED COURSE  Pertinent labs & imaging results that were available during my care of the patient were reviewed by me and considered in my medical decision making (see chart for details).      Assessment and plan Viral upper respiratory tract infection 30 year old female presents to the emergency department with nasal congestion, rhinorrhea and nonproductive cough for the past 2 to 3 days unspecified viral URI is likely at this time.  Rest and hydration were encouraged at home.  Tylenol and ibuprofen alternating were recommended for fever if fever occurs     ____________________________________________  FINAL CLINICAL IMPRESSION(S) / ED DIAGNOSES  Final diagnoses:  Viral upper respiratory tract infection      NEW MEDICATIONS STARTED DURING THIS VISIT:  ED Discharge Orders     None           This chart was dictated using voice recognition software/Dragon. Despite best efforts to proofread, errors can occur which can change the meaning. Any change was purely unintentional.     Lannie Fields, PA-C 12/16/20 2151    Vanessa Napili-[place], MD 12/17/20 [phone]

## 2020-12-16 NOTE — ED Triage Notes (Signed)
Presents with chest congestion, headache, and headache

## 2020-12-20 ENCOUNTER — Encounter: Payer: Self-pay | Admitting: Internal Medicine

## 2020-12-20 ENCOUNTER — Encounter: Payer: Self-pay | Admitting: Obstetrics

## 2021-01-03 ENCOUNTER — Encounter: Payer: Self-pay | Admitting: Internal Medicine

## 2021-01-03 MED ORDER — LORAZEPAM 0.5 MG PO TABS: 0.5000 mg | ORAL_TABLET | Freq: Every day | ORAL | 0 refills | Status: AC | PRN

## 2021-01-17 ENCOUNTER — Encounter: Payer: Self-pay | Admitting: Internal Medicine

## 2021-01-21 MED ORDER — LEVOTHYROXINE SODIUM 25 MCG PO TABS: 25.0000 ug | ORAL_TABLET | Freq: Every day | ORAL | 1 refills | Status: AC

## 2021-02-20 ENCOUNTER — Emergency Department
Admission: EM | Admit: 2021-02-20 | Discharge: 2021-02-20 | Disposition: A | Discharge status: Home / Self Care | Payer: 59 | Attending: Emergency Medicine | Admitting: Emergency Medicine

## 2021-02-20 ENCOUNTER — Emergency Department: Payer: 59

## 2021-02-20 ENCOUNTER — Encounter: Payer: Self-pay | Admitting: Emergency Medicine

## 2021-02-20 ENCOUNTER — Other Ambulatory Visit: Payer: Self-pay

## 2021-02-20 DIAGNOSIS — J45909 Unspecified asthma, uncomplicated: Secondary | ICD-10-CM | POA: Insufficient documentation

## 2021-02-20 DIAGNOSIS — M25512 Pain in left shoulder: Secondary | ICD-10-CM

## 2021-02-20 DIAGNOSIS — E039 Hypothyroidism, unspecified: Secondary | ICD-10-CM | POA: Insufficient documentation

## 2021-02-20 DIAGNOSIS — M5412 Radiculopathy, cervical region: Secondary | ICD-10-CM | POA: Diagnosis not present

## 2021-02-20 DIAGNOSIS — R0781 Pleurodynia: Secondary | ICD-10-CM | POA: Insufficient documentation

## 2021-02-20 LAB — POC URINE PREG, ED: Preg Test, Ur: NEGATIVE

## 2021-02-20 LAB — CBC WITH DIFFERENTIAL/PLATELET
Abs Immature Granulocytes: 0.01 10*3/uL (ref 0.00–0.07)
Basophils Absolute: 0 10*3/uL (ref 0.0–0.1)
Basophils Relative: 1 %
Eosinophils Absolute: 0.1 10*3/uL (ref 0.0–0.5)
Eosinophils Relative: 1 %
HCT: 38.8 % (ref 36.0–46.0)
Hemoglobin: 13 g/dL (ref 12.0–15.0)
Immature Granulocytes: 0 %
Lymphocytes Relative: 30 %
Lymphs Abs: 1.1 10*3/uL (ref 0.7–4.0)
MCH: 30.3 pg (ref 26.0–34.0)
MCHC: 33.5 g/dL (ref 30.0–36.0)
MCV: 90.4 fL (ref 80.0–100.0)
Monocytes Absolute: 0.4 10*3/uL (ref 0.1–1.0)
Monocytes Relative: 12 %
Neutro Abs: 2 10*3/uL (ref 1.7–7.7)
Neutrophils Relative %: 56 %
Platelets: 204 10*3/uL (ref 150–400)
RBC: 4.29 MIL/uL (ref 3.87–5.11)
RDW: 12 % (ref 11.5–15.5)
WBC: 3.6 10*3/uL — ABNORMAL LOW (ref 4.0–10.5)
nRBC: 0 % (ref 0.0–0.2)

## 2021-02-20 LAB — COMPREHENSIVE METABOLIC PANEL
ALT: 21 U/L (ref 0–44)
AST: 26 U/L (ref 15–41)
Albumin: 4.2 g/dL (ref 3.5–5.0)
Alkaline Phosphatase: 63 U/L (ref 38–126)
Anion gap: 6 (ref 5–15)
BUN: 8 mg/dL (ref 6–20)
CO2: 28 mmol/L (ref 22–32)
Calcium: 9.4 mg/dL (ref 8.9–10.3)
Chloride: 105 mmol/L (ref 98–111)
Creatinine, Ser: 0.61 mg/dL (ref 0.44–1.00)
GFR, Estimated: >60 mL/min (ref >60)
Glucose, Bld: 88 mg/dL (ref 70–99)
Potassium: 4.2 mmol/L (ref 3.5–5.1)
Sodium: 139 mmol/L (ref 135–145)
Total Bilirubin: 0.5 mg/dL (ref 0.3–1.2)
Total Protein: 7.4 g/dL (ref 6.5–8.1)

## 2021-02-20 LAB — URINALYSIS, ROUTINE W REFLEX MICROSCOPIC
Bilirubin Urine: NEGATIVE
Glucose, UA: NEGATIVE mg/dL
Ketones, ur: NEGATIVE mg/dL
Leukocytes,Ua: NEGATIVE
Nitrite: NEGATIVE
Protein, ur: NEGATIVE mg/dL
Specific Gravity, Urine: 1.015 (ref 1.005–1.030)
pH: 7 (ref 5.0–8.0)

## 2021-02-20 LAB — URINALYSIS, MICROSCOPIC (REFLEX)

## 2021-02-20 LAB — TROPONIN I (HIGH SENSITIVITY): Troponin I (High Sensitivity): <2 ng/L (ref <18)

## 2021-02-20 MED ORDER — NAPROXEN 500 MG PO TABS: 500.0000 mg | ORAL_TABLET | Freq: Two times a day (BID) | ORAL | 0 refills | Status: AC

## 2021-02-20 MED ORDER — METHOCARBAMOL 500 MG PO TABS: 500.0000 mg | ORAL_TABLET | Freq: Three times a day (TID) | ORAL | 0 refills | Status: AC | PRN

## 2021-02-20 NOTE — ED Triage Notes (Signed)
Pt reports severe left shoulder pain. Pt reports has some neuropathy issues going on. Pt also reports a swooshing noise in her ears. Pt reports pain goes all the way down her left arm and into her neck.

## 2021-02-20 NOTE — ED Notes (Signed)
Pt teaching provided on medications that may cause drowsiness. Pt instructed not to drive or operate heavy machinery while taking the prescribed medication. Pt verbalized understanding.  ? ?Pt provided discharge instructions and prescription information. Pt was given the opportunity to ask questions and questions were answered. Discharge signature not obtained in the setting of the COVID-19 pandemic in order to reduce high touch surfaces.  ? ?

## 2021-02-20 NOTE — Discharge Instructions (Signed)
Call make an appoint with Dr. Lovie Macadamia for follow-up if you continue to have any problems.  The muscle relaxant that was sent to the pharmacy is every 8 hours if you need it for muscle spasms or to help you sleep at night so that you can get comfortable.  Do not take the methocarbamol if you plan on driving or operating machinery.  Also a you can try propping your left arm up on a pillow which will help take some of the strain off your neck on that side.  Naproxen is twice a day with food.  This medication does not cause drowsiness.  You may also use ice or heat to your neck and shoulder as needed for discomfort.  Your EKG and lab work does not show anything suspicious for heart problems.  As we discussed there is some blood in your urine which most likely is from your kidney stones.  Return to the emergency department if any severe worsening of your symptoms such as difficulty breathing or shortness of breath.

## 2021-02-20 NOTE — ED Provider Notes (Signed)
[facility] Provider Note    Event Date/Time   First MD Initiated Contact with Patient 02/20/21 [phone]     (approximate)   History   Shoulder Pain   HPI  Michelle Waller is a 31 y.o. female presents to the ED with complaint of left rib pain for 1 month.  Patient states that for the last 3 days it has become more severe and is radiating into her left arm and left upper chest anteriorly.  Patient also reports a "swishing noise" in her left ear for the last month.  Patient reports she has had some neuropathy issues but denies any injury to her left shoulder.  Patient reports that there is a lot of stress in her life most recently and that she does have problems with anxiety.  Review of past medical history includes anxiety, hypothyroidism, irritable bowel syndrome, palpitations, GERD, gastritis, asthma, OCD, frequent headaches and eating disorder.  She rates her pain as 4 out of 10.     Physical Exam   Triage Vital Signs: ED Triage Vitals  Enc Vitals Group     BP 02/20/21 0954 110/75     Pulse Rate 02/20/21 0954 97     Resp 02/20/21 0954 18     Temp 02/20/21 0954 98.5 F (36.9 C)     Temp Source 02/20/21 0954 Oral     SpO2 02/20/21 0954 99 %     Weight 02/20/21 0941 145 lb (65.8 kg)     Height 02/20/21 0941 5\' 2"  (1.575 m)     Head Circumference --      Peak Flow --      Pain Score 02/20/21 0941 4     Pain Loc --      Pain Edu? --      Excl. in [place]? --     Most recent vital signs: Vitals:   02/20/21 0954 02/20/21 1305  BP: 110/75 111/84  Pulse: 97 97  Resp: 18 16  Temp: 98.5 F (36.9 C)   SpO2: 99% 100%     General: Awake, no distress.  Alert, talkative, cooperative. CV:  Good peripheral perfusion.  Heart regular rate and rhythm without murmur. Resp:  Normal effort.  Lungs are clear bilaterally. Abd:  No distention.  Soft, nontender, bowel sounds normoactive x4 quadrants.     ED Results / Procedures / Treatments   Labs (all labs  ordered are listed, but only abnormal results are displayed) Labs Reviewed  CBC WITH DIFFERENTIAL/PLATELET - Abnormal; Notable for the following components:      Result Value   WBC 3.6 (*)    All other components within normal limits  URINALYSIS, ROUTINE W REFLEX MICROSCOPIC - Abnormal; Notable for the following components:   Hgb urine dipstick MODERATE (*)    All other components within normal limits  URINALYSIS, MICROSCOPIC (REFLEX) - Abnormal; Notable for the following components:   Bacteria, UA RARE (*)    All other components within normal limits  COMPREHENSIVE METABOLIC PANEL  POC URINE PREG, ED  TROPONIN I (HIGH SENSITIVITY)  TROPONIN I (HIGH SENSITIVITY)     EKG  Normal is rhythm with sinus arrhythmia Vent. rate 94 BPM PR interval 120 ms QRS duration 74 ms QT/QTcB 330/412 ms P-R-T axes 74 81 45   RADIOLOGY Cervical spine x-ray images reviewed by me and no obvious abnormality was noted.  This x-ray images were reviewed by me also and no acute changes were noted.  Radiology report was reviewed for  both x-rays and no acute changes noted.    PROCEDURES:  Critical Care performed:   Procedures   MEDICATIONS ORDERED IN ED: Medications - No data to display   IMPRESSION / MDM / [place] / ED COURSE  I reviewed the triage vital signs and the nursing notes.   Differential diagnosis includes, but is not limited to, cervical radiculopathy involving left upper extremity, atypical chest pain, anxiety, and cervical degenerative disc disease.  31 year old female presents to the ED with complaint of left upper extremity pain and radiculopathy without recent injury.  States she is also become anxious that she is hearing noises in her ear that sounds like a swooshing noise.  She reports that she has had problems with neuropathy before but that this pain goes down her left arm and also up into her neck for approximately 1 month and worse for the last 4 days.  X-ray  imaging was reviewed by myself and radiology report was negative.  Troponin was 2, CBC unremarkable with the exception of a low white count at 3.6.  Urinalysis did show RBCs however patient states that this has been a trend with every urinalysis after she was noted to have kidney stones.  Pregnancy test was negative.  Patient was very anxious about taking medication however she does agree to take naproxen and methocarbamol.  She will follow-up with her PCP.  She was also encouraged to use ice or heat to her muscles around her neck and left shoulder as needed for discomfort.  She is aware that the methocarbamol could cause drowsiness and increase her risk for injury therefore she will not drive or operate machinery while taking this medication.  She will return to the emergency department if any severe worsening of her symptoms or urgent concerns.       FINAL CLINICAL IMPRESSION(S) / ED DIAGNOSES   Final diagnoses:  Cervical radiculopathy  Acute pain of left shoulder     Rx / DC Orders   ED Discharge Orders          Ordered    naproxen (NAPROSYN) 500 MG tablet  2 times daily with meals        02/20/21 1254    methocarbamol (ROBAXIN) 500 MG tablet  Every 8 hours PRN        02/20/21 1254             Note:  This document was prepared using Dragon voice recognition software and may include unintentional dictation errors.   Johnn Hai, PA-C 02/20/21 1558    Vanessa Eunice, MD 02/21/21 [phone]

## 2021-02-20 NOTE — ED Notes (Signed)
Pt presents to the ED for L shoulder pain and L chest pain. Pt states she has a hx of angina and anxiety and think they are related. Pt tearful during assessment. Pt states the pain has been constant for the past 3 days, pt also endorses SOB. Pt is A&Ox4 and NAD.

## 2021-03-29 ENCOUNTER — Ambulatory Visit: Payer: 59 | Admitting: Internal Medicine

## 2021-04-02 ENCOUNTER — Emergency Department (HOSPITAL_COMMUNITY)
Admission: EM | Admit: 2021-04-02 | Discharge: 2021-04-02 | Disposition: A | Discharge status: Home / Self Care | Payer: 59 | Attending: Emergency Medicine | Admitting: Emergency Medicine

## 2021-04-02 ENCOUNTER — Encounter (HOSPITAL_COMMUNITY): Payer: Self-pay | Admitting: *Deleted

## 2021-04-02 ENCOUNTER — Other Ambulatory Visit: Payer: Self-pay

## 2021-04-02 ENCOUNTER — Ambulatory Visit (HOSPITAL_COMMUNITY)
Admission: EM | Admit: 2021-04-02 | Discharge: 2021-04-02 | Disposition: A | Discharge status: Home / Self Care | Payer: 59 | Attending: Family | Admitting: Family

## 2021-04-02 DIAGNOSIS — U071 COVID-19: Secondary | ICD-10-CM | POA: Insufficient documentation

## 2021-04-02 DIAGNOSIS — F32A Depression, unspecified: Secondary | ICD-10-CM | POA: Insufficient documentation

## 2021-04-02 DIAGNOSIS — G4700 Insomnia, unspecified: Secondary | ICD-10-CM | POA: Insufficient documentation

## 2021-04-02 DIAGNOSIS — R45851 Suicidal ideations: Secondary | ICD-10-CM | POA: Diagnosis not present

## 2021-04-02 DIAGNOSIS — Y900 Blood alcohol level of less than 20 mg/100 ml: Secondary | ICD-10-CM | POA: Insufficient documentation

## 2021-04-02 DIAGNOSIS — F331 Major depressive disorder, recurrent, moderate: Secondary | ICD-10-CM

## 2021-04-02 DIAGNOSIS — F332 Major depressive disorder, recurrent severe without psychotic features: Secondary | ICD-10-CM | POA: Insufficient documentation

## 2021-04-02 LAB — COMPREHENSIVE METABOLIC PANEL
ALT: 17 U/L (ref 0–44)
AST: 22 U/L (ref 15–41)
Albumin: 3.9 g/dL (ref 3.5–5.0)
Alkaline Phosphatase: 58 U/L (ref 38–126)
Anion gap: 6 (ref 5–15)
BUN: 7 mg/dL (ref 6–20)
CO2: 29 mmol/L (ref 22–32)
Calcium: 9.3 mg/dL (ref 8.9–10.3)
Chloride: 106 mmol/L (ref 98–111)
Creatinine, Ser: 0.71 mg/dL (ref 0.44–1.00)
GFR, Estimated: >60 mL/min (ref >60)
Glucose, Bld: 123 mg/dL — ABNORMAL HIGH (ref 70–99)
Potassium: 4.4 mmol/L (ref 3.5–5.1)
Sodium: 141 mmol/L (ref 135–145)
Total Bilirubin: 0.2 mg/dL — ABNORMAL LOW (ref 0.3–1.2)
Total Protein: 7 g/dL (ref 6.5–8.1)

## 2021-04-02 LAB — ETHANOL: Alcohol, Ethyl (B): <10 mg/dL (ref <10)

## 2021-04-02 LAB — RAPID URINE DRUG SCREEN, HOSP PERFORMED
Amphetamines: NOT DETECTED
Barbiturates: NOT DETECTED
Benzodiazepines: NOT DETECTED
Cocaine: NOT DETECTED
Opiates: NOT DETECTED
Tetrahydrocannabinol: NOT DETECTED

## 2021-04-02 LAB — POCT URINE DRUG SCREEN - MANUAL ENTRY (I-SCREEN)
POC Amphetamine UR: NOT DETECTED
POC Buprenorphine (BUP): NOT DETECTED
POC Cocaine UR: NOT DETECTED
POC Marijuana UR: NOT DETECTED
POC Methadone UR: NOT DETECTED
POC Methamphetamine UR: NOT DETECTED
POC Morphine: NOT DETECTED
POC Oxazepam (BZO): POSITIVE — AB
POC Oxycodone UR: NOT DETECTED
POC Secobarbital (BAR): NOT DETECTED

## 2021-04-02 LAB — RESP PANEL BY RT-PCR (FLU A&B, COVID) ARPGX2
Influenza A by PCR: NEGATIVE
Influenza B by PCR: NEGATIVE
SARS Coronavirus 2 by RT PCR: POSITIVE — AB

## 2021-04-02 LAB — CBC
HCT: 36.3 % (ref 36.0–46.0)
Hemoglobin: 12 g/dL (ref 12.0–15.0)
MCH: 30.3 pg (ref 26.0–34.0)
MCHC: 33.1 g/dL (ref 30.0–36.0)
MCV: 91.7 fL (ref 80.0–100.0)
Platelets: 239 10*3/uL (ref 150–400)
RBC: 3.96 MIL/uL (ref 3.87–5.11)
RDW: 11.9 % (ref 11.5–15.5)
WBC: 4.7 10*3/uL (ref 4.0–10.5)
nRBC: 0 % (ref 0.0–0.2)

## 2021-04-02 LAB — ACETAMINOPHEN LEVEL: Acetaminophen (Tylenol), Serum: <10 ug/mL — ABNORMAL LOW (ref 10–30)

## 2021-04-02 LAB — SALICYLATE LEVEL: Salicylate Lvl: <7 mg/dL — ABNORMAL LOW (ref 7.0–30.0)

## 2021-04-02 LAB — I-STAT BETA HCG BLOOD, ED (MC, WL, AP ONLY): I-stat hCG, quantitative: <5 m[IU]/mL (ref <5)

## 2021-04-02 LAB — POC SARS CORONAVIRUS 2 AG -  ED: SARS Coronavirus 2 Ag: POSITIVE — AB

## 2021-04-02 LAB — POCT PREGNANCY, URINE: Preg Test, Ur: NEGATIVE

## 2021-04-02 LAB — POC SARS CORONAVIRUS 2 AG: SARSCOV2ONAVIRUS 2 AG: POSITIVE — AB

## 2021-04-02 MED ORDER — ALUM & MAG HYDROXIDE-SIMETH 200-200-20 MG/5ML PO SUSP: 30.0000 mL | ORAL | Status: DC | PRN

## 2021-04-02 MED ORDER — ACETAMINOPHEN 325 MG PO TABS: 650.0000 mg | ORAL_TABLET | Freq: Four times a day (QID) | ORAL | Status: DC | PRN

## 2021-04-02 MED ORDER — MAGNESIUM HYDROXIDE 400 MG/5ML PO SUSP: 30.0000 mL | Freq: Every day | ORAL | Status: DC | PRN

## 2021-04-02 NOTE — ED Notes (Signed)
PT wanded by security ?

## 2021-04-02 NOTE — ED Notes (Signed)
Pt with more questions in regard to discharge, PA aware, at bedside speaking with pt. ?

## 2021-04-02 NOTE — ED Provider Notes (Signed)
[facility] EMERGENCY DEPARTMENT Provider Note   CSN: [id number] Arrival date & time: 04/02/21  1512     History  Chief Complaint  Patient presents with   Insomnia   Suicidal    Michelle Waller is a 31 y.o. female with past medical history of anxiety, OCD, Sjogren's disease, thyroid disease, GERD, IBS.  Presents to the emergency department with complaint of insomnia and suicidal ideation.  Patient reports that she was seen at [facility] earlier this week and was admitted to [facility] inpatient.  Patient states that she left voluntarily yesterday.  Patient reports that her stay at [facility] was "terrible," and made her positive suicide, anxiety, and depression worse.  Patient reports that she was started on Zyprexa.  Patient reports that since starting this medication she feels that "I have no emotions and no feelings."  Patient last took this medication yesterday evening.  Patient also endorses taking 0.'5mg'$   Ativan yesterday evening.  Patient reports that she was seen at [facility] urgent [facility] earlier today and tested positive for COVID-19, she states that she was told to come to the emergency department for further psychiatric evaluation.  Patient endorses suicidal ideations.  Patient has no active plan for suicide.  Patient denies any previous attempts of suicide.  Patient endorses insomnia.  States that she has been having trouble sleeping over the last month.  Patient states that she gets "a few hours of sleep at night if I am lucky."  Patient states that when she was for started on Zyprexa earlier this week she had some improvement in sleep however since then she has returned to her baseline difficulty sleeping.  Patient reports that she is currently going through a divorce and does not feel that she can stay in her home with her husband.  Patient endorses having friends in the area however states that "I do not feel safe staying with them with my mental health and lack of  sleep."  Patient reports that the remainder of her family and support system are located in [place] however she has no way of getting there.  Patient denies any drug or alcohol use.  Denies any homicidal ideations, auditory hallucinations, or visual hallucinations.   Insomnia Pertinent negatives include no chest pain, no abdominal pain, no headaches and no shortness of breath.      Home Medications Prior to Admission medications   Medication Sig Start Date End Date Taking? Authorizing Provider  cholecalciferol (VITAMIN D3) 25 MCG (1000 UNIT) tablet  11/23/20   [provider]  dicyclomine (BENTYL) 10 MG capsule Take 1 capsule (10 mg total) by mouth every 6 (six) hours as needed for up to 15 doses for spasms. 12/06/20   Lin Landsman, MD  escitalopram (LEXAPRO) 5 MG tablet Take 1 tablet (5 mg total) by mouth every evening. 10/07/20   Hoyt Koch, MD  Iron-Vitamin C 100-250 MG TABS  11/24/20   [provider]  levothyroxine (SYNTHROID) 25 MCG tablet Take 1 tablet (25 mcg total) by mouth daily before breakfast. 01/21/21   Hoyt Koch, MD  LORazepam (ATIVAN) 0.5 MG tablet Take 1 tablet (0.5 mg total) by mouth daily as needed for anxiety. 01/03/21   Hoyt Koch, MD  methocarbamol (ROBAXIN) 500 MG tablet Take 1 tablet (500 mg total) by mouth every 8 (eight) hours as needed. 02/20/21   Johnn Hai, PA-C  Multiple Vitamins-Minerals (ADULT GUMMY) CHEW Chew 2 capsules by mouth daily.  [provider]  naproxen (NAPROSYN) 500 MG tablet Take 1 tablet (500 mg total) by mouth 2 (two) times daily with a meal. 02/20/21   Johnn Hai, PA-C  Omeprazole 20 MG TBEC Take 20 mg by mouth daily at 2 PM.    [provider]  Probiotic CHEW Chew 2 capsules by mouth daily.    [provider]  albuterol (PROVENTIL HFA;VENTOLIN HFA) 108 (90 Base) MCG/ACT inhaler Inhale 2 puffs into the lungs every 4 (four) hours as needed for  wheezing or shortness of breath. 11/19/17 07/05/19  Lyndal Pulley, DO  Fluticasone Propionate Truett Perna) 93 MCG/ACT EXHU Place 32 sprays into the nose in the morning and at bedtime. 05/27/19 07/05/19  Valentina Shaggy, MD  sucralfate (CARAFATE) 1 g tablet Take 1 tablet (1 g total) by mouth 4 (four) times daily -  with meals and at bedtime. 06/20/19 07/05/19  Mauri Pole, MD      Allergies    Soy allergy, Methylprednisolone, and Montelukast    Review of Systems   Review of Systems  Constitutional:  Positive for chills. Negative for fever.  Eyes:  Negative for visual disturbance.  Respiratory:  Negative for shortness of breath.   Cardiovascular:  Negative for chest pain.  Gastrointestinal:  Negative for abdominal pain, nausea and vomiting.  Genitourinary:  Negative for difficulty urinating and dysuria.  Musculoskeletal:  Negative for back pain and neck pain.  Skin:  Negative for color change and rash.  Neurological:  Negative for dizziness, syncope, light-headedness and headaches.  Psychiatric/Behavioral:  Positive for sleep disturbance and suicidal ideas. Negative for confusion and hallucinations. The patient is nervous/anxious and has insomnia.    Physical Exam Updated Vital Signs BP 137/85    Pulse 96    Temp 99.2 F (37.3 C) (Oral)    Resp 18    Ht '5\' 2"'$  (1.575 m)    Wt 65.8 kg    LMP 03/30/2021    SpO2 99%    Breastfeeding No    BMI 26.53 kg/m  Physical Exam Vitals and nursing note reviewed.  Constitutional:      General: She is not in acute distress.    Appearance: She is not ill-appearing, toxic-appearing or diaphoretic.  HENT:     Head: Normocephalic.  Eyes:     General: No scleral icterus.       Right eye: No discharge.        Left eye: No discharge.  Cardiovascular:     Rate and Rhythm: Normal rate.  Pulmonary:     Effort: Pulmonary effort is normal.  Skin:    General: Skin is warm and dry.  Neurological:     General: No focal deficit present.     Mental  Status: She is alert and oriented to person, place, and time.     GCS: GCS eye subscore is 4. GCS verbal subscore is 5. GCS motor subscore is 6.  Psychiatric:        Attention and Perception: She is attentive. She does not perceive auditory or visual hallucinations.        Mood and Affect: Mood normal.        Speech: Speech normal.        Behavior: Behavior is cooperative.        Thought Content: Thought content is not paranoid or delusional. Thought content includes suicidal ideation. Thought content does not include homicidal ideation. Thought content does not include suicidal plan.    ED Results /  Procedures / Treatments   Labs (all labs ordered are listed, but only abnormal results are displayed) Labs Reviewed  COMPREHENSIVE METABOLIC PANEL - Abnormal; Notable for the following components:      Result Value   Glucose, Bld 123 (*)    Total Bilirubin 0.2 (*)    All other components within normal limits  SALICYLATE LEVEL - Abnormal; Notable for the following components:   Salicylate Lvl <9.5 (*)    All other components within normal limits  ACETAMINOPHEN LEVEL - Abnormal; Notable for the following components:   Acetaminophen (Tylenol), Serum <10 (*)    All other components within normal limits  ETHANOL  CBC  RAPID URINE DRUG SCREEN, HOSP PERFORMED  I-STAT BETA HCG BLOOD, ED ([facility], [facility], [facility] ONLY)    EKG None  Radiology No results found.  Procedures Procedures    Medications Ordered in ED Medications - No data to display  ED Course/ Medical Decision Making/ A&P                           Medical Decision Making Amount and/or Complexity of Data Reviewed Labs: ordered.   Alert 31 year old female in no acute distress, nontoxic-appearing.  Presents to the emergency department with a chief complaint of suicidal ideation and insomnia.  Information was obtained from patient and behavioral health urgent care nurse practitioner.  Past medical records were reviewed including  previous provider notes and labs.  Patient has medical history as outlined in HPI which complicates her care.  Per chart review patient was seen at behavioral health urgent care earlier today.  Per note patient was endorsing passive suicidal ideations and insomnia.  Patient has follow-up with outpatient provider scheduled for 04/05/2021.  I spoke with nurse practitioner Oren Section who saw the patient earlier today at [facility] urgent care.  She reports that she spoke to patient's mother for collateral information.  Patient was discharged with a plan to have her girlfriend drive her [place] eventually knew where her family member was then take her the rest of the way back home.  She then returned to behavioral urgent care saying that she was unable to get to get halfway to [place].  Plan was to keep patient overnight at [facility] urgent care while she worked on plan for transportation back to [place] however patient was COVID-positive.  Patient was discharged from behavioral urgent care.  Patient was not advised to come to the emergency department as she does not meet criteria for inpatient admission at this time.  Patient does not have active plan for suicide.  No previous suicide attempts.  As patient has had psychiatric evaluation for her suicidal ideation and insomnia earlier today and was deemed safe for discharge we will not place consult for TTS.  We will place consult to [facility] due to patient's issue with homelessness.  Positive after 5 PM on the weekend we do not have a TOC provider on call.  Patient does have a friend that she can stay with at this time.  We will give patient additional resources for shelters.  I personally viewed and interpreted patient's lab work.  Lab work is unremarkable.  Per chart review patient did test positive for COVID-19 earlier today.  Discussed isolation and symptomatic treatment with patient about her COVID-19.  Discussed results, findings, treatment and  follow up. Patient advised of return precautions. Patient verbalized understanding and agreed with plan.  Patient care discussed with attending physician Dr.Pickering  Michelle Waller was evaluated in Emergency Department on 04/02/2021 for the symptoms described in the history of present illness. She was evaluated in the context of the global COVID-19 pandemic, which necessitated consideration that the patient might be at risk for infection with the SARS-CoV-2 virus that causes COVID-19. Institutional protocols and algorithms that pertain to the evaluation of patients at risk for COVID-19 are in a state of rapid change based on information released by regulatory bodies including the CDC and federal and state organizations. These policies and algorithms were followed during the patient's care in the ED.         Final Clinical Impression(s) / ED Diagnoses Final diagnoses:  Suicidal ideation  Insomnia, unspecified type  COVID-19    Rx / DC Orders ED Discharge Orders     None         Dyann Ruddle 04/02/21 1725    Davonna Belling, MD 04/02/21 2106

## 2021-04-02 NOTE — ED Provider Notes (Addendum)
Behavioral Health Admission H&P [facility] & OBS)  Date: 04/02/21 Patient Name: Michelle Waller MRN: [id number] Chief Complaint:  Chief Complaint  Patient presents with   Suicidal      Diagnoses:  Final diagnoses:  MDD (major depressive disorder), recurrent episode, moderate ([place])    HPI: Michelle Waller is a 31 y.o. female.  Presents to [facility] accompanied by her friend. reporting passive suicidal ideations.  States she was recently discharged from [facility] 1 day ago and feels that her medications are not effective. "  That was the worst place of ever been and I still I need help with sleep."  She reports multiple inpatient admissions since the start of the year.  States she has tried Abilify, Prozac, trazodone, Lexapro and is currently supposed to be taking Zyprexa.  however states she does not feel like the medication has been effective.  Reports multiple health issues related to her autoimmune disorder. "  I do not think these medications is agreeing with me. "  Patient is seeking another inpatient admission at a different behavioral health hospital.  Reports she has a follow-up appointment on 3/14 in [place]. "  I do not think that I can wait that long."  Michelle Waller provided verbal authorization to follow-up with her mother for safety planning.  Denied any safety concerns with patient returning back to [place].  Mother is requesting for somebody to assist patient with updating current insurance information.   Patient returned 10 min's later stating.. " I don't have the money to get back home and me and my husband are separating."     PHQ 2-9:   [place] [facility] from 04/02/2021 in [facility] ED from 12/16/2020 in [facility] from 12/07/2020 in [facility] [place] Moderate Risk No Risk No Risk        Total Time spent with patient: 15  minutes  Musculoskeletal  Strength & Muscle Tone: within normal limits Gait & Station: normal Patient leans: N/A  Psychiatric Specialty Exam  Presentation General Appearance: Appropriate for Environment  Eye Contact:Good  Speech:Clear and Coherent  Speech Volume:Normal  Handedness:Right   Mood and Affect  Mood:Anxious  Affect:Congruent   Thought Process  Thought Processes:Coherent  Descriptions of Associations:Intact  Orientation:Full (Time, Place and Person)  Thought Content:Logical    Hallucinations:Hallucinations: None  Ideas of Reference:None  Suicidal Thoughts:Suicidal Thoughts: Yes, Passive SI Passive Intent and/or Plan: Without Intent  Homicidal Thoughts:Homicidal Thoughts: No   Sensorium  Memory:Immediate Good; Recent Good; Remote Good  Judgment:Fair  Insight:Fair   Executive Functions  Concentration:Fair  Attention Span:Good  [place]ette recorded  Psychomotor Activity  Psychomotor Activity:Psychomotor Activity: Normal   Assets  Assets:Communication Skills; Social Support   Sleep  Sleep:Sleep: Fair   Nutritional Assessment (For OBS and FBC admissions only) Has the patient had a weight loss or gain of 10 pounds or more in the last 3 months?: No Has the patient had a decrease in food intake/or appetite?: No Does the patient have dental problems?: No Does the patient have eating habits or behaviors that may be indicators of an eating disorder including binging or inducing vomiting?: No Has the patient recently lost weight without trying?: 0 Has the patient been eating poorly because of a decreased appetite?: 0 Malnutrition Screening Tool Score: 0    Physical Exam Vitals and nursing note reviewed.  Cardiovascular:  Rate and Rhythm: Normal rate and regular rhythm.  Neurological:     Mental Status: She is oriented to person, place, and time.  Psychiatric:        Mood and  Affect: Mood normal.        Thought Content: Thought content normal.   Review of Systems  Psychiatric/Behavioral:  Positive for depression and suicidal ideas (passive ideaitons). The patient is nervous/anxious.   All other systems reviewed and are negative.  Blood pressure (!) 127/94, pulse 78, temperature 98.3 F (36.8 C), temperature source Oral, resp. rate 18, SpO2 100 %, not currently breastfeeding. There is no height or weight on file to calculate BMI.  Past Psychiatric History:   Is the patient at risk to self? No  Has the patient been a risk to self in the past 6 months? No .    Has the patient been a risk to self within the distant past? No   Is the patient a risk to others? No   Has the patient been a risk to others in the past 6 months? No   Has the patient been a risk to others within the distant past? No   Past Medical History:  Past Medical History:  Diagnosis Date   Allergy    Anemia    Anxiety    Asthma    no inhalers   Eating disorder    Family history of adverse reaction to anesthesia    mom-n/v   Frequent headaches    Gastritis    GERD (gastroesophageal reflux disease)    Hiatal hernia    Hypothyroid    IBS (irritable bowel syndrome)    Kidney stones    Obsessive compulsive disorder    Ovarian cyst    Pregnancy induced hypertension    Sjogren's disease ([place])    Sjogren's syndrome (HCC)    Thyroid disease    UTI (urinary tract infection)    Vaginal Pap smear, abnormal    Vision abnormalities     Past Surgical History:  Procedure Laterality Date   ADENOIDECTOMY     CERVIX LESION DESTRUCTION     COLONOSCOPY     x2   CYSTOSCOPY     ESOPHAGOGASTRODUODENOSCOPY     x3   KIDNEY STONE SURGERY     KNEE ARTHROSCOPY Right    KNEE SURGERY      Family History:  Family History  Problem Relation Age of Onset   Thyroid disease Mother    Hypertension Father    Diabetes Father    Thyroid disease Sister    Cancer Paternal Grandmother        breast    Diabetes Paternal Grandfather    Heart disease Paternal Grandfather    Depression Mother    Atrial fibrillation Mother    Colon polyps Father    Arthritis Maternal Grandmother    Heart disease Maternal Grandmother    Hypertension Paternal Grandmother    Hypertension Paternal Grandfather    Diabetes Mellitus II Sister    Hypothyroidism Sister    Colon cancer Neg Hx    Esophageal cancer Neg Hx    Rectal cancer Neg Hx    Stomach cancer Neg Hx     Social History:  Social History   Socioeconomic History   Marital status: Married    Spouse name: Larkin Ina   Number of children: Not on file   Years of education: Not on file   Highest education level: Not on file  Occupational History  Not on file  Tobacco Use   Smoking status: Former    Packs/day: 0.25    Years: 1.00    Pack years: 0.25    Types: Cigarettes    Quit date: 11/09/2011    Years since quitting: 9.4   Smokeless tobacco: Never  Vaping Use   Vaping Use: Never used  Substance and Sexual Activity   Alcohol use: Not Currently    Comment: occasionally    Drug use: Never   Sexual activity: Not Currently    Birth control/protection: None    Comment: undecided  Other Topics Concern   Not on file  Social History Narrative   ** Merged History Encounter **       Social Determinants of Health   Financial Resource Strain: Not on file  Food Insecurity: Not on file  Transportation Needs: Not on file  Physical Activity: Not on file  Stress: Not on file  Social Connections: Not on file  Intimate Partner Violence: Not on file    SDOH:  SDOH Screenings   Alcohol Screen: Not on file  Depression (PHQ2-9): Not on file  Financial Resource Strain: Not on file  Food Insecurity: Not on file  Housing: Not on file  Physical Activity: Not on file  Social Connections: Not on file  Stress: Not on file  Tobacco Use: Medium Risk   Smoking Tobacco Use: Former   Smokeless Tobacco Use: Never   Passive Exposure: Not on file   Transportation Needs: Not on file    Last Labs:  Admission on 02/20/2021, Discharged on 02/20/2021  Component Date Value Ref Range Status   WBC 02/20/2021 3.6 (L)  4.0 - 10.5 K/uL Final   RBC 02/20/2021 4.29  3.87 - 5.11 MIL/uL Final   Hemoglobin 02/20/2021 13.0  12.0 - 15.0 g/dL Final   HCT 02/20/2021 38.8  36.0 - 46.0 % Final   MCV 02/20/2021 90.4  80.0 - 100.0 fL Final   MCH 02/20/2021 30.3  26.0 - 34.0 pg Final   MCHC 02/20/2021 33.5  30.0 - 36.0 g/dL Final   RDW 02/20/2021 12.0  11.5 - 15.5 % Final   Platelets 02/20/2021 204  150 - 400 K/uL Final   nRBC 02/20/2021 0.0  0.0 - 0.2 % Final   Neutrophils Relative % 02/20/2021 56  % Final   Neutro Abs 02/20/2021 2.0  1.7 - 7.7 K/uL Final   Lymphocytes Relative 02/20/2021 30  % Final   Lymphs Abs 02/20/2021 1.1  0.7 - 4.0 K/uL Final   Monocytes Relative 02/20/2021 12  % Final   Monocytes Absolute 02/20/2021 0.4  0.1 - 1.0 K/uL Final   Eosinophils Relative 02/20/2021 1  % Final   Eosinophils Absolute 02/20/2021 0.1  0.0 - 0.5 K/uL Final   Basophils Relative 02/20/2021 1  % Final   Basophils Absolute 02/20/2021 0.0  0.0 - 0.1 K/uL Final   Immature Granulocytes 02/20/2021 0  % Final   Abs Immature Granulocytes 02/20/2021 0.01  0.00 - 0.07 K/uL Final   Performed at [facility], [address] [place], [place] [address]   Sodium 02/20/2021 139  135 - 145 mmol/L Final   Potassium 02/20/2021 4.2  3.5 - 5.1 mmol/L Final   Chloride 02/20/2021 105  98 - 111 mmol/L Final   CO2 02/20/2021 28  22 - 32 mmol/L Final   Glucose, Bld 02/20/2021 88  70 - 99 mg/dL Final   Glucose reference range applies only to samples taken after fasting for at  least 8 hours.   BUN 02/20/2021 8  6 - 20 mg/dL Final   Creatinine, Ser 02/20/2021 0.61  0.44 - 1.00 mg/dL Final   Calcium 02/20/2021 9.4  8.9 - 10.3 mg/dL Final   Total Protein 02/20/2021 7.4  6.5 - 8.1 g/dL Final   Albumin 02/20/2021 4.2  3.5 - 5.0 g/dL Final   AST 02/20/2021 26  15 - 41  U/L Final   ALT 02/20/2021 21  0 - 44 U/L Final   Alkaline Phosphatase 02/20/2021 63  38 - 126 U/L Final   Total Bilirubin 02/20/2021 0.5  0.3 - 1.2 mg/dL Final   GFR, Estimated 02/20/2021 >60  >60 mL/min Final   Comment: (NOTE) Calculated using the CKD-EPI Creatinine Equation (2021)    Anion gap 02/20/2021 6  5 - 15 Final   Performed at [facility], [place], [place] [address]   Troponin I (High Sensitivity) 02/20/2021 <2  <18 ng/L Final   Comment: (NOTE) Elevated high sensitivity troponin I (hsTnI) values and significant  changes across serial measurements may suggest ACS but many other  chronic and acute conditions are known to elevate hsTnI results.  Refer to the "Links" section for chest pain algorithms and additional  guidance. Performed at [facility], [address] [place], [place] [address]    Color, Urine 02/20/2021 YELLOW  YELLOW Final   APPearance 02/20/2021 CLEAR  CLEAR Final   Specific Gravity, Urine 02/20/2021 1.015  1.005 - 1.030 Final   pH 02/20/2021 7.0  5.0 - 8.0 Final   Glucose, UA 02/20/2021 NEGATIVE  NEGATIVE mg/dL Final   Hgb urine dipstick 02/20/2021 MODERATE (A)  NEGATIVE Final   Bilirubin Urine 02/20/2021 NEGATIVE  NEGATIVE Final   Ketones, ur 02/20/2021 NEGATIVE  NEGATIVE mg/dL Final   Protein, ur 02/20/2021 NEGATIVE  NEGATIVE mg/dL Final   Nitrite 02/20/2021 NEGATIVE  NEGATIVE Final   Leukocytes,Ua 02/20/2021 NEGATIVE  NEGATIVE Final   Performed at [facility], [address] [place], [place] [address]   Preg Test, Ur 02/20/2021 NEGATIVE  NEGATIVE Final   Comment:        THE SENSITIVITY OF THIS METHODOLOGY IS >24 mIU/mL    RBC / HPF 02/20/2021 11-20  0 - 5 RBC/hpf Final   WBC, UA 02/20/2021 0-5  0 - 5 WBC/hpf Final   Bacteria, UA 02/20/2021 RARE (A)  NONE SEEN Final   Squamous Epithelial / LPF 02/20/2021 11-20  0 - 5 Final   Mucus 02/20/2021 PRESENT   Final   Performed at [facility],  [address] [place], [place] [address]  Admission on 12/16/2020, Discharged on 12/16/2020  Component Date Value Ref Range Status   SARS Coronavirus 2 by RT PCR 12/16/2020 NEGATIVE  NEGATIVE Final   Comment: (NOTE) SARS-CoV-2 target nucleic acids are NOT DETECTED.  The SARS-CoV-2 RNA is generally detectable in upper respiratory specimens during the acute phase of infection. The lowest concentration of SARS-CoV-2 viral copies this assay can detect is 138 copies/mL. A negative result does not preclude SARS-Cov-2 infection and should not be used as the sole basis for treatment or other patient management decisions. A negative result may occur with  improper specimen collection/handling, submission of specimen other than nasopharyngeal swab, presence of viral mutation(s) within the areas targeted by this assay, and inadequate number of viral copies(<138 copies/mL). A negative result must be combined with clinical observations, patient history, and epidemiological information. The expected result is Negative.  Fact Sheet for Patients:  EntrepreneurPulse.com.au  Fact Sheet for Healthcare Providers:  IncredibleEmployment.be  This test is no                          t yet approved or cleared by the [place] FDA and  has been authorized for detection and/or diagnosis of SARS-CoV-2 by FDA under an Emergency Use Authorization (EUA). This EUA will remain  in effect (meaning this test can be used) for the duration of the COVID-19 declaration under Section 564(b)(1) of the Act, 21 U.S.C.section 360bbb-3(b)(1), unless the authorization is terminated  or revoked sooner.       Influenza A by PCR 12/16/2020 NEGATIVE  NEGATIVE Final   Influenza B by PCR 12/16/2020 NEGATIVE  NEGATIVE Final   Comment: (NOTE) The Xpert Xpress SARS-CoV-2/FLU/RSV plus assay is intended as an aid in the diagnosis of influenza from Nasopharyngeal swab specimens and should  not be used as a sole basis for treatment. Nasal washings and aspirates are unacceptable for Xpert Xpress SARS-CoV-2/FLU/RSV testing.  Fact Sheet for Patients: EntrepreneurPulse.com.au  Fact Sheet for Healthcare Providers: IncredibleEmployment.be  This test is not yet approved or cleared by the [place] FDA and has been authorized for detection and/or diagnosis of SARS-CoV-2 by FDA under an Emergency Use Authorization (EUA). This EUA will remain in effect (meaning this test can be used) for the duration of the COVID-19 declaration under Section 564(b)(1) of the Act, 21 U.S.C. section 360bbb-3(b)(1), unless the authorization is terminated or revoked.  Performed at [facility], [address] [place], [place] [address]   Hospital Outpatient Visit on 11/23/2020  Component Date Value Ref Range Status   Color, Urine 11/23/2020 YELLOW  YELLOW Final   APPearance 11/23/2020 CLEAR  CLEAR Final   Specific Gravity, Urine 11/23/2020 1.010  1.005 - 1.030 Final   pH 11/23/2020 7.0  5.0 - 8.0 Final   Glucose, UA 11/23/2020 NEGATIVE  NEGATIVE mg/dL Final   Hgb urine dipstick 11/23/2020 TRACE (A)  NEGATIVE Final   Bilirubin Urine 11/23/2020 NEGATIVE  NEGATIVE Final   Ketones, ur 11/23/2020 NEGATIVE  NEGATIVE mg/dL Final   Protein, ur 11/23/2020 NEGATIVE  NEGATIVE mg/dL Final   Nitrite 11/23/2020 NEGATIVE  NEGATIVE Final   Leukocytes,Ua 11/23/2020 NEGATIVE  NEGATIVE Final   Squamous Epithelial / LPF 11/23/2020 0-5  0 - 5 Final   WBC, UA 11/23/2020 0-5  0 - 5 WBC/hpf Final   RBC / HPF 11/23/2020 0-5  0 - 5 RBC/hpf Final   Bacteria, UA 11/23/2020 NONE SEEN  NONE SEEN Final   Performed at [facility], [address], [place], [place] [address]  Appointment on 11/18/2020  Component Date Value Ref Range Status   VITD 11/18/2020 28.93 (L)  30.00 - 100.00 ng/mL Final   TSH 11/18/2020 1.32  0.35 - 5.50 uIU/mL Final   Vitamin B-12  11/18/2020 989 (H)  211 - 911 pg/mL Final  Office Visit on 11/16/2020  Component Date Value Ref Range Status   Bacterial Vaginitis (gardnerella) 11/16/2020 Negative   Final   Candida Vaginitis 11/16/2020 Negative   Final   Candida Glabrata 11/16/2020 Negative   Final   Comment 11/16/2020 Normal Reference Range Bacterial Vaginosis - Negative   Final   Comment 11/16/2020 Normal Reference Range Candida Species - Negative   Final   Comment 11/16/2020 Normal Reference Range Candida Galbrata - Negative   Final  Hospital Outpatient Visit on 10/19/2020  Component Date Value Ref Range Status  Color, Urine 10/19/2020 YELLOW  YELLOW Final   APPearance 10/19/2020 CLEAR  CLEAR Final   Specific Gravity, Urine 10/19/2020 1.025  1.005 - 1.030 Final   pH 10/19/2020 7.0  5.0 - 8.0 Final   Glucose, UA 10/19/2020 NEGATIVE  NEGATIVE mg/dL Final   Hgb urine dipstick 10/19/2020 TRACE (A)  NEGATIVE Final   Bilirubin Urine 10/19/2020 NEGATIVE  NEGATIVE Final   Ketones, ur 10/19/2020 NEGATIVE  NEGATIVE mg/dL Final   Protein, ur 10/19/2020 NEGATIVE  NEGATIVE mg/dL Final   Nitrite 10/19/2020 NEGATIVE  NEGATIVE Final   Leukocytes,Ua 10/19/2020 NEGATIVE  NEGATIVE Final   Squamous Epithelial / LPF 10/19/2020 21-50  0 - 5 Final   WBC, UA 10/19/2020 6-10  0 - 5 WBC/hpf Final   RBC / HPF 10/19/2020 6-10  0 - 5 RBC/hpf Final   Bacteria, UA 10/19/2020 FEW (A)  NONE SEEN Final   Performed at [facility], [address], [place], [place] [address]  Admission on 10/17/2020, Discharged on 10/17/2020  Component Date Value Ref Range Status   Sodium 10/16/2020 136  135 - 145 mmol/L Final   Potassium 10/16/2020 3.6  3.5 - 5.1 mmol/L Final   Chloride 10/16/2020 103  98 - 111 mmol/L Final   CO2 10/16/2020 25  22 - 32 mmol/L Final   Glucose, Bld 10/16/2020 93  70 - 99 mg/dL Final   Glucose reference range applies only to samples taken after fasting for at least 8 hours.   BUN 10/16/2020 15  6 - 20 mg/dL Final    Creatinine, Ser 10/16/2020 0.87  0.44 - 1.00 mg/dL Final   Calcium 10/16/2020 8.7 (L)  8.9 - 10.3 mg/dL Final   GFR, Estimated 10/16/2020 >60  >60 mL/min Final   Comment: (NOTE) Calculated using the CKD-EPI Creatinine Equation (2021)    Anion gap 10/16/2020 8  5 - 15 Final   Performed at [facility], [address] [place], [place] [address]   WBC 10/16/2020 4.1  4.0 - 10.5 K/uL Final   RBC 10/16/2020 3.97  3.87 - 5.11 MIL/uL Final   Hemoglobin 10/16/2020 12.3  12.0 - 15.0 g/dL Final   HCT 10/16/2020 35.3 (L)  36.0 - 46.0 % Final   MCV 10/16/2020 88.9  80.0 - 100.0 fL Final   MCH 10/16/2020 31.0  26.0 - 34.0 pg Final   MCHC 10/16/2020 34.8  30.0 - 36.0 g/dL Final   RDW 10/16/2020 11.5  11.5 - 15.5 % Final   Platelets 10/16/2020 160  150 - 400 K/uL Final   nRBC 10/16/2020 0.0  0.0 - 0.2 % Final   Performed at [facility], [address], [place] [address]   Troponin I (High Sensitivity) 10/16/2020 <2  <18 ng/L Final   Comment: (NOTE) Elevated high sensitivity troponin I (hsTnI) values and significant  changes across serial measurements may suggest ACS but many other  chronic and acute conditions are known to elevate hsTnI results.  Refer to the "Links" section for chest pain algorithms and additional  guidance. Performed at [facility], [address] [place], [place] [address]    Preg Test, Ur 10/17/2020 NEGATIVE  NEGATIVE Final   Comment:        THE SENSITIVITY OF THIS METHODOLOGY IS >24 mIU/mL    Total CK 10/16/2020 128  38 - 234 U/L Final   Performed at [facility], [place], [place] [address]   Troponin I (High Sensitivity) 10/17/2020 <2  <  18 ng/L Final   Comment: (NOTE) Elevated high sensitivity troponin I (hsTnI) values and significant  changes across serial measurements may suggest ACS but many other  chronic and acute conditions are known to elevate hsTnI results.  Refer to the "Links" section for  chest pain algorithms and additional  guidance. Performed at [facility], [address] [place], [place] [address]    TSH 10/17/2020 2.655  0.350 - 4.500 uIU/mL Final   Comment: Performed by a 3rd Generation assay with a functional sensitivity of <=0.01 uIU/mL. Performed at [facility], [address] [place], [place] [address]    Free T4 10/17/2020 0.71  0.61 - 1.12 ng/dL Final   Comment: (NOTE) Biotin ingestion may interfere with free T4 tests. If the results are inconsistent with the TSH level, previous test results, or the clinical presentation, then consider biotin interference. If needed, order repeat testing after stopping biotin. Performed at [facility], [address] [place], [place] [address]    D-Dimer, America Brown 10/17/2020 0.79 (H)  0.00 - 0.50 ug/mL-FEU Final   Comment: (NOTE) At the manufacturer cut-off value of 0.5 g/mL FEU, this assay has a negative predictive value of 95-100%.This assay is intended for use in conjunction with a clinical pretest probability (PTP) assessment model to exclude pulmonary embolism (PE) and deep venous thrombosis (DVT) in outpatients suspected of PE or DVT. Results should be correlated with clinical presentation. Performed at [facility], [address] [place], [place] [address]     Allergies: Soy allergy, Methylprednisolone, and Montelukast  PTA Medications: (Not in a hospital admission)   Medical Decision Making  Overnight observation     Recommendations  Based on my evaluation the patient does not appear to have an emergency medical condition.  patient is Covid +, patient to f/u with outpatient resources   Derrill [place], NP 04/02/21  12:51 PM

## 2021-04-02 NOTE — Care Management (Signed)
Patient states does not have a place to stay, as well is COVID positive. Homeless resources attached to chart. IRC to start, however unsure if can stay in warming [facility] while COVID positive.  Will have to defer to her inquiring at [facility] ?

## 2021-04-02 NOTE — Discharge Instructions (Addendum)
You came to the emergency department today to be evaluated for your suicidal ideations and insomnia.  You spoke with the psychiatry team earlier today and did not meet criteria for inpatient mission at this time.  Please continue to take all your medications as prescribed.  Please follow-up with your psychiatric provider in the outpatient setting.  Please follow-up with your primary care provider in the outpatient setting.  Please follow-up with the behavioral health urgent care as needed for any further issues with your mental health needs.  Please return to the emergency department if you attempt suicide or develop a plan for suicide with intent to carry out this plan. ? ?I have placed a consult for our transition of care team to speak with you about your housing needs.  In the meantime I have attached paperwork with shelters in the area. ? ?You tested positive for COVID-19. Please isolate at home for at least 5 days after the day your symptoms initially began, and THEN at least 24 hours after you are fever-free without the help of medications (Tylenol/acetaminophen and Advil/ibuprofen/Motrin) AND your symptoms are improving. ? ?You can alternate Tylenol/acetaminophen and Advil/ibuprofen/Motrin every 4 hours for sore throat, body aches, headache or fever.  ?Drink plenty of water.  ?Use saline nasal spray for congestion. ? ?Wash your hands frequently. ?Please rest as needed with frequent repositioning and ambulation as tolerated.   ? ?If you use a CPAP or BiPAP device for management of obstructive sleep apnea may continue to use it however use it when isolated from other individuals to avoid spread of COVID-19.   ?If you use a nebulizer administer medication such as albuterol you may continue to use it however only one isolated from other individuals to avoid the spread of COVID-19. ? ?If your symptoms do not improve please follow-up with your primary care provider or urgent care. ? ?Return to the ER for  significant shortness of breath, uncontrollable vomiting, severe chest pain, inability to tolerate fluids, changes in mental status such as confusion or other concerning symptoms. ? ?

## 2021-04-02 NOTE — ED Notes (Signed)
Pt's chart was accessed by this writer, because Triage NT had stated she was being moved to one of this writer's rooms.  Unfortunately, Pt was moved to a different location.  ?

## 2021-04-02 NOTE — ED Triage Notes (Addendum)
Pt states she was admitted to [facility] and was discharged yesterday.  Pt had a very bad experience there and would like help in [place].  Pt states she is suicidal - lost her job, just separated from her husband and her daughter is with her parents. ? ?Pt was on xyprexa and it is not helping.  She states she has not slept for several days. ? ?Pt has thoughts of ending her life with a knife. ?

## 2021-04-02 NOTE — Progress Notes (Signed)
?   04/02/21 1220  ?[facility] Triage Screening (Walk-ins at [facility] only)  ?How Did You Hear About [place]? Self  ?What Is the Reason for Your Visit/Call Today? Michelle Waller is a 31yo female reporting to [facility] for evaluation of suicidal ideation with undisclosed plan. Pt states that she has a significant BH history both in [place] and in [place].  Pt reports that she was just released from [facility] yesterday, and that it was not a pleasant experience. Pt reports that she was referred to [facility] Psychiatry in [facility] for outpatient psychiatric follow up.  Pt reports that she is not happy with the behavioral health system in  and wants to go back to [place] where her parents live and can be supportive of her there. Pt is currently taking zyprexa and propranolol. Pt has a history of taking prozac, abilify, and trazodone. Pt reports current SI with an undisclosed plan. Pt denies HI or AVH.  Pt denies any substance use.  ?How Long Has This Been Causing You Problems? > than 6 months  ?Have You Recently Had Any Thoughts About Hurting Yourself? Yes  ?How long ago did you have thoughts about hurting yourself? today  ?Are You Planning to Commit Suicide/Harm Yourself At This time? Yes  ?Have you Recently Had Thoughts About [place]? No  ?Are You Planning To Harm Someone At This Time? No  ?Are you currently experiencing any auditory, visual or other hallucinations? No  ?Have You Used Any Alcohol or Drugs in the Past 24 Hours? No  ?Do you have any current medical co-morbidities that require immediate attention? Yes  ?Please describe current medical co-morbidities that require immediate attention: Pt reports that she has heart issues, fibromyalgia, and sjogren's  ?Clinician description of patient physical appearance/behavior: pt seems very anxious and fearful  ?What Do You Feel Would Help You the Most Today? Treatment for Depression or other mood problem  ?If access to [facility] Urgent Care was not available, would you have sought care in the Emergency  Department? Yes  ?Determination of Need Urgent (48 hours)  ?Options For Referral Outpatient Therapy;Medication Management;Inpatient Hospitalization;BH Urgent Care  ? ? ?

## 2021-04-02 NOTE — BH Assessment (Signed)
Per Ricky Ala NP pt does not meet inpatient criteria and may be discharged with reosources. ?

## 2021-04-02 NOTE — Discharge Instructions (Signed)
Take all medications as prescribed. Keep all follow-up appointments as scheduled.  Do not consume alcohol or use illegal drugs while on prescription medications. Report any adverse effects from your medications to your primary care provider promptly.  In the event of recurrent symptoms or worsening symptoms, call 911, a crisis hotline, or go to the nearest emergency department for evaluation.   

## 2021-04-02 NOTE — BH Assessment (Addendum)
Comprehensive Clinical Assessment (CCA) Note  04/02/2021 Michelle Waller [id number]  Chief Complaint:  Chief Complaint  Patient presents with   Suicidal   Visit Diagnosis:   MDD, recurrent, moderate Suicidal ideation  Disposition:  Per Michelle Ala NP pt meets criteria for [facility] OBS unit admission for continuous assessment and safety monitoring. Pt will have psychiatric reassessment in AM.  [place] ED from 04/02/2021 in [facility] ED from 12/16/2020 in [place] ED from 12/07/2020 in [facility] [place] Moderate Risk No Risk No Risk      The patient demonstrates the following risk factors for suicide: Chronic risk factors for suicide include: psychiatric disorder of depression . Acute risk factors for suicide include: family or marital conflict, unemployment, social withdrawal/isolation, loss (financial, interpersonal, professional), and recent discharge from inpatient psychiatry. Protective factors for this patient include: coping skills. Considering these factors, the overall suicide risk at this point appears to be moderate. Patient is not appropriate for outpatient follow up.   CCA Screening, Triage and Referral (STR)  Patient Reported Information How did you hear about [place]? Self  What Is the Reason for Your Visit/Call Today? Michelle Waller is a 31yo female reporting to [facility] for evaluation of suicidal ideation with undisclosed plan. Pt states that she has a significant BH history both in [place] and in [place].  Pt reports that she was just released from [facility] yesterday, and that it was not a pleasant experience. Pt reports that she was referred to [facility] Psychiatry in [facility] for outpatient psychiatric follow up.  Pt reports that she is not happy with the behavioral health system in [place] and wants to go back to [place] where her parents live and can be supportive of her there. Pt is currently  taking zyprexa and propranolol. Pt has a history of taking prozac, abilify, and trazodone. Pt reports current SI with an undisclosed plan. Pt denies HI or AVH.  Pt denies any substance use.  How Long Has This Been Causing You Problems? > than 6 months  What Do You Feel Would Help You the Most Today? Treatment for Depression or other mood problem   Have You Recently Had Any Thoughts About Hurting Yourself? Yes  Are You Planning to Commit Suicide/Harm Yourself At This time? Yes   Have you Recently Had Thoughts About Hurting Someone Michelle Waller? No  Are You Planning to Harm Someone at This Time? No  Explanation: No data recorded  Have You Used Any Alcohol or Drugs in the Past 24 Hours? No  How Long Ago Did You Use Drugs or Alcohol? No data recorded What Did You Use and How Much? No data recorded  Do You Currently Have a Therapist/Psychiatrist? Yes  Name of Therapist/Psychiatrist: Pt has been referred to [facility] Psychiatry in [place] Recently Discharged From Any Office Practice or Programs? Yes  Explanation of Discharge From Practice/Program: Recent discharge from [place] behavioral health hospitalization (04/01/2021)     CCA Screening Triage Referral Assessment Type of Contact: Face-to-Face  Telemedicine Service Delivery:   Is this Initial or Reassessment? No data recorded Date Telepsych consult ordered in [facility]:  No data recorded Time Telepsych consult ordered in [facility]:  No data recorded Location of Assessment: [facility] Assessment Services  Provider Location: [facility] Assessment Services   Collateral Involvement: Pt gave verbal consent for her friend to accompany her throughout triage process   Does  Patient Have a Stage manager Guardian? No data recorded Name and Contact of Legal Guardian: No data recorded If Minor and Not Living with Parent(s), Who has Custody? n/a  Is CPS involved or ever been involved? Never  Is APS involved or ever been  involved? Never   Patient Determined To Be At Risk for Harm To Self or Others Based on Review of Patient Reported Information or Presenting Complaint? Yes, for Self-Harm  Method: No data recorded Availability of Means: No data recorded Intent: No data recorded Notification Required: No data recorded Additional Information for Danger to Others Potential: No data recorded Additional Comments for Danger to Others Potential: No data recorded Are There Guns or Other Weapons in Your Home? No data recorded Types of Guns/Weapons: No data recorded Are These Weapons Safely Secured?                            No data recorded Who Could Verify You Are Able To Have These Secured: No data recorded Do You Have any Outstanding Charges, Pending Court Dates, Parole/Probation? No data recorded Contacted To Inform of Risk of Harm To Self or Others: Other: Comment (n/a)    Does Patient Present under Involuntary Commitment? No data recorded IVC Papers Initial File Date: No data recorded  [place] of Residence: No data recorded  Patient Currently Receiving the Following Services: Medication Management   Determination of Need: Urgent (48 hours)   Options For Referral: [facility] Urgent Care; Medication Management; Outpatient Therapy ([place] OBS UNIT)     CCA Biopsychosocial Patient Reported Schizophrenia/Schizoaffective Diagnosis in Past: No   Strengths: good support   Mental Health Symptoms Depression:  Hopelessness; Sleep (too much or little); Increase/decrease in appetite; Difficulty Concentrating; Change in energy/activity; Tearfulness; Weight gain/loss   Duration of Depressive symptoms: Duration of Depressive Symptoms: Greater than two weeks   Mania:  None   Anxiety:   Worrying; Sleep; Restlessness; Irritability; Fatigue; Difficulty concentrating   Psychosis:  None   Duration of Psychotic symptoms:    Trauma:  No data recorded  Obsessions:  None   Compulsions:  None   Inattention:   None   Hyperactivity/Impulsivity:  None   Oppositional/Defiant Behaviors:  None   Emotional Irregularity:  Mood lability   Other Mood/Personality Symptoms:  No data recorded   Mental Status Exam Appearance and self-care  Stature:  Average   Weight:  Average weight   Clothing:  Neat/clean   Grooming:  Normal   Cosmetic use:  None   Posture/gait:  Normal   Motor activity:  Not Remarkable   Sensorium  Attention:  Normal   Concentration:  Normal   Orientation:  X5   Recall/memory:  Normal   Affect and Mood  Affect:  Anxious; Depressed   Mood:  Anxious; Depressed   Relating  Eye contact:  Normal   Facial expression:  Anxious; Depressed   Attitude toward examiner:  Cooperative   Thought and Language  Speech flow: Soft   Thought content:  Appropriate to Mood and Circumstances   Preoccupation:  Suicide   Hallucinations:  None   Organization:  No data recorded  [employer] of Knowledge:  Good   Intelligence:  Average   Abstraction:  Normal   Judgement:  Impaired   Reality Testing:  Variable   Insight:  Gaps   Decision Making:  Impulsive   Social Functioning  Social Maturity:  Irresponsible; Impulsive   Social Judgement:  Heedless; Impropriety   Stress  Stressors:  Family conflict; Housing; Relationship; Illness   Coping Ability:  Overwhelmed   Skill Deficits:  Self-control; Self-care; Responsibility; Interpersonal   Supports:  Friends/Service system     Religion: Religion/Spirituality Are You A Religious Person?: Yes How Might This Affect Treatment?: n/a  Leisure/Recreation: Leisure / Recreation Do You Have Hobbies?: Yes Leisure and Hobbies: "I don't really have energy or enjoy anything at all anymore but i used to do things outside and go places outside"  Exercise/Diet: Exercise/Diet Do You Exercise?: No Have You Gained or Lost A Significant Amount of Weight in the Past Six Months?: Yes-Lost Number of Pounds  Lost?: 10 Do You Follow a Special Diet?: No Do You Have Any Trouble Sleeping?: Yes Explanation of Sleeping Difficulties: insomnia most nights   CCA Employment/Education Employment/Work Situation: Employment / Work Situation Employment Situation: Unemployed Patient's Job has Been Impacted by Current Illness: Yes Describe how Patient's Job has Been Impacted: "I am not able to work" Has Patient ever Been in Passenger transport manager?: No  Education: Education Is Patient Currently Attending School?: No Last Grade Completed: 12 Did You Nutritional therapist?: Yes 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 Brother with bipolar disorder.) Did patient suffer any verbal/emotional/physical/sexual abuse as a child?: No Did patient suffer from severe childhood neglect?: No Has patient ever been sexually abused/assaulted/raped as an adolescent or adult?: No Was the patient ever a victim of a crime or a disaster?: No Witnessed domestic violence?: No (witnessed verbal arguments between parents) Has patient been affected by domestic violence as an adult?: No  Child/Adolescent Assessment:  none   CCA Substance Use Alcohol/Drug Use: Alcohol / Drug Use Pain Medications: SEE MAR Prescriptions: SEE MAR Over the Counter: SEE MAR History of alcohol / drug use?: No history of alcohol / drug abuse Negative  Consequences of Use:  (NONE) Withdrawal Symptoms: None    ASAM's:  Six Dimensions of Multidimensional Assessment  Dimension 1:  Acute Intoxication and/or Withdrawal Potential:   Dimension 1:  Description of individual's past and current experiences of substance use and withdrawal: PT DENIES  Dimension 2:  Biomedical Conditions and Complications:      Dimension 3:  Emotional, Behavioral, or Cognitive Conditions and Complications:     Dimension 4:  Readiness to Change:     Dimension 5:  Relapse, Continued use, or Continued Problem Potential:     Dimension 6:  Recovery/Living Environment:     ASAM Severity Score: ASAM's Severity Rating Score: 0  ASAM Recommended Level of Treatment: ASAM Recommended Level of Treatment: Level I Outpatient Treatment   Substance use Disorder (SUD) Substance Use Disorder (SUD)  Checklist Symptoms of Substance Use: Evidence of withdrawal (Comment) (PT STATES SHE'S WITHDRAWING OFF ATIVAN)  Recommendations for Services/Supports/Treatments: Recommendations for Services/Supports/Treatments Recommendations For Services/Supports/Treatments: Other (Comment) ([place] OBSERVATION UNIT)  Discharge Disposition:  [place] OBSERVATION UNIT  DSM5 Diagnoses: Patient Active Problem List   Diagnosis Date Noted   Severe episode of  recurrent major depressive disorder, without psychotic features ([place])    Suicidal ideation    MDD (major depressive disorder), recurrent episode, moderate ([place])    Snoring 07/01/2020   Blood pressure alteration 03/05/2020   Seasonal and perennial allergic rhinitis 04/16/2019   Palpitations 01/15/2019   Nonallopathic lesion of lumbosacral region 10/02/2017   Nonallopathic lesion of sacral region 10/02/2017   Irritable bowel syndrome 06/21/2017   Routine general medical examination at a health care facility 06/14/2017   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

## 2021-04-02 NOTE — ED Provider Notes (Addendum)
FBC/OBS ASAP Discharge Summary  Date and Time: 04/02/2021 2:26 PM  Name: Michelle Waller  MRN:  [id number]   Discharge Diagnoses:  Final diagnoses:  MDD (major depressive disorder), recurrent episode, moderate ([place])   Per admission assessment note: Michelle Waller is a 31 y.o. female.  Presents to [facility] accompanied by her friend. reporting passive suicidal ideations.  States she was recently discharged from [facility],  1 day ago and feels that her medications are not effective. "  That was the worst place of ever been and I still I need help with sleeping."  Patient was Covid +, she will follow-up with outpatient providers.   [facility], 31 y.o., female patient seen face to face by this provider, consulted with Dr. Leverne Humbles; and chart reviewed on 04/02/21.  On evaluation Michelle Waller is requesting another inpatient admission.    During evaluation Michelle Waller is sitting in no acute distress. She is alert/oriented x 4; calm/cooperative; and mood congruent with affect. She is speaking in a clear tone at moderate volume, and normal pace; with good eye contact. Her thought process is coherent and relevant; There is no indication that she is currently responding to internal/external stimuli or experiencing delusional thought content; and she has denied suicidal/self-harm/homicidal ideation, psychosis, and paranoia.   Patient has remained calm throughout assessment and has answered questions appropriately.     At this time Michelle Waller is educated and verbalizes understanding of mental health resources and other crisis services in the community.She is instructed to call 911 and present to the nearest emergency room should she experience any suicidal/homicidal ideation with plan and intent  auditory/visual/hallucinations, or detrimental worsening of her mental health condition. She was a also advised by Probation officer that she could call the toll-free phone on insurance card  to assist with identifying in network counselors and agencies or number on back of Medicaid card to speak with care coordinator      Total Time spent with patient: 15 minutes  Past Psychiatric History:  Past Medical History:  Past Medical History:  Diagnosis Date   Allergy    Anemia    Anxiety    Asthma    no inhalers   Eating disorder    Family history of adverse reaction to anesthesia    mom-n/v   Frequent headaches    Gastritis    GERD (gastroesophageal reflux disease)    Hiatal hernia    Hypothyroid    IBS (irritable bowel syndrome)    Kidney stones    Obsessive compulsive disorder    Ovarian cyst    Pregnancy induced hypertension    Sjogren's disease ([place])    Sjogren's syndrome (HCC)    Thyroid disease    UTI (urinary tract infection)    Vaginal Pap smear, abnormal    Vision abnormalities     Past Surgical History:  Procedure Laterality Date   ADENOIDECTOMY     CERVIX LESION DESTRUCTION     COLONOSCOPY     x2   CYSTOSCOPY     ESOPHAGOGASTRODUODENOSCOPY     x3   KIDNEY STONE SURGERY     KNEE ARTHROSCOPY Right    KNEE SURGERY     Family History:  Family History  Problem Relation Age of Onset   Thyroid disease Mother    Hypertension Father    Diabetes Father    Thyroid disease Sister    Cancer Paternal Grandmother        breast   Diabetes Paternal Grandfather  Heart disease Paternal Grandfather    Depression Mother    Atrial fibrillation Mother    Colon polyps Father    Arthritis Maternal Grandmother    Heart disease Maternal Grandmother    Hypertension Paternal Grandmother    Hypertension Paternal Grandfather    Diabetes Mellitus II Sister    Hypothyroidism Sister    Colon cancer Neg Hx    Esophageal cancer Neg Hx    Rectal cancer Neg Hx    Stomach cancer Neg Hx    Family Psychiatric History:  Social History:  Social History   Substance and Sexual Activity  Alcohol Use Not Currently   Comment: occasionally      Social History    Substance and Sexual Activity  Drug Use Never    Social History   Socioeconomic History   Marital status: Married    Spouse name: Larkin Ina   Number of children: Not on file   Years of education: Not on file   Highest education level: Not on file  Occupational History   Not on file  Tobacco Use   Smoking status: Former    Packs/day: 0.25    Years: 1.00    Pack years: 0.25    Types: Cigarettes    Quit date: 11/09/2011    Years since quitting: 9.4   Smokeless tobacco: Never  Vaping Use   Vaping Use: Never used  Substance and Sexual Activity   Alcohol use: Not Currently    Comment: occasionally    Drug use: Never   Sexual activity: Not Currently    Birth control/protection: None    Comment: undecided  Other Topics Concern   Not on file  Social History Narrative   ** Merged History Encounter **       Social Determinants of Health   Financial Resource Strain: Not on file  Food Insecurity: Not on file  Transportation Needs: Not on file  Physical Activity: Not on file  Stress: Not on file  Social Connections: Not on file   SDOH:  SDOH Screenings   Alcohol Screen: Not on file  Depression (PHQ2-9): Not on file  Financial Resource Strain: Not on file  Food Insecurity: Not on file  Housing: Not on file  Physical Activity: Not on file  Social Connections: Not on file  Stress: Not on file  Tobacco Use: Medium Risk   Smoking Tobacco Use: Former   Smokeless Tobacco Use: Never   Passive Exposure: Not on file  Transportation Needs: Not on file    Tobacco Cessation:  N/A, patient does not currently use tobacco products  Current Medications:  Current Facility-Administered Medications  Medication Dose Route Frequency Provider Last Rate Last Admin   acetaminophen (TYLENOL) tablet 650 mg  650 mg Oral Q6H PRN Derrill Center, NP       alum & mag hydroxide-simeth (MAALOX/MYLANTA) 200-200-20 MG/5ML suspension 30 mL  30 mL Oral Q4H PRN Derrill Center, NP       magnesium  hydroxide (MILK OF MAGNESIA) suspension 30 mL  30 mL Oral Daily PRN Derrill Center, NP       Current Outpatient Medications  Medication Sig Dispense Refill   cholecalciferol (VITAMIN D3) 25 MCG (1000 UNIT) tablet      dicyclomine (BENTYL) 10 MG capsule Take 1 capsule (10 mg total) by mouth every 6 (six) hours as needed for up to 15 doses for spasms. 15 capsule 0   escitalopram (LEXAPRO) 5 MG tablet Take 1 tablet (5 mg total) by  mouth every evening. 90 tablet 3   Iron-Vitamin C 100-250 MG TABS      levothyroxine (SYNTHROID) 25 MCG tablet Take 1 tablet (25 mcg total) by mouth daily before breakfast. 90 tablet 1   LORazepam (ATIVAN) 0.5 MG tablet Take 1 tablet (0.5 mg total) by mouth daily as needed for anxiety. 20 tablet 0   methocarbamol (ROBAXIN) 500 MG tablet Take 1 tablet (500 mg total) by mouth every 8 (eight) hours as needed. 15 tablet 0   Multiple Vitamins-Minerals (ADULT GUMMY) CHEW Chew 2 capsules by mouth daily.     naproxen (NAPROSYN) 500 MG tablet Take 1 tablet (500 mg total) by mouth 2 (two) times daily with a meal. 30 tablet 0   Omeprazole 20 MG TBEC Take 20 mg by mouth daily at 2 PM.     Probiotic CHEW Chew 2 capsules by mouth daily.      PTA Medications: (Not in a hospital admission)   Musculoskeletal  Strength & Muscle Tone: within normal limits Gait & Station: normal Patient leans: N/A  Psychiatric Specialty Exam  Presentation  General Appearance: Appropriate for Environment  Eye Contact:Good  Speech:Clear and Coherent  Speech Volume:Normal  Handedness:Right   Mood and Affect  Mood:Anxious  Affect:Congruent   Thought Process  Thought Processes:Coherent  Descriptions of Associations:Intact  Orientation:Full (Time, Place and Person)  Thought Content:Logical  Diagnosis of Schizophrenia or Schizoaffective disorder in past: No    Hallucinations:Hallucinations: None  Ideas of Reference:None  Suicidal Thoughts:Suicidal Thoughts: Yes, Passive SI  Passive Intent and/or Plan: Without Intent  Homicidal Thoughts:Homicidal Thoughts: No   Sensorium  Memory:Immediate Good; Recent Good; Remote Good  Judgment:Fair  Insight:Fair   Executive Functions  Concentration:Fair  Attention Span:Good  [place] recorded  Psychomotor Activity  Psychomotor Activity:Psychomotor Activity: Normal   Assets  Assets:Communication Skills; Social Support   Sleep  Sleep:Sleep: Fair   Nutritional Assessment (For OBS and FBC admissions only) Has the patient had a weight loss or gain of 10 pounds or more in the last 3 months?: No Has the patient had a decrease in food intake/or appetite?: No Does the patient have dental problems?: No Does the patient have eating habits or behaviors that may be indicators of an eating disorder including binging or inducing vomiting?: No Has the patient recently lost weight without trying?: 0 Has the patient been eating poorly because of a decreased appetite?: 0 Malnutrition Screening Tool Score: 0    Physical Exam  Physical Exam Vitals and nursing note reviewed.  Cardiovascular:     Rate and Rhythm: Normal rate and regular rhythm.  Psychiatric:        Mood and Affect: Mood normal.        Behavior: Behavior normal.   Review of Systems  HENT: Negative.    Cardiovascular: Negative.   Gastrointestinal: Negative.   Psychiatric/Behavioral:  Positive for depression. Negative for suicidal ideas. The patient is nervous/anxious.   All other systems reviewed and are negative. Blood pressure (!) 127/94, pulse 78, temperature 98.3 F (36.8 C), temperature source Oral, resp. rate 18, SpO2 100 %, not currently breastfeeding. There is no height or weight on file to calculate BMI.  Demographic Factors:  Caucasian  Loss Factors: NA  Historical Factors: Impulsivity  Risk Reduction Factors:   Responsible for children under 75 years of age  Continued Clinical  Symptoms:  Severe Anxiety and/or Agitation Depression:   Anhedonia  Cognitive Features That Contribute To Risk:  Closed-mindedness  Suicide Risk:  Minimal: No identifiable suicidal ideation.  Patients presenting with no risk factors but with morbid ruminations; may be classified as minimal risk based on the severity of the depressive symptoms  Plan Of Care/Follow-up recommendations:  Activity:  as tolerated Diet:  heart healthy   Disposition: Take all medications as prescribed. Keep all follow-up appointments as scheduled.  Do not consume alcohol or use illegal drugs while on prescription medications. Report any adverse effects from your medications to your primary care provider promptly.  In the event of recurrent symptoms or worsening symptoms, call 911, a crisis hotline, or go to the nearest emergency department for evaluation.    Derrill Center, NP 04/02/2021, 2:26 PM

## 2021-04-02 NOTE — ED Provider Notes (Signed)
Behavioral Health Urgent Care Medical Screening Exam ? ?Patient Name: [facility] ?MRN: [id number] ?Date of Evaluation: 04/02/21 ?Chief Complaint:   ?Diagnosis:  ?Final diagnoses:  ?None  ? ? ?History of Present illness: Michelle Waller is a 31 y.o. female.  Presents to [facility] accompanied by her friend. reporting passive suicidal ideations.  States she was recently discharged from [facility] 1 day ago and feels that her medications are not effective. "  That was the worst place of ever been and I still I need help." ? ?She reports multiple inpatient admissions since the start of the year.  States she has tried Abilify, Prozac, trazodone, Lexapro and is currently supposed to be taking Zyprexa.  however states she does not feel like the medication has been effective. ? 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 "  I do not think these medications is agreeing with me. "  Patient is seeking another inpatient admission at a different behavioral health hospital.  Reports she has a follow-up appointment on 3/14 in [place]. "  I do not think that I can wait that long." ? ?Nili provided verbal authorization to follow-up with her mother for safety planning.  Denied any safety concerns with patient returning back to [place].  Mother is requesting for somebody to assist patient with updating current insurance information.  ? ?Psychiatric Specialty Exam ? ?Presentation  ?General Appearance:Appropriate for Environment ? ?Eye Contact:Good ? ?Speech:Clear and Coherent ? ?Speech Volume:Normal ? ?Handedness:Right ? ? ?Mood and Affect  ?Mood:Anxious ? ?Affect:Congruent ? ? ?Thought Process  ?Thought Processes:Coherent ? ?Descriptions of Associations:Intact ? ?Orientation:Full (Time, Place and Person) ? ?Thought Content:Logical ?   Hallucinations:None ? ?Ideas of Reference:None ? ?Suicidal Thoughts:Yes, Passive ?Without Intent ? ?Homicidal Thoughts:No ? ? ?Sensorium   ?Memory:Immediate Good; Recent Good; Remote Good ? ?Judgment:Fair ? ?Insight:Fair ? ? ?Executive Functions  ?Concentration:Fair ? ?Attention Span:Good ? ?Recall:Good ? ?[employer] [facility] [place] ? ?Language:No data recorded ? ?Psychomotor Activity  ?Psychomotor Activity:Normal ? ? ?Assets  ?Assets:Communication Skills; Social Support ? ? ?Sleep  ?Sleep:Fair ? ?Number of hours: No data recorded ? ?Nutritional Assessment (For OBS and FBC admissions only) ?Has the patient had a weight loss or gain of 10 pounds or more in the last 3 months?: No ?Has the patient had a decrease in food intake/or appetite?: No ?Does the patient have dental problems?: No ?Does the patient have eating habits or behaviors that may be indicators of an eating disorder including binging or inducing vomiting?: No ?Has the patient recently lost weight without trying?: 0 ?Has the patient been eating poorly because of a decreased appetite?: 0 ?Malnutrition Screening Tool Score: 0 ? ? ? ?Physical Exam: ?Physical Exam ?Vitals and nursing note reviewed.  ?HENT:  ?   Head: Normocephalic.  ?Cardiovascular:  ?   Rate and Rhythm: Normal rate and regular rhythm.  ?Neurological:  ?   Mental Status: She is oriented to person, place, and time.  ?Psychiatric:     ?   Attention and Perception: Attention normal.     ?   Mood and Affect: Mood normal.     ?   Speech: Speech normal.     ?   Behavior: Behavior normal. Behavior is cooperative.     ?   Thought Content: Thought content normal.     ?   Cognition and Memory: Cognition normal.  ? ?Review of Systems  ?Psychiatric/Behavioral:  Positive for depression and suicidal ideas (passive ideaitons). The patient is nervous/anxious  and has insomnia.   ?All other systems reviewed and are negative. ?not currently breastfeeding. There is no height or weight on file to calculate BMI. ? ?Musculoskeletal: ?Strength & Muscle Tone: within normal limits ?Gait & Station: normal ?Patient leans: N/A ? ? ?[facility] M[facility] Discharge  Disposition for Follow up and Recommendations: ?Based on my evaluation the patient does not appear to have an emergency medical condition and can be discharged with resources and follow up care in outpatient services for Medication Management, Partial Hospitalization Program, and Individual Therapy ? ? ?Derrill Center, NP ?04/02/2021, 12:23 PM ? ?

## 2021-04-06 ENCOUNTER — Telehealth (HOSPITAL_COMMUNITY): Payer: Self-pay | Admitting: Professional

## 2021-04-19 ENCOUNTER — Telehealth (HOSPITAL_COMMUNITY): Payer: Self-pay | Admitting: Family Medicine

## 2021-04-19 NOTE — BH Assessment (Signed)
Care Management - [place] Follow Up Discharges  ? ?Writer attempted to make contact with patient today and was unsuccessful.  Writer left a HIPPA compliant voice message.  ? ?Per chart review, patient was provided with outpatient resources and [facility] resources  ? ?

## 2021-05-17 ENCOUNTER — Ambulatory Visit: Payer: Self-pay | Admitting: Urology

## 2021-06-14 NOTE — Telephone Encounter (Signed)
Attempted to schedule.  

## 2021-11-28 ENCOUNTER — Other Ambulatory Visit: Payer: Self-pay | Admitting: *Deleted

## 2021-11-28 DIAGNOSIS — N200 Calculus of kidney: Secondary | ICD-10-CM

## 2021-11-29 ENCOUNTER — Ambulatory Visit: Payer: 59 | Admitting: Urology

## 2022-11-11 IMAGING — CR DG CHEST 2V
2 series · 2 of 2 positions shown · non-contrast
Comparison: August 08, 2019

CLINICAL DATA: Chest pain

EXAM:
CHEST - 2 VIEW

[chest pa]
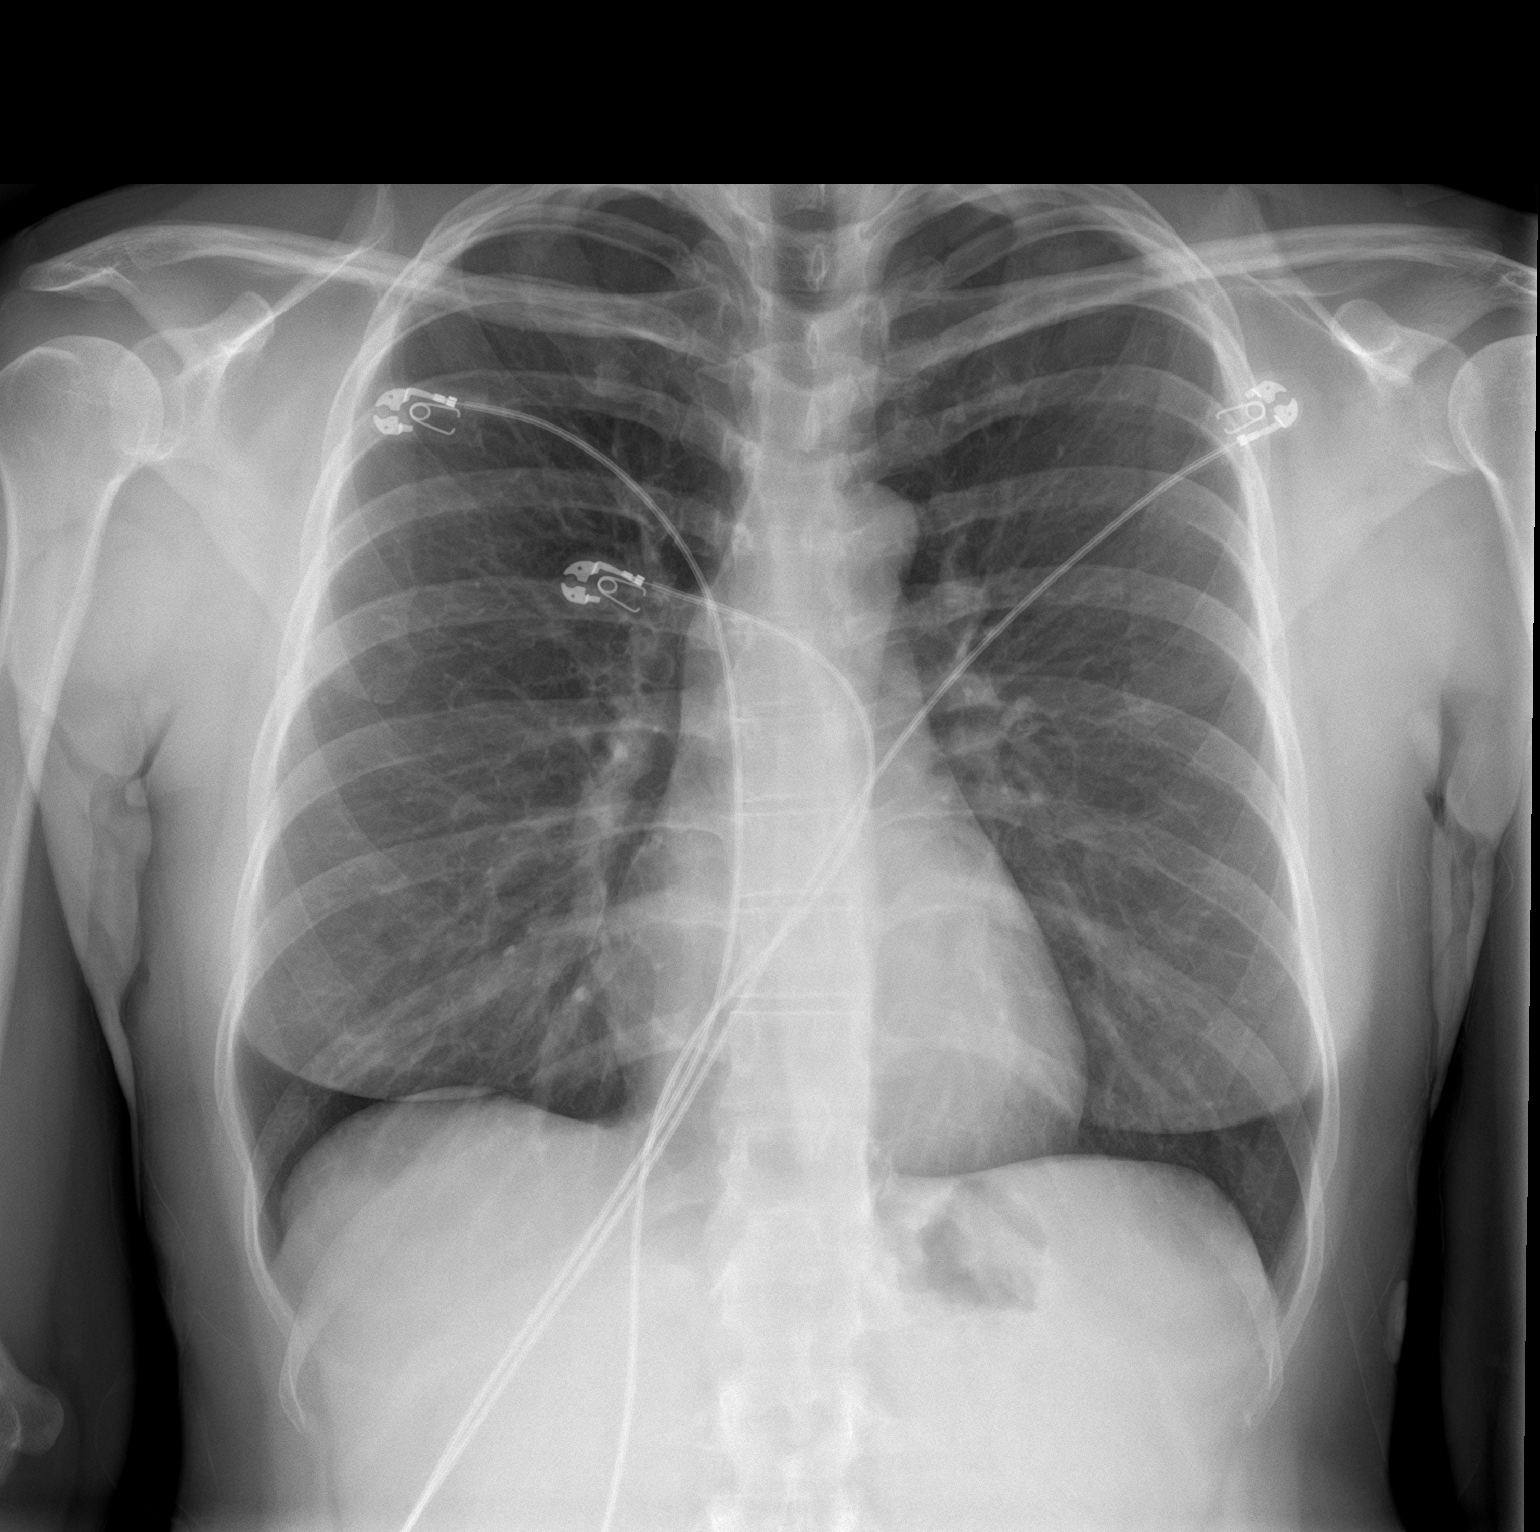

[chest lat]
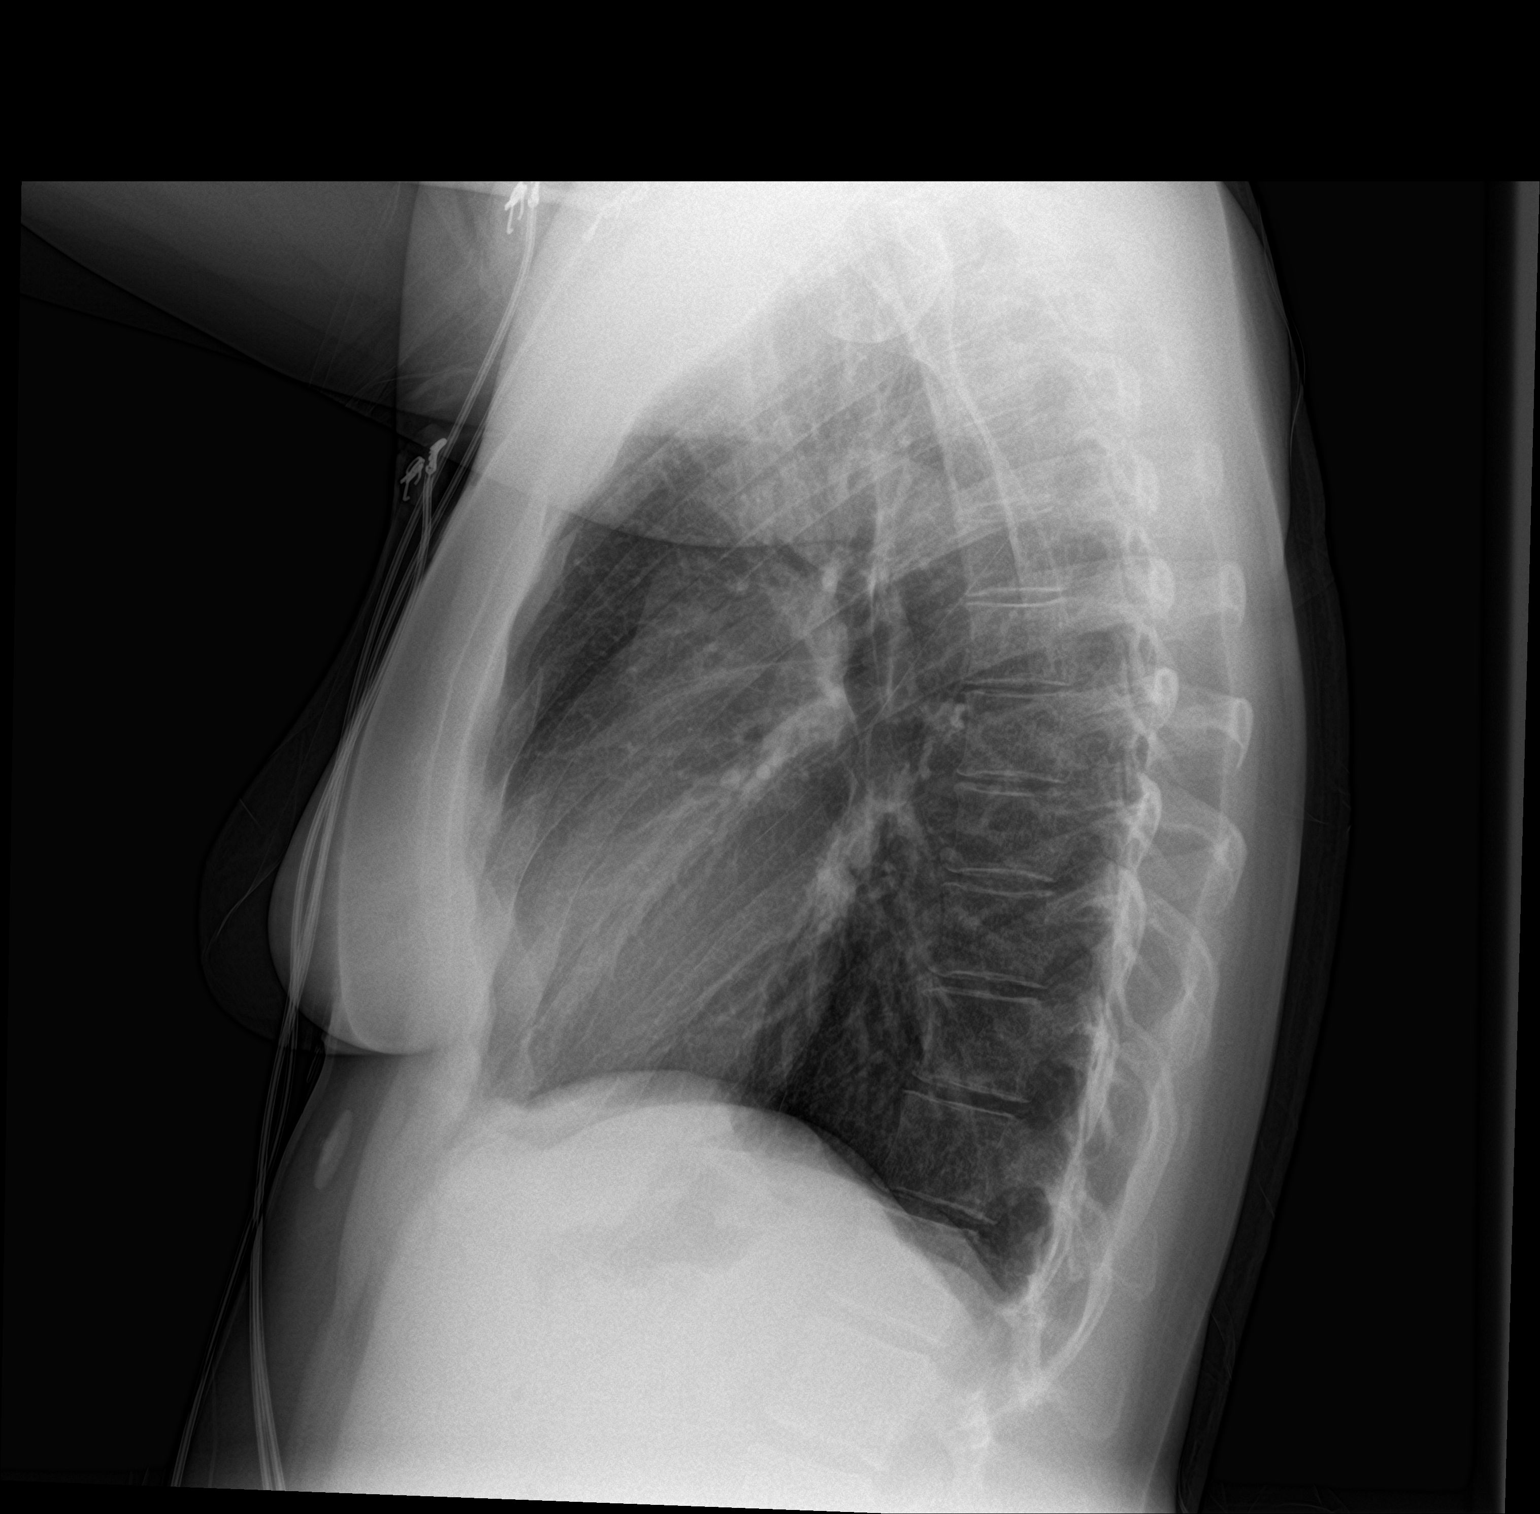

[2 of 2 positions shown; findings below may reference images not displayed]

FINDINGS: Lungs are clear. Heart size and pulmonary vascularity are normal. No
adenopathy. No pneumothorax. No bone lesions.
IMPRESSION: Lungs clear.  Cardiac silhouette within normal limits.

## 2023-03-06 IMAGING — CT CT ABD-PELV W/ CM
2 of 4 series · 15 of 46 positions shown, 17 images · IV contrast (APPLIED)
Comparison: Noncontrast abdominopelvic CT 08/16/2020.

CLINICAL DATA: Right lower quadrant abdominal pain that started
this morning. Appendicitis suspected. History kidney stones.

EXAM:
CT ABDOMEN AND PELVIS WITH CONTRAST
TECHNIQUE: Multidetector CT imaging of the abdomen and pelvis was performed
using the standard protocol following bolus administration of
intravenous contrast.
CONTRAST:  75mL OMNIPAQUE IOHEXOL 350 MG/ML SOLN

[Series 2: routine abd/pel with · axial · 0.80mm/px · z∈[-844,-444]mm · 12 of 92 slices shown, 14 images]
[im 8/92  soft-tissue]
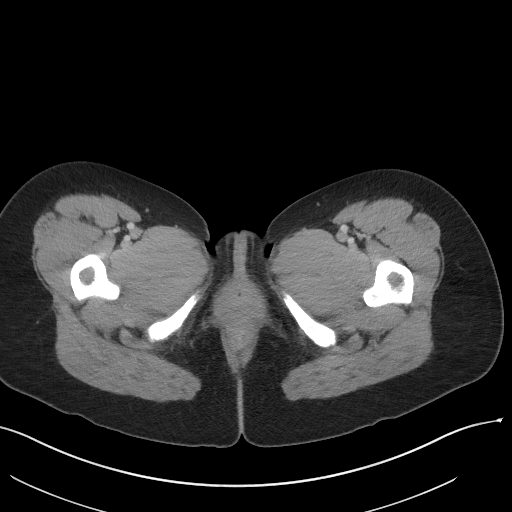
[im 8/92  bone]
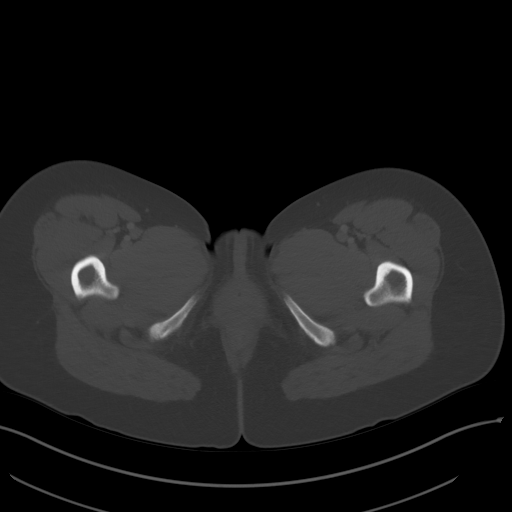
[im 15/92  soft-tissue]
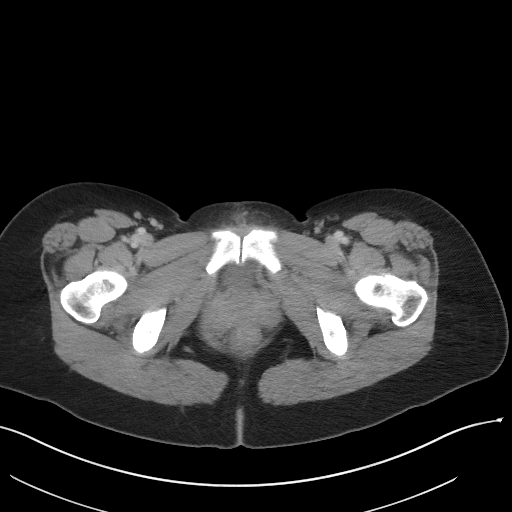
[im 22/92  soft-tissue]
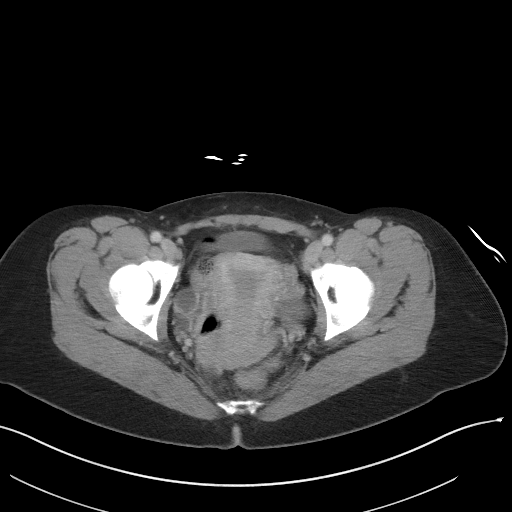
[im 30/92  soft-tissue]
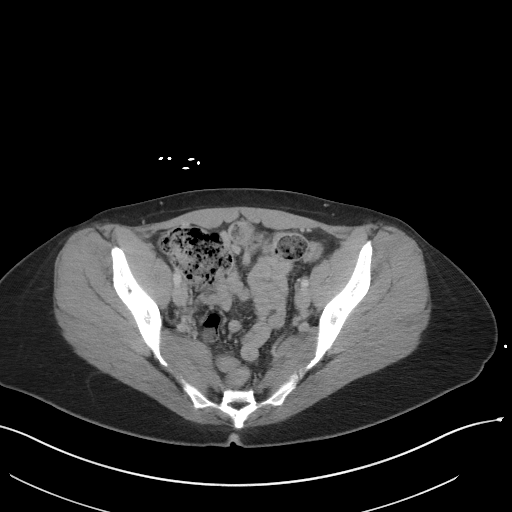
[im 37/92  soft-tissue]
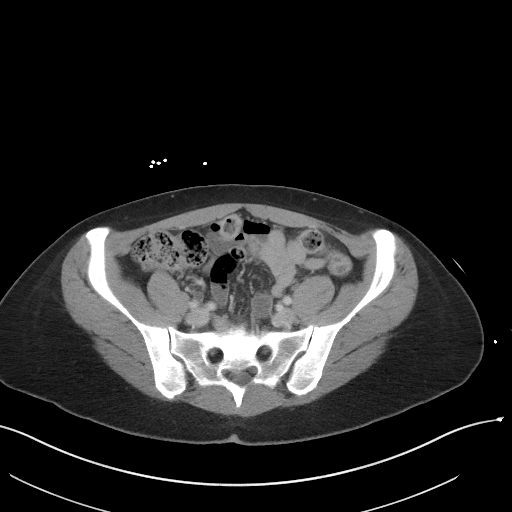
[im 44/92  soft-tissue]
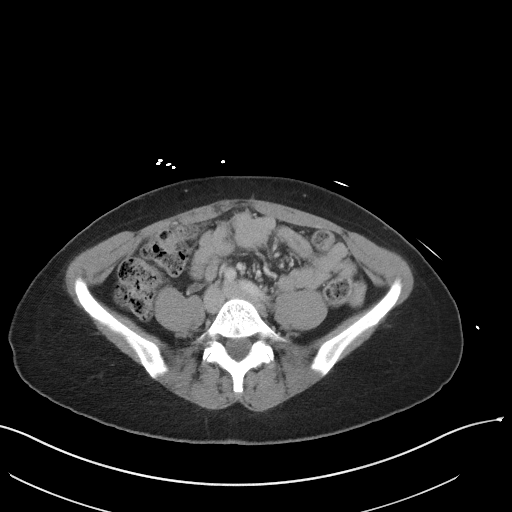
[im 51/92  soft-tissue]
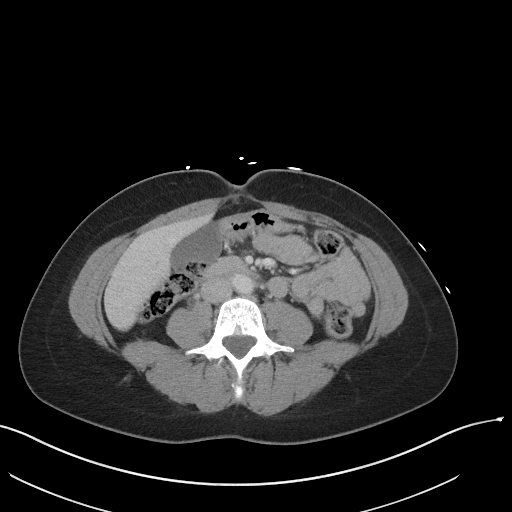
[im 59/92  soft-tissue]
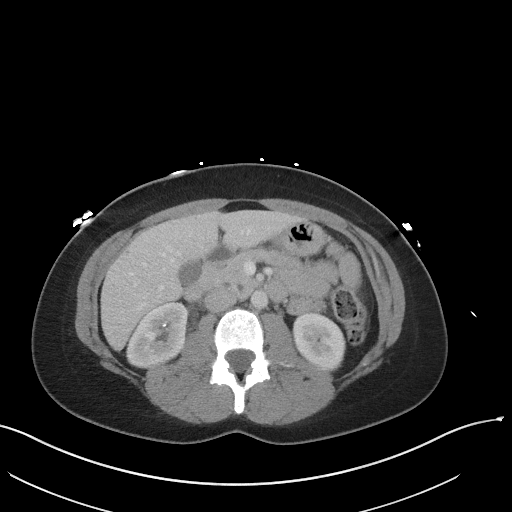
[im 66/92  soft-tissue]
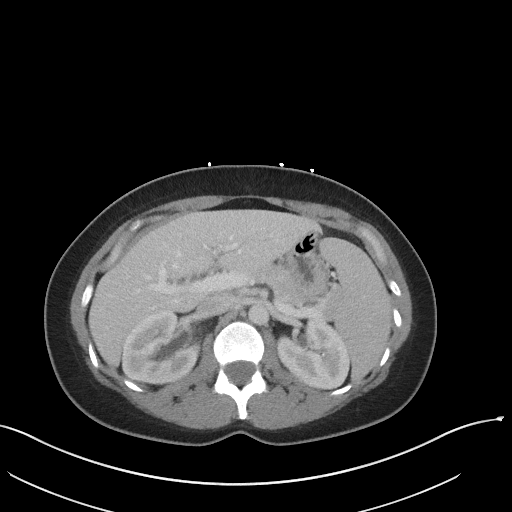
[im 66/92  bone]
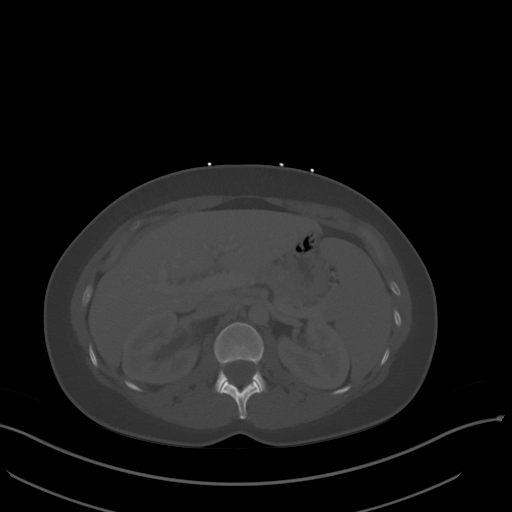
[im 73/92  soft-tissue]
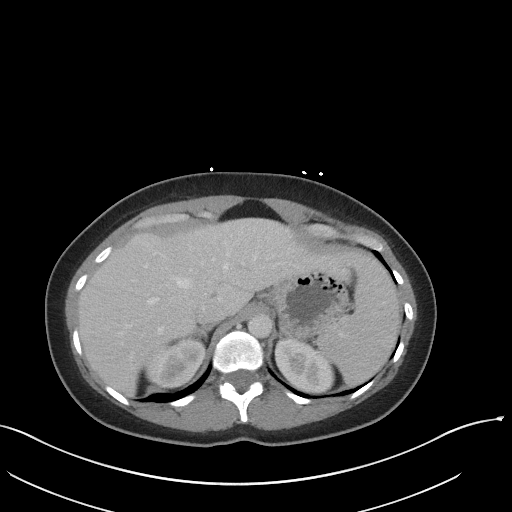
[im 81/92  soft-tissue]
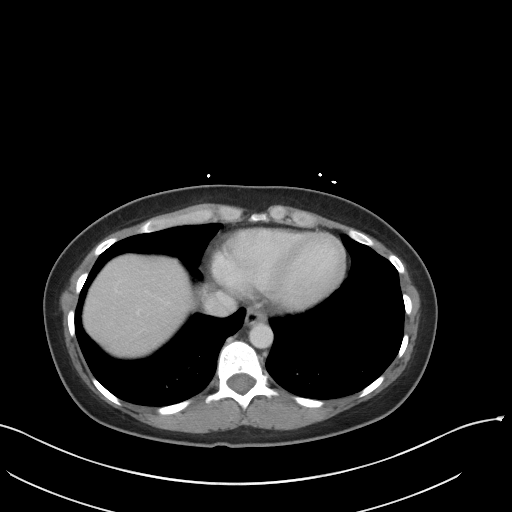
[im 88/92  soft-tissue]
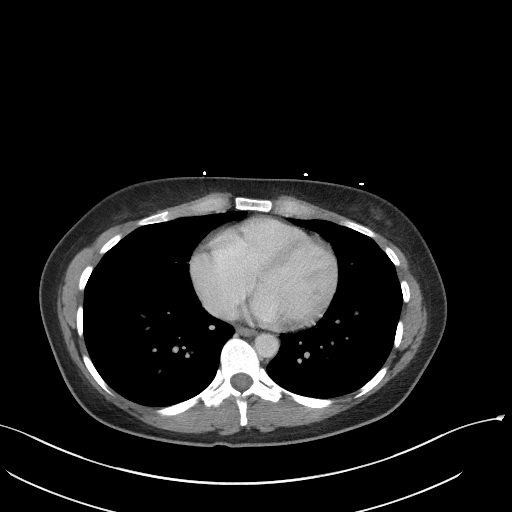

[Series 5: coronal st · coronal · 0.64mm/px · 3 of 82 slices shown]
[im 28/82  soft-tissue]
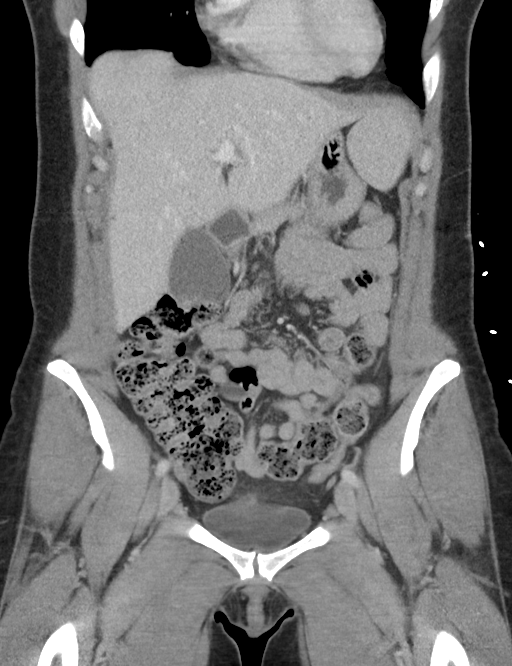
[im 37/82  soft-tissue]
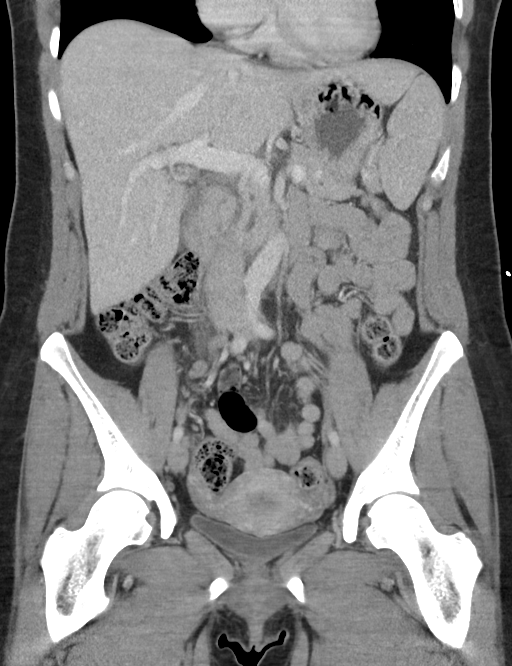
[im 46/82  soft-tissue]
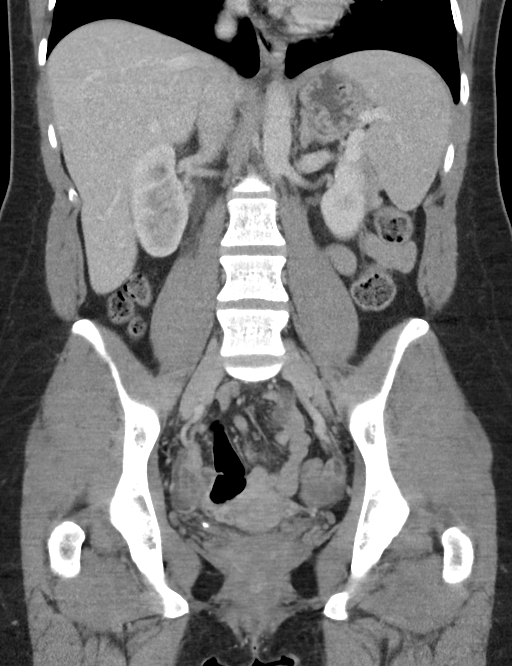

[15 of 46 positions shown; findings below may reference images not displayed]

FINDINGS: Lower chest: Clear lung bases. No significant pleural or pericardial
effusion.

Hepatobiliary: The liver is normal in density without suspicious
focal abnormality. No evidence of gallstones, gallbladder wall
thickening or biliary dilatation.

Pancreas: Unremarkable. No pancreatic ductal dilatation or
surrounding inflammatory changes.

Spleen: Normal in size without focal abnormality.

Adrenals/Urinary Tract: Both adrenal glands appear normal. There are
several nonobstructing renal calculi bilaterally, largest in the
lower pole of the left kidney measuring 4 mm. There is an
obstructing calculus at the right ureteropelvic junction, measuring
6 mm on image 37/2, faintly visible on the scout image. There is an
additional obstructing calculus in the distal right ureter,
measuring 5 mm on image 74/2, partly obscured by the right superior
pubic ramus on the scout image. The right ureter is mildly dilated,
and there is asymmetrically decreased enhancement of the right
kidney. No evidence of renal mass or significant perinephric soft
tissue stranding. No left-sided hydronephrosis. The bladder appears
unremarkable.

Stomach/Bowel: No enteric contrast administered. The stomach appears
unremarkable for its degree of distension. No evidence of bowel wall
thickening, distention or surrounding inflammatory change. The
appendix appears normal. Mildly prominent colonic stool.

Vascular/Lymphatic: There are no enlarged abdominal or pelvic lymph
nodes. No significant vascular findings.

Reproductive: The uterus and ovaries appear normal with small
ovarian follicles bilaterally. Possible additional cystic lesion
posteriorly in the right pelvis on image 69/2, measuring 2.1 cm.
This may reflect an incidental paraovarian or peritoneal cyst. No
suspicious adnexal findings.

Other: No evidence of abdominal wall mass or hernia. No ascites.

Musculoskeletal: No acute or significant osseous findings.
IMPRESSION: 1. Obstructing right ureteral calculi, measuring 6 mm at the
ureteropelvic junction and 5 mm just proximal to the ureterovesical
junction.
2. Additional nonobstructing bilateral renal calculi.
3. No evidence of appendicitis.
